# Patient Record
Sex: Male | Born: 1940
Health system: Southern US, Community
[De-identification: ages and names within clinical notes are randomized; demographics above are authoritative.]

## PROBLEM LIST (undated history)

## (undated) DIAGNOSIS — C801 Malignant (primary) neoplasm, unspecified: Secondary | ICD-10-CM

## (undated) DIAGNOSIS — I10 Essential (primary) hypertension: Secondary | ICD-10-CM

## (undated) DIAGNOSIS — G4730 Sleep apnea, unspecified: Secondary | ICD-10-CM

## (undated) DIAGNOSIS — I4891 Unspecified atrial fibrillation: Secondary | ICD-10-CM

## (undated) DIAGNOSIS — M109 Gout, unspecified: Secondary | ICD-10-CM

## (undated) DIAGNOSIS — K819 Cholecystitis, unspecified: Secondary | ICD-10-CM

## (undated) DIAGNOSIS — E785 Hyperlipidemia, unspecified: Secondary | ICD-10-CM

## (undated) DIAGNOSIS — G629 Polyneuropathy, unspecified: Secondary | ICD-10-CM

## (undated) DIAGNOSIS — N200 Calculus of kidney: Secondary | ICD-10-CM

## (undated) HISTORY — PX: SKIN CANCER EXCISION: SHX779

## (undated) HISTORY — DX: Gout, unspecified: M10.9

## (undated) HISTORY — DX: Unspecified atrial fibrillation: I48.91

## (undated) HISTORY — PX: WISDOM TOOTH EXTRACTION: SHX21

## (undated) HISTORY — DX: Malignant (primary) neoplasm, unspecified: C80.1

## (undated) HISTORY — DX: Calculus of kidney: N20.0

## (undated) HISTORY — DX: Essential (primary) hypertension: I10

## (undated) HISTORY — DX: Hyperlipidemia, unspecified: E78.5

## (undated) HISTORY — DX: Sleep apnea, unspecified: G47.30

## (undated) HISTORY — DX: Polyneuropathy, unspecified: G62.9

---
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
## 1972-12-20 HISTORY — PX: HERNIA REPAIR: SHX51

## 2008-09-09 DIAGNOSIS — M6740 Ganglion, unspecified site: Secondary | ICD-10-CM | POA: Insufficient documentation

## 2011-04-13 DIAGNOSIS — C44211 Basal cell carcinoma of skin of unspecified ear and external auricular canal: Secondary | ICD-10-CM | POA: Insufficient documentation

## 2011-04-13 DIAGNOSIS — K5730 Diverticulosis of large intestine without perforation or abscess without bleeding: Secondary | ICD-10-CM | POA: Insufficient documentation

## 2014-05-14 DIAGNOSIS — D6859 Other primary thrombophilia: Secondary | ICD-10-CM | POA: Insufficient documentation

## 2014-05-14 DIAGNOSIS — H35039 Hypertensive retinopathy, unspecified eye: Secondary | ICD-10-CM | POA: Insufficient documentation

## 2014-05-14 DIAGNOSIS — Z859 Personal history of malignant neoplasm, unspecified: Secondary | ICD-10-CM | POA: Insufficient documentation

## 2014-05-14 DIAGNOSIS — M109 Gout, unspecified: Secondary | ICD-10-CM | POA: Insufficient documentation

## 2014-06-25 ENCOUNTER — Encounter: Payer: Self-pay | Admitting: Neurology

## 2014-06-25 ENCOUNTER — Ambulatory Visit (INDEPENDENT_AMBULATORY_CARE_PROVIDER_SITE_OTHER): Payer: 59 | Admitting: Neurology

## 2014-06-25 ENCOUNTER — Encounter (INDEPENDENT_AMBULATORY_CARE_PROVIDER_SITE_OTHER): Payer: Self-pay

## 2014-06-25 VITALS — BP 181/101 | HR 58 | Temp 97.3°F | Ht 69.0 in | Wt 270.0 lb

## 2014-06-25 DIAGNOSIS — Z7289 Other problems related to lifestyle: Secondary | ICD-10-CM

## 2014-06-25 DIAGNOSIS — E669 Obesity, unspecified: Secondary | ICD-10-CM

## 2014-06-25 DIAGNOSIS — Z789 Other specified health status: Secondary | ICD-10-CM

## 2014-06-25 DIAGNOSIS — G4733 Obstructive sleep apnea (adult) (pediatric): Secondary | ICD-10-CM

## 2014-06-25 DIAGNOSIS — G609 Hereditary and idiopathic neuropathy, unspecified: Secondary | ICD-10-CM

## 2014-06-25 NOTE — Progress Notes (Signed)
Subjective:    Patient ID: Tyler Sparks is a 73 y.o. male.  HPI   Star Age, MD, PhD [facility] [address], [address].O[address] [place], [place] [address]  Dear Dr. Ardeth Perfect,   I saw your patient, Tyler Sparks, upon your kind request in my neurologic clinic today for initial consultation of his sleep disorder as well as neuropathy. The patient is unaccompanied today. As you know, Tyler Sparks is a 73 year old right-handed gentleman with an underlying medical history of A. fib, hypertension, hyperlipidemia, prediabetes, skin cancer, gout, kidney stones, neuropathy in the context of daily alcohol use (2 to 3 shot glasses per day), obesity and hypertensive retinopathy, who was diagnosed with obstructive sleep apnea at least 10 years ago, and has a CPAP machine. He relocated recently from [facility]. He was also diagnosed with peripheral neuropathy over 10 years ago and had workup when he was still in [place]. He had a CPAP in the beginning, for what he describes as severe OSA. He has been using a F&F nasal mask. He has been on a VPAP auto 25 from [place], set at 5-9 with PS of 4.  He has had paresthesias and numbness in his feet up to the ankles for the past 10 years. He has no pain, but reports that his balance is affected.  He has not had an EMG/NCV. He does have a FHx of neuropathy in his mother and a cousin, he has no siblings.  His has not been re-evaluated for OSA in over 10 years. His mask has not been replaced in 1 year. I reviewed the patient's CPAP compliance data from 12/25/13 to 06/24/14, which is a total of 182 days, during which time the patient used CPAP every day except for 2 days. The average usage for all days was 6 hours and 43 minutes. The percent used days greater than 4 hours was 98%, indicating excellent compliance. The residual AHI was 17.4 per hour, indicating an inadequate treatment setting of 5 cm of min EPAP and 25 cm of max IPAP  with PS of 4. Air leak from the mask was acceptable.  His typical bedtime is reported to be around 11 to 12 MN and usual wake time is around 6:30 to 7 AM. Sleep onset typically occurs within 15 minutes. He reports feeling marginally rested upon awakening. He does not have nocturia on a night to night basis. He does not have AM HAs.   He reports excessive daytime somnolence (EDS) and His Epworth Sleepiness Score (ESS) is 5/24 today. He has not fallen asleep while driving. The patient has not been taking a scheduled nap.  His wife sleeps in a separate bedroom. The patient has not noted any RLS symptoms and is not known to kick while asleep or before falling asleep. There is no family history of RLS or OSA. He denies cataplexy, sleep paralysis, hypnagogic or hypnopompic hallucinations, or sleep attacks. He does not report any vivid dreams, nightmares, dream enactments, or parasomnias, such as sleep talking or sleep walking.  He consumes 0 caffeinated beverages per day, There is a TV in the bedroom and usually it is not on at night.   His Past Medical History Is Significant For: Past Medical History  Diagnosis Date  . HTN (hypertension)   . Hyperlipidemia   . Atrial fibrillation   . Cancer     His Past Surgical History Is Significant For: Past Surgical History  Procedure Laterality Date  . Skin cancer excision  2008  . Hernia repair  1974  . Tonsilectomy/adenoidectomy with myringotomy  1950    His Family History Is Significant For: Family History  Problem Relation Age of Onset  . Heart failure Mother   . Heart failure Father     His Social History Is Significant For: History   Social History  . Marital Status: Married    Spouse Name: N/A    Number of Children: N/A  . Years of Education: N/A   Social History Main Topics  . Smoking status: Former Smoker    Quit date: 12/26/1973  . Smokeless tobacco: None  . Alcohol Use: 7.0 oz/week    14 drink(s) per week  . Drug Use: No  .  Sexual Activity: None   Other Topics Concern  . None   Social History Narrative  . None    His Allergies Are:  Allergies  Allergen Reactions  . Eggs Or Egg-Derived Products Hives, Itching and Swelling  . Peanuts [Peanut Oil]   :   His Current Medications Are:  Outpatient Encounter Prescriptions as of 06/25/2014  Medication Sig  . allopurinol (ZYLOPRIM) 300 MG tablet Take 300 mg by mouth daily.  Marland Kitchen aspirin 81 MG tablet Take 81 mg by mouth daily.  Marland Kitchen atenolol (TENORMIN) 25 MG tablet Take 25 mg by mouth daily.  Marland Kitchen atorvastatin (LIPITOR) 10 MG tablet Take 10 mg by mouth daily.  Marland Kitchen co-enzyme Q-10 50 MG capsule Take 50 mg by mouth daily.  . colchicine 0.6 MG tablet Take 0.6 mg by mouth as needed.  . diltiazem (CARDIZEM CD) 180 MG 24 hr capsule Take 180 mg by mouth daily.  Marland Kitchen lisinopril (PRINIVIL,ZESTRIL) 20 MG tablet Take 20 mg by mouth daily.  . Misc Natural Products (OSTEO BI-FLEX JOINT SHIELD PO) Take 2 tablets by mouth daily.  . Multiple Vitamin (MULTIVITAMIN) tablet Take 1 tablet by mouth daily.  . Omega-3 Fatty Acids (FISH OIL) 1000 MG CAPS Take 1 capsule by mouth daily.  . valsartan-hydrochlorothiazide (DIOVAN-HCT) 320-12.5 MG per tablet Take 1 tablet by mouth daily.  . vitamin C (ASCORBIC ACID) 500 MG tablet Take 500 mg by mouth daily.  . vitamin E 1000 UNIT capsule Take 1,000 Units by mouth daily.  :  Review of Systems:  Out of a complete 14 point review of systems, all are reviewed and negative with the exception of these symptoms as listed below:   Review of Systems  Constitutional: Negative.   HENT: Negative.   Eyes: Negative.   Respiratory: Negative.   Cardiovascular: Negative.   Gastrointestinal: Negative.   Endocrine: Negative.   Genitourinary: Negative.   Musculoskeletal: Negative.   Skin: Negative.   Allergic/Immunologic: Positive for environmental allergies and food allergies.  Neurological: Negative.   Hematological: Negative.   Psychiatric/Behavioral:  Positive for sleep disturbance (snoring).    Objective:  Neurologic Exam  Physical Exam Physical Examination:   Filed Vitals:   06/25/14 1017  BP: 181/101  Pulse: 58  Temp: 97.3 F (36.3 C)    General Examination: The patient is a very pleasant 73 y.o. male in no acute distress. He appears well-developed and well-nourished and well groomed. He is obese.   HEENT: Normocephalic, atraumatic, pupils are equal, round and reactive to light and accommodation. Funduscopic exam is normal with sharp disc margins noted. Extraocular tracking is good without limitation to gaze excursion or nystagmus noted. Normal smooth pursuit is noted. Hearing is grossly intact. Tympanic membranes are clear bilaterally. Face is symmetric with normal facial animation and  normal facial sensation. Speech is clear with no dysarthria noted. There is no hypophonia. There is no lip, neck/head, jaw or voice tremor. Neck is supple with full range of passive and active motion. There are no carotid bruits on auscultation. Oropharynx exam reveals: moderate mouth dryness, adequate dental hygiene and moderate airway crowding, due to larger tone and narrow airway entry. Mallampati is class III. Tongue protrudes centrally and palate elevates symmetrically. Tonsils are absent. Neck size is 18 inches. He has a Mild overbite. Nasal inspection reveals no significant nasal mucosal bogginess or redness and no septal deviation. He has some scars on his nasal bridge.  Chest: Clear to auscultation without wheezing, rhonchi or crackles noted.  Heart: S1+S2+0, regular and normal without murmurs, rubs or gallops noted.   Abdomen: Soft, non-tender and non-distended with normal bowel sounds appreciated on auscultation.  Extremities: There is 1+ pitting edema in the distal lower extremities bilaterally. Pedal pulses are intact.  Skin: Warm and dry without trophic changes noted. There are no varicose veins.  Musculoskeletal: exam reveals no  obvious joint deformities, tenderness or joint swelling or erythema.   Neurologically:  Mental status: The patient is awake, alert and oriented in all 4 spheres. His immediate and remote memory, attention, language skills and fund of knowledge are appropriate. There is no evidence of aphasia, agnosia, apraxia or anomia. Speech is clear with normal prosody and enunciation. Thought process is linear. Mood is normal and affect is normal.  Cranial nerves II - XII are as described above under HEENT exam. In addition: shoulder shrug is normal with equal shoulder height noted. Motor exam: Normal bulk, strength and tone is noted. There is no drift, tremor or rebound. Romberg is showing significant swaying but no corrective steps. Reflexes are 2+ in the upper extremities, 1+ in the knees and absent in the ankles. Babinski: Toes are flexor bilaterally. Fine motor skills and coordination: intact with normal finger taps, normal hand movements, normal rapid alternating patting, normal foot taps and normal foot agility.  Cerebellar testing: No dysmetria or intention tremor on finger to nose testing. Heel to shin is unremarkable bilaterally. There is no truncal or gait ataxia.  Sensory exam: intact to light touch, pinprick, vibration, temperature sense in the upper extremities but he has decreased sensation to light touch and particular pinprick and vibration sense in the distal lower extremities symmetrically up to distal one third of his shins,.  Gait, station and balance: He stands easily. No veering to one side is noted. No leaning to one side is noted. Posture is age-appropriate and stance is slightly wide-based. Gait shows normal stride length and normal pace. No problems turning are noted. He turns en bloc. Tandem walk is  Not possible  without corrective steps. He has difficulty with heel stance but has normal toe stance.               Assessment and Plan:  In summary, Tyler Sparks is a very pleasant 73  y.o.-year old male with an underlying medical history of A. fib, hypertension, hyperlipidemia, prediabetes, skin cancer, gout, kidney stones, neuropathy in the context of daily alcohol use (2 to 3 shot glasses per day), obesity and hypertensive retinopathy, who was diagnosed with obstructive sleep apnea at least 10 years ago, and has a CPAP machine. with a history and physical exam concerning for obstructive sleep apnea (OSA). He is currently on an auto VPAP but has residual significant sleep apnea. He has been compliant with the usage of his PAP  machine. In addition, he has evidence of peripheral neuropathy, this could be due to alcohol use. I've talked to him at length about this. Alcohol can also cause balance problems in the context of cerebellar problems and cerebellar degeneration. We will consider a brain MRI down the [place]. As of right now we will do additional blood work to look for treatable causes of neuropathy an EMG nerve conduction testing. Furthermore, he would benefit from a reevaluation with a sleep study and adjustment of treatment settings. I had a long chat with the patient about my findings and the diagnosis of OSA, its prognosis and treatment options. We talked about medical treatments, surgical interventions and non-pharmacological approaches. I explained in particular the risks and ramifications of untreated moderate to severe OSA, especially with respect to developing cardiovascular disease down the Road, including congestive heart failure, difficult to treat hypertension, cardiac arrhythmias, or stroke. Even type 2 diabetes has, in part, been linked to untreated OSA. Symptoms of untreated OSA include daytime sleepiness, memory problems, mood irritability and mood disorder such as depression and anxiety, lack of energy, as well as recurrent headaches, especially morning headaches. We talked about EtOH cessation and trying to maintain a healthy lifestyle in general, as well as the importance  of weight control. I encouraged the patient to eat healthy, exercise daily and keep well hydrated, to keep a scheduled bedtime and wake time routine, to not skip any meals and eat healthy snacks in between meals. I advised the patient not to drive when feeling sleepy. I answered all his questions today and the patient was in agreement. I would like to see him back after the tests are completed and encouraged him to call with any interim questions, concerns, problems or updates.   Thank you very much for allowing me to participate in the care of this nice patient. If I can be of any further assistance to you please do not hesitate to call me at [phone].  Sincerely,   Star Age, MD, PhD

## 2014-06-25 NOTE — Patient Instructions (Signed)
We will do further workup for your neuropathy.  Please reduce and eliminate your alcohol use as it may be the cause of your balance problem and your neuropathy.  Based on your symptoms you will benefit from re-evaluation of your obstructive sleep apnea or OSA, and I think we should proceed with a sleep study to determine whether you do or do not have OSA and how severe it is. If you have more than mild OSA, I want you to consider treatment with CPAP. Please remember, the risks and ramifications of moderate to severe obstructive sleep apnea or OSA are: Cardiovascular disease, including congestive heart failure, stroke, difficult to control hypertension, arrhythmias, and even type 2 diabetes has been linked to untreated OSA. Sleep apnea causes disruption of sleep and sleep deprivation in most cases, which, in turn, can cause recurrent headaches, problems with memory, mood, concentration, focus, and vigilance. Most people with untreated sleep apnea report excessive daytime sleepiness, which can affect their ability to drive. Please do not drive if you feel sleepy.  I will see you back after your sleep study to go over the test results and where to go from there. We will call you after your sleep study and to set up an appointment at the time.

## 2014-06-26 ENCOUNTER — Ambulatory Visit (INDEPENDENT_AMBULATORY_CARE_PROVIDER_SITE_OTHER): Payer: 59 | Admitting: Cardiovascular Disease

## 2014-06-26 ENCOUNTER — Encounter: Payer: Self-pay | Admitting: Cardiovascular Disease

## 2014-06-26 VITALS — BP 190/105 | HR 63 | Resp 12 | Ht 69.0 in | Wt 270.0 lb

## 2014-06-26 DIAGNOSIS — I4891 Unspecified atrial fibrillation: Secondary | ICD-10-CM

## 2014-06-26 DIAGNOSIS — I480 Paroxysmal atrial fibrillation: Secondary | ICD-10-CM

## 2014-06-26 NOTE — Patient Instructions (Signed)
Your physician wants you to follow-up in:  12 months.  You will receive a reminder letter in the mail two months in advance. If you don't receive a letter, please call our office to schedule the follow-up appointment.   

## 2014-06-26 NOTE — Progress Notes (Signed)
History of Present Illness: 73 yo male with history of paroxysmal atrial fibrillation, HTN, HLD, gout, OSA here today to establish cardiology care. He has recently moved to [place] to be near family. He had onset of atrial fib with RVR in 2008 and was converted to sinus with IV diltiazem. He has maintained sinus since then on po Cardizem. He has not been on long term anticoagulation (CHADSVASC 2). He has been followed by cardiology in [place].   Primary Care Physician: Velna Hatchet   Past Medical History  Diagnosis Date  . HTN (hypertension)   . Hyperlipidemia   . Atrial fibrillation     2008  . Cancer     Past Surgical History  Procedure Laterality Date  . Skin cancer excision  2008  . Hernia repair  1974  . Tonsilectomy/adenoidectomy with myringotomy  1950    Current Outpatient Prescriptions  Medication Sig Dispense Refill  . allopurinol (ZYLOPRIM) 300 MG tablet Take 300 mg by mouth daily.      Marland Kitchen aspirin 81 MG tablet Take 81 mg by mouth daily.      Marland Kitchen atenolol (TENORMIN) 25 MG tablet Take 25 mg by mouth daily.      Marland Kitchen atorvastatin (LIPITOR) 10 MG tablet Take 10 mg by mouth daily.      Marland Kitchen co-enzyme Q-10 50 MG capsule Take 50 mg by mouth daily.      . colchicine 0.6 MG tablet Take 0.6 mg by mouth as needed.      . diltiazem (CARDIZEM CD) 180 MG 24 hr capsule Take 180 mg by mouth daily.      Marland Kitchen lisinopril (PRINIVIL,ZESTRIL) 20 MG tablet Take 20 mg by mouth daily.      . Misc Natural Products (OSTEO BI-FLEX JOINT SHIELD PO) Take 2 tablets by mouth daily.      . Multiple Vitamin (MULTIVITAMIN) tablet Take 1 tablet by mouth daily.      . Omega-3 Fatty Acids (FISH OIL) 1000 MG CAPS Take 1 capsule by mouth daily.      . valsartan-hydrochlorothiazide (DIOVAN-HCT) 320-12.5 MG per tablet Take 1 tablet by mouth daily.      . vitamin C (ASCORBIC ACID) 500 MG tablet Take 500 mg by mouth daily.      . vitamin E 1000 UNIT capsule Take 1,000 Units by mouth daily.       No current  facility-administered medications for this visit.    Allergies  Allergen Reactions  . Eggs Or Egg-Derived Products Hives, Itching and Swelling  . Peanuts [Peanut Oil]     History   Social History  . Marital Status: Married    Spouse Name: N/A    Number of Children: N/A  . Years of Education: N/A   Occupational History  . Not on file.   Social History Main Topics  . Smoking status: Former Smoker    Quit date: 12/26/1973  . Smokeless tobacco: Not on file  . Alcohol Use: 7.0 oz/week    14 drink(s) per week  . Drug Use: No  . Sexual Activity: Not on file   Other Topics Concern  . Not on file   Social History Narrative  . No narrative on file    Family History  Problem Relation Age of Onset  . Heart failure Mother   . Heart failure Father     Review of Systems:  As stated in the HPI and otherwise negative.   BP 208/106  Pulse 63  Ht  5\' 9"  (1.753 m)  Wt 270 lb (122.471 kg)  BMI 39.85 kg/m2  Physical Examination: General: Well developed, well nourished, NAD HEENT: OP clear, mucus membranes moist SKIN: warm, dry. No rashes. Neuro: No focal deficits Musculoskeletal: Muscle strength 5/5 all ext Psychiatric: Mood and affect normal Neck: No JVD, no carotid bruits, no thyromegaly, no lymphadenopathy. Lungs:Clear bilaterally, no wheezes, rhonci, crackles Cardiovascular: Regular rate and rhythm. No murmurs, gallops or rubs. Abdomen:Soft. Bowel sounds present. Non-tender.  Extremities: No lower extremity edema. Pulses are 2 + in the bilateral DP/PT.  EKG: NSR, rate 63 bpm.   Assessment and Plan:   1. Paroxysmal atrial fibrillation: He has had no recurrence of atrial fib since 2008. Only documented episode. No palpitations. His CHADSVASC score is 2. He remains in sinus on atenolol and diltiazem. I agree that he does not need anti-coagulation unless he begins to have recurrent episodes of atrial fib. NO changes today. Will discuss arranging at echo at f/u visit.

## 2014-06-27 LAB — IFE AND PE, SERUM
ALPHA 1: 0.2 g/dL (ref 0.1–0.4)
ALPHA2 GLOB SERPL ELPH-MCNC: 0.7 g/dL (ref 0.4–1.2)
Albumin SerPl Elph-Mcnc: 3.4 g/dL (ref 3.2–5.6)
Albumin/Glob SerPl: 1.4 (ref 0.7–2.0)
B-Globulin SerPl Elph-Mcnc: 1 g/dL (ref 0.6–1.3)
Gamma Glob SerPl Elph-Mcnc: 0.8 g/dL (ref 0.5–1.6)
Globulin, Total: 2.6 g/dL (ref 2.0–4.5)
IGM (IMMUNOGLOBULIN M), SRM: 45 mg/dL (ref 40–230)
IgA/Immunoglobulin A, Serum: 195 mg/dL (ref 91–414)
IgG (Immunoglobin G), Serum: 685 mg/dL — ABNORMAL LOW (ref 700–1600)

## 2014-06-27 LAB — CBC WITH DIFFERENTIAL
BASOS ABS: 0 10*3/uL (ref 0.0–0.2)
Basos: 0 %
EOS ABS: 0.2 10*3/uL (ref 0.0–0.4)
Eos: 4 %
HCT: 40.4 % (ref 37.5–51.0)
HEMOGLOBIN: 13.6 g/dL (ref 12.6–17.7)
IMMATURE GRANS (ABS): 0 10*3/uL (ref 0.0–0.1)
Immature Granulocytes: 0 %
LYMPHS ABS: 1.4 10*3/uL (ref 0.7–3.1)
Lymphs: 30 %
MCH: 31.1 pg (ref 26.6–33.0)
MCHC: 33.7 g/dL (ref 31.5–35.7)
MCV: 92 fL (ref 79–97)
MONOS ABS: 0.5 10*3/uL (ref 0.1–0.9)
Monocytes: 10 %
Neutrophils Absolute: 2.6 10*3/uL (ref 1.4–7.0)
Neutrophils Relative %: 56 %
Platelets: 202 10*3/uL (ref 150–379)
RBC: 4.37 x10E6/uL (ref 4.14–5.80)
RDW: 14.4 % (ref 12.3–15.4)
WBC: 4.7 10*3/uL (ref 3.4–10.8)

## 2014-06-27 LAB — COMPREHENSIVE METABOLIC PANEL
A/G RATIO: 2.3 (ref 1.1–2.5)
ALBUMIN: 4.2 g/dL (ref 3.5–4.8)
ALT: 24 IU/L (ref 0–44)
AST: 15 IU/L (ref 0–40)
Alkaline Phosphatase: 66 IU/L (ref 39–117)
BILIRUBIN TOTAL: 0.5 mg/dL (ref 0.0–1.2)
BUN/Creatinine Ratio: 19 (ref 10–22)
BUN: 20 mg/dL (ref 8–27)
CALCIUM: 9.2 mg/dL (ref 8.6–10.2)
CO2: 26 mmol/L (ref 18–29)
CREATININE: 1.06 mg/dL (ref 0.76–1.27)
Chloride: 102 mmol/L (ref 97–108)
GFR, EST AFRICAN AMERICAN: 81 mL/min/{1.73_m2} (ref >59)
GFR, EST NON AFRICAN AMERICAN: 70 mL/min/{1.73_m2} (ref >59)
GLOBULIN, TOTAL: 1.8 g/dL (ref 1.5–4.5)
GLUCOSE: 109 mg/dL — AB (ref 65–99)
POTASSIUM: 4 mmol/L (ref 3.5–5.2)
Sodium: 144 mmol/L (ref 134–144)
TOTAL PROTEIN: 6 g/dL (ref 6.0–8.5)

## 2014-06-27 LAB — ANA W/REFLEX: Anti Nuclear Antibody(ANA): NEGATIVE

## 2014-06-27 LAB — METHYLMALONIC ACID, SERUM: METHYLMALONIC ACID: 284 nmol/L (ref 0–378)

## 2014-06-27 LAB — VITAMIN D 25 HYDROXY (VIT D DEFICIENCY, FRACTURES): Vit D, 25-Hydroxy: 48.3 ng/mL (ref 30.0–100.0)

## 2014-06-27 LAB — C-REACTIVE PROTEIN: CRP: 3 mg/L (ref 0.0–4.9)

## 2014-06-27 LAB — VITAMIN B1, WHOLE BLOOD: THIAMINE: 211.3 nmol/L — AB (ref 66.5–200.0)

## 2014-06-27 LAB — HEAVY METALS PROFILE II, BLOOD
Arsenic: 4 ug/L (ref 2–23)
CADMIUM: NOT DETECTED ug/L (ref 0.0–1.2)
LEAD, BLOOD: 1 ug/dL (ref 0–19)
Mercury: 3.2 ug/L (ref 0.0–14.9)

## 2014-06-27 LAB — SYPHILIS: RPR W/REFLEX TO RPR TITER AND TREPONEMAL ANTIBODIES, TRADITIONAL SCREENING AND DIAGNOSIS ALGORITHM: RPR: NONREACTIVE

## 2014-06-27 LAB — HOMOCYSTEINE: Homocysteine: 7.5 umol/L (ref 0.0–15.0)

## 2014-06-27 LAB — TSH: TSH: 2.6 u[IU]/mL (ref 0.450–4.500)

## 2014-06-27 LAB — RHEUMATOID FACTOR: Rhuematoid fact SerPl-aCnc: 49 IU/mL — ABNORMAL HIGH (ref 0.0–13.9)

## 2014-06-27 LAB — HGB A1C W/O EAG: Hgb A1c MFr Bld: 6 % — ABNORMAL HIGH (ref 4.8–5.6)

## 2014-06-27 NOTE — Progress Notes (Signed)
Quick Note:  Please call patient about his blood test results. His blood work looked normal for the most part. His glucose level was borderline elevated and so was his hemoglobin A1c which is a diabetes marker. He does have a history of prediabetes and this just underlines the possibility that he is borderline diabetic or at risk for diabetes but probably does not have frank diabetes. Otherwise, there was one rheumatological factor increase which is rheumatoid factor. This in isolation in the absence of any other rheumatological symptoms or lab test results positive for inflammation is not something we need to worry about acutely. His heavy metals were fine and his vitamin D level as well as his thyroid screen were normal. If he has significant issues with joint pain especially in the context of a family history of rheumatoid arthritis we can pursue his rheumatological workup further if need be.  Star Age, MD, PhD [facility]   ______

## 2014-07-01 NOTE — Progress Notes (Signed)
Quick Note:  I spoke to pt and relayed the results of labs. He did see in mychart already. Had question about RA factor, and I relayed note per Dr.Athar. He stated that he has seen rheumatologist in [place], [place] last 12/2013 due to similar test results done by his pcp in 11/2013. To let Dr.Athar know. Will forward to her. ______

## 2014-07-20 HISTORY — PX: ARM SKIN LESION BIOPSY / EXCISION: SUR471

## 2014-07-21 ENCOUNTER — Ambulatory Visit (INDEPENDENT_AMBULATORY_CARE_PROVIDER_SITE_OTHER): Payer: 59 | Admitting: Neurology

## 2014-07-21 DIAGNOSIS — G479 Sleep disorder, unspecified: Secondary | ICD-10-CM

## 2014-07-21 DIAGNOSIS — Z7289 Other problems related to lifestyle: Secondary | ICD-10-CM

## 2014-07-21 DIAGNOSIS — G4733 Obstructive sleep apnea (adult) (pediatric): Secondary | ICD-10-CM

## 2014-07-21 DIAGNOSIS — E669 Obesity, unspecified: Secondary | ICD-10-CM

## 2014-07-21 DIAGNOSIS — Z789 Other specified health status: Secondary | ICD-10-CM

## 2014-07-21 DIAGNOSIS — G609 Hereditary and idiopathic neuropathy, unspecified: Secondary | ICD-10-CM

## 2014-07-21 DIAGNOSIS — G4761 Periodic limb movement disorder: Secondary | ICD-10-CM

## 2014-07-22 ENCOUNTER — Encounter (INDEPENDENT_AMBULATORY_CARE_PROVIDER_SITE_OTHER): Payer: 59 | Admitting: Radiology

## 2014-07-22 ENCOUNTER — Ambulatory Visit (INDEPENDENT_AMBULATORY_CARE_PROVIDER_SITE_OTHER): Payer: 59 | Admitting: Neurology

## 2014-07-22 DIAGNOSIS — Z0289 Encounter for other administrative examinations: Secondary | ICD-10-CM

## 2014-07-22 DIAGNOSIS — G4733 Obstructive sleep apnea (adult) (pediatric): Secondary | ICD-10-CM

## 2014-07-22 DIAGNOSIS — G629 Polyneuropathy, unspecified: Secondary | ICD-10-CM

## 2014-07-22 DIAGNOSIS — G609 Hereditary and idiopathic neuropathy, unspecified: Secondary | ICD-10-CM

## 2014-07-22 DIAGNOSIS — Z7289 Other problems related to lifestyle: Secondary | ICD-10-CM

## 2014-07-22 DIAGNOSIS — E669 Obesity, unspecified: Secondary | ICD-10-CM

## 2014-07-22 DIAGNOSIS — Z789 Other specified health status: Secondary | ICD-10-CM

## 2014-07-22 NOTE — Sleep Study (Signed)
See media tab for full report  

## 2014-07-22 NOTE — Procedures (Signed)
   NCS (NERVE CONDUCTION STUDY) WITH EMG (ELECTROMYOGRAPHY) REPORT   STUDY DATE: August 3rd 2015 PATIENT NAME: Tyler Sparks DOB: [birth year] MRN: [id number]    TECHNOLOGIST: Towana Badger ELECTROMYOGRAPHER: Marcial Pacas M.D.  CLINICAL INFORMATION:   73 years old male, with past medical history of obesity, prediabetes, presenting with pain years history of bilateral feet paresthesia, stocking sensation  On examination, bilateral lower extremity motor strength was normal, length dependent decreased pinprick to above ankle level, absent vibratory sensation at toes  FINDINGS: NERVE CONDUCTION STUDY: Bilateral sural sensory responses showed mildly decreased SNAP amplitude. Bilateral peroneal, left tibial motor responses showed mildly decreased CMAP amplitude, with normal distal latency, mildly decreased conduction velocity.  Right tibial motor responses showed normal CMAP amplitude, with mild slow conduction velocity.   Left median, ulnar sensory and motor responses were normal.  NEEDLE ELECTROMYOGRAPHY: Selected needle examination was performed at right lower extremity muscles, right lumbosacral paraspinal muscles.  Right abductor hallucis longus: Increased insertion activity, 1 plus spontaneous activity, mild enlarged complex motor unit potential, with mildly decreased recruitment patterns.  Needle examination of right tibialis anterior, tibialis posterior, peroneal longus, medial gastrocnemius, vastus lateralis was normal  There was no spontaneous activity at right lumbosacral paraspinal muscles, right L4, L5, S1.  IMPRESSION:   This is an abnormal study. There is electrodiagnostic evidence of mild length dependent axonal peripheral neuropathy. There was no evidence of right lumbosacral radiculopathy   INTERPRETING PHYSICIAN:   Marcial Pacas M.D. Ph.D. [facility] [address], [place], [place] [address] [phone]

## 2014-07-24 NOTE — Progress Notes (Signed)
Quick Note:  EMG/NCV Confirms mild neuropathy. No other abnormalities were found. Please inform patient. Star Age, MD, PhD [facility]  ______

## 2014-08-02 ENCOUNTER — Telehealth: Payer: Self-pay | Admitting: Neurology

## 2014-08-02 DIAGNOSIS — G4733 Obstructive sleep apnea (adult) (pediatric): Secondary | ICD-10-CM

## 2014-08-02 NOTE — Telephone Encounter (Signed)
Please call and inform patient that I have entered an order for treatment with PAP. He did well during the sleep study with CPAP. I will prescribe a new machine for him. I will see the patient back in follow-up in about 6 weeks. Please also explain to the patient that I will be looking out for compliance data downloaded from the machine, which can be done remotely through a modem at times or stored on an SD card in the back of the machine. At the time of the followup appointment we will discuss sleep study results and how it is going with PAP treatment at home. Please advise patient to bring His machine at the time of the visit; at least for the first visit, even though this is cumbersome. Bringing the machine for every visit after that may not be needed, but often helps for the first visit. Please also make sure, the patient has a follow-up appointment with me in about 6 weeks from the setup date, thanks.   Star Age, MD, PhD [facility])

## 2014-08-05 ENCOUNTER — Encounter: Payer: Self-pay | Admitting: Neurology

## 2014-08-06 ENCOUNTER — Telehealth: Payer: Self-pay | Admitting: Neurology

## 2014-08-06 NOTE — Telephone Encounter (Signed)
Called pt to inform him that our office had called him concerning his EMG/NCV results on 07/24/14. Pt stated that he is also requesting his sleep study results. Please advise

## 2014-08-07 ENCOUNTER — Encounter: Payer: Self-pay | Admitting: *Deleted

## 2014-08-07 NOTE — Telephone Encounter (Signed)
I called and spoke with the patient has sleep study results. I informed the patient that he did well on CPAP during the night of his study. Dr. Rexene Alberts has placed an order for a new CPAP machine which will sent to [facility]. I will fax a copy of the report to Dr. Naoma Diener office and mail a copy to the patient along with a follow up instructions letter.

## 2014-09-11 ENCOUNTER — Encounter: Payer: Self-pay | Admitting: Neurology

## 2014-09-16 ENCOUNTER — Encounter: Payer: Self-pay | Admitting: Neurology

## 2014-10-06 NOTE — Progress Notes (Signed)
Quick Note:  I reviewed the patient's CPAP compliance data from 08/12/2014 to 09/10/2014, which is a total of 30 days, during which time the patient used CPAP every day. The average usage for all days was 7 hours and 6 minutes. The percent used days greater than 4 hours was 100 %, indicating superb compliance. The residual AHI was elevated at 9.2 per hour, indicating a suboptimal treatment pressure of 9 cwp. Air leak from the mask was very low with the 95th percentile at 0.2 L per minute. I will review this data with the patient at the next office visit, which is currently routinely scheduled for 10/07/2014 at 10:30 AM, provide feedback and additional troubleshooting if need be. We may need to increase his pressure.  Star Age, MD, PhD [facility] ([facility])   ______

## 2014-10-07 ENCOUNTER — Ambulatory Visit (INDEPENDENT_AMBULATORY_CARE_PROVIDER_SITE_OTHER): Payer: Medicare Other | Admitting: Neurology

## 2014-10-07 ENCOUNTER — Encounter: Payer: Self-pay | Admitting: Neurology

## 2014-10-07 VITALS — BP 166/80 | HR 51 | Temp 95.8°F | Ht 70.0 in | Wt 265.0 lb

## 2014-10-07 DIAGNOSIS — G629 Polyneuropathy, unspecified: Secondary | ICD-10-CM

## 2014-10-07 DIAGNOSIS — G4733 Obstructive sleep apnea (adult) (pediatric): Secondary | ICD-10-CM

## 2014-10-07 NOTE — Patient Instructions (Signed)
Please continue using your CPAP regularly. While your insurance requires that you use CPAP at least 4 hours each night on 70% of the nights, I recommend, that you not skip any nights and use it throughout the night if you can. Getting used to CPAP and staying with the treatment long term does take time and patience and discipline. Untreated obstructive sleep apnea when it is moderate to severe can have an adverse impact on cardiovascular health and raise her risk for heart disease, arrhythmias, hypertension, congestive heart failure, stroke and diabetes. Untreated obstructive sleep apnea causes sleep disruption, nonrestorative sleep, and sleep deprivation. This can have an impact on your day to day functioning and cause daytime sleepiness and impairment of cognitive function, memory loss, mood disturbance, and problems focussing. Using CPAP regularly can improve these symptoms.  Keep up the good work! I will see you back in 6 months for sleep apnea check up, and if you continue to do well on CPAP I will see you once a year thereafter.   We will increase your pressure to 10 cm and see how that goes.   Feel free to email me with questions through My Chart.

## 2014-10-07 NOTE — Progress Notes (Signed)
Subjective:    Patient ID: Tyler Sparks is a 73 y.o. male.  HPI    Interim history:   Tyler Sparks is a 73 year old right-handed gentleman with an underlying medical history of A. fib, hypertension, hyperlipidemia, prediabetes, skin cancer, gout, kidney stones, neuropathy, obesity and hypertensive retinopathy, who presents for followup consultation of his obstructive sleep apnea and neuropathy. He is unaccompanied today. I first met him on 06/25/2014 at the request of his primary care physician, at which time the patient reported a diagnosis of obstructive sleep apnea at least 10 years prior and he was using a CPAP machine. He was also diagnosed with peripheral neuropathy over 10 years ago and had workup when he was still residing in [place], [place]. He was on auto BiPAP. He reported paresthesias and numbness in his feet up to the ankles for the past 10+ years. He reported no pain. He complained of balance problems. He had not had an EMG or nerve conduction test. He reported a family history of neuropathy in his mother and a cousin. At the time of his first visit I suggested reevaluation of his obstructive sleep apnea and invited him back for sleep study. He also was advised to have an EMG nerve conduction test. He had this on and 07/22/2014, and it showed mild length-dependent axonal peripheral neuropathy. We also did blood work. This showed a normal TSH, normal vitamin D, normal ANA, normal CRP, normal RPR, his hemoglobin A1c was elevated at 6.0 which was borderline and his rheumatoid factor was elevated at 49. When we called him with his blood test results he indicated that he had seen a rheumatologist in [place] because of similar test results by his primary care physician at the time. He had a split-night sleep study on 07/21/2014 and went over his test results with him in detail today. Baseline sleep efficiency was reduced at 59.9% with a prolonged latency to sleep of 25 minutes and  wake after sleep onset of 30 minutes with moderate to severe sleep fragmentation noted. He had an elevated arousal index. He had an elevated percentage of light stage sleep and absence of slow-wave and REM sleep. He had occasional PVCs. He had moderate snoring. Total AHI was 56.3 per hour. Baseline oxygen saturation was 91%, nadir was 81%. He was started on CPAP of 5 cm and titrated up to 10 cm. His AHI was reduced to 0 per hour on a pressure of 9 cm. He achieved a sleep efficiency of 55%. Arousal index was improved. He had an increased percentage of stage I sleep, absence of slow-wave sleep and an increased percentage of REM sleep at 40.7%. Average oxygen saturation on CPAP was 92%, nadir was 86%. Snoring was eliminated. He was noted to have severe periodic leg movements of sleep after CPAP was started and moderate periodic leg movements before CPAP was started. He associated PLM arousal index was also mildly elevated.  I reviewed the patient's CPAP compliance data from 08/12/2014 to 09/10/2014, which is a total of 30 days, during which time the patient used CPAP every day. The average usage for all days was 7 hours and 6 minutes. The percent used days greater than 4 hours was 100 %, indicating superb compliance. The residual AHI was elevated at 9.2 per hour, indicating a suboptimal treatment pressure of 9 cwp. Air leak from the mask was very low with the 95th percentile at 0.2 L per minute.  Today, I reviewed his compliance data from 09/07/2014 through 10/06/2014 which is  a total of 30 days during which time he used his CPAP every day. Percent used days greater than 4 hours was 100%, indicating superb compliance, leak was low, average usage of 7 hours and 21 minutes, residual AHI at 8 per hour on a pressure of 9 cm. Today, he reports, that he has adjusted well to treatment. He feels improved with his sleep. He is fully compliant with treatment. He also feels improved and his balance. He has been exercising  regularly and has been working on his balance. He quit drinking alcohol altogether for about 6 weeks and did not notice any difference in his balance. He resumed drinking one to 2 drinks per day which has been the same from before.  He relocated from [place], [place]. He was also diagnosed with peripheral neuropathy over 10 years ago and had workup when he was still in [place]. He had a CPAP in the beginning, for what he describes as severe OSA. He has been using a F&F nasal mask. He has been on a VPAP auto 25 from [place], set at 5-9 with PS of 4.  He has had paresthesias and numbness in his feet up to the ankles for the past 10 years. He has no pain, but reports that his balance is affected.  He has not had an EMG/NCV. He does have a FHx of neuropathy in his mother and a cousin, he has no siblings.  I reviewed the patient's CPAP compliance data from 12/25/13 to 06/24/14, which is a total of 182 days, during which time the patient used CPAP every day except for 2 days. The average usage for all days was 6 hours and 43 minutes. The percent used days greater than 4 hours was 98%, indicating excellent compliance. The residual AHI was 17.4 per hour, indicating an inadequate treatment setting of 5 cm of min EPAP and 25 cm of max IPAP with PS of 4. Air leak from the mask was acceptable.  His Past Medical History Is Significant For: Past Medical History  Diagnosis Date  . HTN (hypertension)   . Hyperlipidemia   . Atrial fibrillation     2008  . Cancer   . Gout   . Nephrolithiasis   . Sleep apnea   . Peripheral neuropathy     His Past Surgical History Is Significant For: Past Surgical History  Procedure Laterality Date  . Skin cancer excision  2008, 07/2014  . Hernia repair  1974  . Tonsilectomy/adenoidectomy with myringotomy  1950  . Arm skin lesion biopsy / excision Right 8 15    His Family History Is Significant For: Family History  Problem Relation Age of Onset  . Heart attack Father   .  Heart attack Mother     His Social History Is Significant For: History   Social History  . Marital Status: Married    Spouse Name: N/A    Number of Children: 2  . Years of Education: N/A   Occupational History  . Retired-Linguistics Professor [employer]    Social History Main Topics  . Smoking status: Former Smoker    Quit date: 12/26/1973  . Smokeless tobacco: Never Used  . Alcohol Use: 7.0 oz/week    14 drink(s) per week     Comment: stopped for about 6 weeks, researched and resumed   . Drug Use: No  . Sexual Activity: None   Other Topics Concern  . None   Social History Narrative  . None    His Allergies Are:  Allergies  Allergen Reactions  . Eggs Or Egg-Derived Products Hives, Itching and Swelling  . Peanuts [Peanut Oil]   :   His Current Medications Are:  Outpatient Encounter Prescriptions as of 10/07/2014  Medication Sig  . allopurinol (ZYLOPRIM) 300 MG tablet Take 300 mg by mouth daily.  Marland Kitchen aspirin 81 MG tablet Take 81 mg by mouth daily.  Marland Kitchen atenolol (TENORMIN) 25 MG tablet Take 25 mg by mouth daily.  Marland Kitchen atorvastatin (LIPITOR) 10 MG tablet Take 10 mg by mouth daily.  Marland Kitchen co-enzyme Q-10 50 MG capsule Take 50 mg by mouth daily.  . colchicine 0.6 MG tablet Take 0.6 mg by mouth as needed.  . diltiazem (CARDIZEM CD) 180 MG 24 hr capsule Take 180 mg by mouth daily.  Marland Kitchen lisinopril (PRINIVIL,ZESTRIL) 20 MG tablet Take 20 mg by mouth daily.  . Misc Natural Products (OSTEO BI-FLEX JOINT SHIELD PO) Take 2 tablets by mouth daily.  . Multiple Vitamin (MULTIVITAMIN) tablet Take 1 tablet by mouth daily.  . Omega-3 Fatty Acids (FISH OIL) 1000 MG CAPS Take 1 capsule by mouth daily.  . valsartan-hydrochlorothiazide (DIOVAN-HCT) 320-12.5 MG per tablet Take 1 tablet by mouth daily.  . vitamin C (ASCORBIC ACID) 500 MG tablet Take 500 mg by mouth daily.  . vitamin E 1000 UNIT capsule Take 1,000 Units by mouth daily.  :  Review of Systems:  Out of a complete 14 point review of systems,  all are reviewed and negative with the exception of these symptoms as listed below:   Review of Systems  Allergic/Immunologic: Positive for food allergies.  Neurological:       Apnea    Objective:  Neurologic Exam  Physical Exam Physical Examination:   Filed Vitals:   10/07/14 1027  BP: 166/80  Pulse: 51  Temp: 95.8 F (35.4 C)    General Examination: The patient is a very pleasant 73 y.o. male in no acute distress. He appears well-developed and well-nourished and well groomed. He is obese.   HEENT: Normocephalic, atraumatic, pupils are equal, round and reactive to light and accommodation. Funduscopic exam is normal with sharp disc margins noted. Extraocular tracking is good without limitation to gaze excursion or nystagmus noted. Normal smooth pursuit is noted. Hearing is grossly intact. Face is symmetric with normal facial animation and normal facial sensation. Speech is clear with no dysarthria noted. There is no hypophonia. There is no lip, neck/head, jaw or voice tremor. Neck is supple with full range of passive and active motion. There are no carotid bruits on auscultation. Oropharynx exam reveals: moderate mouth dryness, adequate dental hygiene and moderate airway crowding, due to larger tone and narrow airway entry. Mallampati is class III. Tongue protrudes centrally and palate elevates symmetrically. Tonsils are absent. He has a Mild overbite. Nasal inspection reveals no significant nasal mucosal bogginess or redness and no septal deviation. He has some scars on his nasal bridge.   Chest: Clear to auscultation without wheezing, rhonchi or crackles noted.  Heart: S1+S2+0, regular and normal without murmurs, rubs or gallops noted.   Abdomen: Soft, non-tender and non-distended with normal bowel sounds appreciated on auscultation.  Extremities: There is 1+ pitting edema in the distal lower extremities bilaterally. Pedal pulses are intact.  Skin: Warm and dry without trophic  changes noted. There are no varicose veins.  Musculoskeletal: exam reveals no obvious joint deformities, tenderness or joint swelling or erythema.   Neurologically:  Mental status: The patient is awake, alert and oriented in all 4 spheres. His  immediate and remote memory, attention, language skills and fund of knowledge are appropriate. There is no evidence of aphasia, agnosia, apraxia or anomia. Speech is clear with normal prosody and enunciation. Thought process is linear. Mood is normal and affect is normal.  Cranial nerves II - XII are as described above under HEENT exam. In addition: shoulder shrug is normal with equal shoulder height noted. Motor exam: Normal bulk, strength and tone is noted. There is no drift, tremor or rebound. Romberg is showing significant swaying but no corrective steps. Reflexes are 2+ in the upper extremities, 1+ in the knees and absent in the ankles. Babinski: Toes are flexor bilaterally. Fine motor skills and coordination: intact with normal finger taps, normal hand movements, normal rapid alternating patting, normal foot taps and normal foot agility.  Cerebellar testing: No dysmetria or intention tremor on finger to nose testing. Heel to shin is unremarkable bilaterally. There is no truncal or gait ataxia.  Sensory exam: intact to light touch, pinprick, vibration, temperature sense in the upper extremities but he has decreased sensation to light touch and particular pinprick and vibration sense in the distal lower extremities symmetrically up to distal one third of his shins, unchanged. Gait, station and balance: He stands easily. No veering to one side is noted. No leaning to one side is noted. Posture is age-appropriate and stance is slightly wide-based. Gait shows normal stride length and normal pace. No problems turning are noted. He turns en bloc. Tandem walk is possible with mild difficulty, improved from last time.   Assessment and Plan:  In summary, Tyler Sparks is a very pleasant 73 year old male with an underlying medical history of A. fib, hypertension, hyperlipidemia, prediabetes, skin cancer, gout, kidney stones, and neuropathy in the context of daily alcohol use (2 to 3 shot glasses per day), obesity and hypertensive retinopathy, who presents for followup consultation of his severe obstructive sleep apnea now on treatment with CPAP at a pressure of 9 cm. His neuropathy is stable and his exam is stable as far as the neuropathy goes but his gait has improved and his tandem walk is improved. Workup was essentially benign with the exception of EMG and nerve conduction testing consistent with mild neuropathy. He previously had an elevated rheumatoid factor and workup was negative for underlying rheumatological disease. He is encouraged to work on his balance with his exercise regimen which has indeed helped. He is fully compliant with CPAP therapy. Residual AHI is elevated at 8 per hour. I would like to increase his pressure to 10 cm and we will fax disorder to his DME company. He is congratulated on his full compliance. We talked about his sleep study results in detail today as well as his compliance data. At this junction, I have asked him to be well hydrated, exercise regularly, and continue using his CPAP regularly. He does not endorse any RLS symptoms or leg twitching at night. He felt that this was unusual for him to have this much leg twitching. We will observe. I would like to see him back in 6 months. I would also like to see a 30 day download after we have increased his pressure to 10 cm. I answered all his questions today and the patient was in agreement. I encouraged him to call with any interim questions, concerns, problems or updates.

## 2014-10-15 ENCOUNTER — Encounter: Payer: Self-pay | Admitting: Neurology

## 2014-11-12 ENCOUNTER — Encounter: Payer: Self-pay | Admitting: Neurology

## 2014-11-25 ENCOUNTER — Encounter: Payer: Self-pay | Admitting: Internal Medicine

## 2015-02-25 ENCOUNTER — Ambulatory Visit (AMBULATORY_SURGERY_CENTER): Payer: Self-pay | Admitting: *Deleted

## 2015-02-25 VITALS — Ht 69.5 in | Wt 266.2 lb

## 2015-02-25 DIAGNOSIS — Z1211 Encounter for screening for malignant neoplasm of colon: Secondary | ICD-10-CM

## 2015-02-25 MED ORDER — MOVIPREP 100 G PO SOLR: 1.0000 | Freq: Once | ORAL | Status: DC

## 2015-02-25 NOTE — Progress Notes (Signed)
Patient does report allergies to eggs and egg products. Denies complications with sedation or anesthesia. Denies O2 use. Denies use of diet or weight loss medications.  Emmi instructions given for colonoscopy.

## 2015-03-11 ENCOUNTER — Ambulatory Visit (AMBULATORY_SURGERY_CENTER): Payer: Medicare Other | Admitting: Internal Medicine

## 2015-03-11 ENCOUNTER — Encounter: Payer: Self-pay | Admitting: Internal Medicine

## 2015-03-11 VITALS — BP 157/99 | HR 70 | Temp 95.9°F | Resp 19 | Ht 69.0 in | Wt 266.0 lb

## 2015-03-11 DIAGNOSIS — Z1211 Encounter for screening for malignant neoplasm of colon: Secondary | ICD-10-CM

## 2015-03-11 MED ORDER — SODIUM CHLORIDE 0.9 % IV SOLN: 500.0000 mL | INTRAVENOUS | Status: DC

## 2015-03-11 NOTE — Op Note (Signed)
[facility]  [address] [place], [id number]   COLONOSCOPY PROCEDURE REPORT  PATIENT: Tyler Sparks, Tyler Sparks  MR#: [id number] BIRTHDATE: 03/05/1941 , 73  yrs. old GENDER: male ENDOSCOPIST: Eustace Quail, MD REFERRED NK:[id number] Ardeth Perfect, M.D. PROCEDURE DATE:  03/11/2015 PROCEDURE:   Colonoscopy, screening First Screening Colonoscopy - Avg.  risk and is 50 yrs.  old or older - No.  Prior Negative Screening - Now for repeat screening. 10 or more years since last screening  History of Adenoma - Now for follow-up colonoscopy & has been > or = to 3 yrs.  N/A ASA CLASS:   Class II INDICATIONS:Screening for colonic neoplasia and Colorectal Neoplasm Risk Assessment for this procedure is average risk. Reports prior colonoscopy in [place], [place] 10+ yrs ago (diverticulosis) MEDICATIONS: Monitored anesthesia care, Propofol 200 mg IV, and Lidocaine 100 mg IV  DESCRIPTION OF PROCEDURE:   After the risks benefits and alternatives of the procedure were thoroughly explained, informed consent was obtained.  The digital rectal exam revealed no abnormalities of the rectum.   The [id number]  endoscope was introduced through the anus and advanced to the cecum, which was identified by both the appendix and ileocecal valve. No adverse events experienced.   The quality of the prep was excellent. (MoviPrep was used)  The instrument was then slowly withdrawn as the colon was fully examined.     COLON FINDINGS: There was moderate diverticulosis noted in the sigmoid colon.   The examination was otherwise normal. No polyps. Retroflexed views revealed internal hemorrhoids. The time to cecum = 1.2 Withdrawal time = 10.6   The scope was withdrawn and the procedure completed.  COMPLICATIONS: There were no immediate complications.  ENDOSCOPIC IMPRESSION: 1.   Moderate diverticulosis was noted in the sigmoid colon 2.   The examination was otherwise normal  RECOMMENDATIONS: 1. Return  to the care of your primary provider.  GI follow up as needed  eSigned:  Eustace Quail, MD 03/11/2015 9:26 AM   cc: The Patient and Velna Hatchet MD

## 2015-03-11 NOTE — Patient Instructions (Signed)
YOU HAD AN ENDOSCOPIC PROCEDURE TODAY AT [facility]:   Refer to the procedure report that was given to you for any specific questions about what was found during the examination.  If the procedure report does not answer your questions, please call your gastroenterologist to clarify.  If you requested that your care partner not be given the details of your procedure findings, then the procedure report has been included in a sealed envelope for you to review at your convenience later.  YOU SHOULD EXPECT: Some feelings of bloating in the abdomen. Passage of more gas than usual.  Walking can help get rid of the air that was put into your GI tract during the procedure and reduce the bloating. If you had a lower endoscopy (such as a colonoscopy or flexible sigmoidoscopy) you may notice spotting of blood in your stool or on the toilet paper. If you underwent a bowel prep for your procedure, you may not have a normal bowel movement for a few days.  Please Note:  You might notice some irritation and congestion in your nose or some drainage.  This is from the oxygen used during your procedure.  There is no need for concern and it should clear up in a day or so.  SYMPTOMS TO REPORT IMMEDIATELY:   Following lower endoscopy (colonoscopy or flexible sigmoidoscopy):  Excessive amounts of blood in the stool  Significant tenderness or worsening of abdominal pains  Swelling of the abdomen that is new, acute  Fever of 100F or higher    For urgent or emergent issues, a gastroenterologist can be reached at any hour by calling [phone].   DIET: Your first meal following the procedure should be a small meal and then it is ok to progress to your normal diet. Heavy or fried foods are harder to digest and may make you feel nauseous or bloated.  Likewise, meals heavy in dairy and vegetables can increase bloating.  Drink plenty of fluids but you should avoid alcoholic beverages for 24  hours.  ACTIVITY:  You should plan to take it easy for the rest of today and you should NOT DRIVE or use heavy machinery until tomorrow (because of the sedation medicines used during the test).    FOLLOW UP: Our staff will call the number listed on your records the next business day following your procedure to check on you and address any questions or concerns that you may have regarding the information given to you following your procedure. If we do not reach you, we will leave a message.  However, if you are feeling well and you are not experiencing any problems, there is no need to return our call.  We will assume that you have returned to your regular daily activities without incident.  If any biopsies were taken you will be contacted by phone or by letter within the next 1-3 weeks.  Please call us at [phone] if you have not heard about the biopsies in 3 weeks.    SIGNATURES/CONFIDENTIALITY: You and/or your care partner have signed paperwork which will be entered into your electronic medical record.  These signatures attest to the fact that that the information above on your After Visit Summary has been reviewed and is understood.  Full responsibility of the confidentiality of this discharge information lies with you and/or your care-partner.   Resume medications. Information given on diverticulosis and high fiber diet with discharge instructions.

## 2015-03-11 NOTE — Progress Notes (Signed)
Report to PACU, RN, vss, BBS= Clear.  

## 2015-03-12 ENCOUNTER — Telehealth: Payer: Self-pay | Admitting: *Deleted

## 2015-03-12 NOTE — Telephone Encounter (Signed)
  Follow up Call-  Call back number 03/11/2015  Post procedure Call Back phone  # [phone]  Permission to leave phone message Yes     Patient questions:  Do you have a fever, pain , or abdominal swelling? No. Pain Score  0 *  Have you tolerated food without any problems? Yes.    Have you been able to return to your normal activities? Yes.    Do you have any questions about your discharge instructions: Diet   No. Medications  No. Follow up visit  No.  Do you have questions or concerns about your Care? No.  Actions: * If pain score is 4 or above: No action needed, pain <4.

## 2015-04-08 ENCOUNTER — Ambulatory Visit (INDEPENDENT_AMBULATORY_CARE_PROVIDER_SITE_OTHER): Payer: Medicare Other | Admitting: Neurology

## 2015-04-08 ENCOUNTER — Encounter: Payer: Self-pay | Admitting: Neurology

## 2015-04-08 VITALS — BP 178/95 | HR 58 | Resp 18 | Ht 69.0 in | Wt 266.0 lb

## 2015-04-08 DIAGNOSIS — M722 Plantar fascial fibromatosis: Secondary | ICD-10-CM | POA: Diagnosis not present

## 2015-04-08 DIAGNOSIS — J302 Other seasonal allergic rhinitis: Secondary | ICD-10-CM | POA: Diagnosis not present

## 2015-04-08 DIAGNOSIS — G4733 Obstructive sleep apnea (adult) (pediatric): Secondary | ICD-10-CM | POA: Diagnosis not present

## 2015-04-08 DIAGNOSIS — G629 Polyneuropathy, unspecified: Secondary | ICD-10-CM

## 2015-04-08 DIAGNOSIS — Z9989 Dependence on other enabling machines and devices: Principal | ICD-10-CM

## 2015-04-08 NOTE — Patient Instructions (Signed)
Please look into using a salt water nasal rinse system, such as the [employer] and follow the directions and my instructions. It may help your nasal congestion and help you tolerate your CPAP.   Please continue using your CPAP regularly. While your insurance requires that you use CPAP at least 4 hours each night on 70% of the nights, I recommend, that you not skip any nights and use it throughout the night if you can. Getting used to CPAP and staying with the treatment long term does take time and patience and discipline. Untreated obstructive sleep apnea when it is moderate to severe can have an adverse impact on cardiovascular health and raise her risk for heart disease, arrhythmias, hypertension, congestive heart failure, stroke and diabetes. Untreated obstructive sleep apnea causes sleep disruption, nonrestorative sleep, and sleep deprivation. This can have an impact on your day to day functioning and cause daytime sleepiness and impairment of cognitive function, memory loss, mood disturbance, and problems focussing. Using CPAP regularly can improve these symptoms.

## 2015-04-08 NOTE — Progress Notes (Signed)
Subjective:    Patient ID: Tyler Sparks is a 74 y.o. male.  HPI     Interim history:   Tyler Sparks is a 74 year old right-handed gentleman with an underlying medical history of A. fib, hypertension, hyperlipidemia, prediabetes, skin cancer, gout, kidney stones, neuropathy, obesity and hypertensive retinopathy, who presents for followup consultation of his obstructive sleep apnea and neuropathy. He is unaccompanied today. I last saw him on 10/07/2014, at which time his AHI was suboptimal at 8 per hour on a pressure of 9 cm and I suggested we increase his pressure to 10 cm.  Today, 04/08/2015: I reviewed his CPAP compliance data from 03/08/2015 through 04/06/2015 which is a total of 30 days during which time he used his machine every night with percent used days greater than 4 hours at 97%, indicating excellent compliance with an average usage of 7 hours and 33 minutes and residual AHI is suboptimal at 12 per hour with leak low and the 95th percentile of leak at 4.9 L/m on a pressure of 10 cm.  Today, 04/08/2015: He reports having had more seasonal allergy symptoms. He has had a cough and post-nasal drip and allergies are worse again. When we look at the last 90 days, from 01/08/15 to 04/07/15 his AHI was 9.5/h, leak great, usage superb. He has developed L plantar fasciitis Sx. He has stopped walking bare foot in the house. He has had stable symptoms as far as his neuropathy.  Previously:  I first met him on 06/25/2014 at the request of his primary care physician, at which time the patient reported a diagnosis of obstructive sleep apnea at least 10 years prior and he was using a CPAP machine. He was also diagnosed with peripheral neuropathy over 10 years ago and had workup when he was still residing in [place], [place]. He was on auto BiPAP. He reported paresthesias and numbness in his feet up to the ankles for the past 10+ years. He reported no pain. He complained of balance problems. He  had not had an EMG or nerve conduction test. He reported a family history of neuropathy in his mother and a cousin. At the time of his first visit I suggested reevaluation of his obstructive sleep apnea and invited him back for sleep study. He also was advised to have an EMG nerve conduction test. He had this on and 07/22/2014, and it showed mild length-dependent axonal peripheral neuropathy. We also did blood work. This showed a normal TSH, normal vitamin D, normal ANA, normal CRP, normal RPR, his hemoglobin A1c was elevated at 6.0 which was borderline and his rheumatoid factor was elevated at 49. When we called him with his blood test results he indicated that he had seen a rheumatologist in [place] because of similar test results by his primary care physician at the time. He had a split-night sleep study on 07/21/2014 and went over his test results with him in detail today. Baseline sleep efficiency was reduced at 59.9% with a prolonged latency to sleep of 25 minutes and wake after sleep onset of 30 minutes with moderate to severe sleep fragmentation noted. He had an elevated arousal index. He had an elevated percentage of light stage sleep and absence of slow-wave and REM sleep. He had occasional PVCs. He had moderate snoring. Total AHI was 56.3 per hour. Baseline oxygen saturation was 91%, nadir was 81%. He was started on CPAP of 5 cm and titrated up to 10 cm. His AHI was reduced to 0 per hour  on a pressure of 9 cm. He achieved a sleep efficiency of 55%. Arousal index was improved. He had an increased percentage of stage I sleep, absence of slow-wave sleep and an increased percentage of REM sleep at 40.7%. Average oxygen saturation on CPAP was 92%, nadir was 86%. Snoring was eliminated. He was noted to have severe periodic leg movements of sleep after CPAP was started and moderate periodic leg movements before CPAP was started. He associated PLM arousal index was also mildly elevated.    I reviewed the  patient's CPAP compliance data from 08/12/2014 to 09/10/2014, which is a total of 30 days, during which time the patient used CPAP every day. The average usage for all days was 7 hours and 6 minutes. The percent used days greater than 4 hours was 100 %, indicating superb compliance. The residual AHI was elevated at 9.2 per hour, indicating a suboptimal treatment pressure of 9 cwp. Air leak from the mask was very low with the 95th percentile at 0.2 L per minute.  I reviewed his compliance data from 09/07/2014 through 10/06/2014 which is a total of 30 days during which time he used his CPAP every day. Percent used days greater than 4 hours was 100%, indicating superb compliance, leak was low, average usage of 7 hours and 21 minutes, residual AHI at 8 per hour on a pressure of 9 cm.  He relocated from [place], [place]. He was also diagnosed with peripheral neuropathy over 10 years ago and had workup when he was still in [place]. He had a CPAP in the beginning, for what he describes as severe OSA. He has been using a F&F nasal mask. He has been on a VPAP auto 25 from [place], set at 5-9 with PS of 4.   He has had paresthesias and numbness in his feet up to the ankles for the past 10 years. He has no pain, but reports that his balance is affected.   He has not had an EMG/NCV. He does have a FHx of neuropathy in his mother and a cousin, he has no siblings.   I reviewed the patient's CPAP compliance data from 12/25/13 to 06/24/14, which is a total of 182 days, during which time the patient used CPAP every day except for 2 days. The average usage for all days was 6 hours and 43 minutes. The percent used days greater than 4 hours was 98%, indicating excellent compliance. The residual AHI was 17.4 per hour, indicating an inadequate treatment setting of 5 cm of min EPAP and 25 cm of max IPAP with PS of 4. Air leak from the mask was acceptable.  His Past Medical History Is Significant For: Past Medical History  Diagnosis  Date  . HTN (hypertension)   . Hyperlipidemia   . Atrial fibrillation     2008  . Gout   . Nephrolithiasis   . Sleep apnea   . Peripheral neuropathy   . Cancer     skin    His Past Surgical History Is Significant For: Past Surgical History  Procedure Laterality Date  . Skin cancer excision  2008, 07/2014  . Hernia repair  1974  . Tonsilectomy/adenoidectomy with myringotomy  1950  . Arm skin lesion biopsy / excision Right 8 15  . Wisdom tooth extraction      His Family History Is Significant For: Family History  Problem Relation Age of Onset  . Heart attack Father   . Heart attack Mother   . Colon cancer Neg  Hx   . Esophageal cancer Neg Hx   . Rectal cancer Neg Hx   . Stomach cancer Neg Hx     His Social History Is Significant For: History   Social History  . Marital Status: Married    Spouse Name: N/A  . Number of Children: 2  . Years of Education: N/A   Occupational History  . Retired-Linguistics Professor [employer]    Social History Main Topics  . Smoking status: Former Smoker    Quit date: 12/26/1973  . Smokeless tobacco: Never Used  . Alcohol Use: 8.4 oz/week    14 Standard drinks or equivalent per week     Comment: stopped for about 6 weeks, researched and resumed   . Drug Use: No  . Sexual Activity: Not on file   Other Topics Concern  . None   Social History Narrative    His Allergies Are:  Allergies  Allergen Reactions  . Eggs Or Egg-Derived Products Hives, Itching and Swelling  . Peanuts [Peanut Oil]   :   His Current Medications Are:  Outpatient Encounter Prescriptions as of 04/08/2015  Medication Sig  . allopurinol (ZYLOPRIM) 300 MG tablet Take 300 mg by mouth daily.  Marland Kitchen aspirin 81 MG tablet Take 81 mg by mouth daily.  Marland Kitchen atenolol (TENORMIN) 25 MG tablet Take 25 mg by mouth daily.  Marland Kitchen atorvastatin (LIPITOR) 10 MG tablet Take 10 mg by mouth daily.  Marland Kitchen co-enzyme Q-10 50 MG capsule Take 50 mg by mouth daily.  . colchicine 0.6 MG tablet Take 0.6  mg by mouth as needed.  . diltiazem (CARDIZEM CD) 180 MG 24 hr capsule Take 180 mg by mouth daily.  Marland Kitchen lisinopril (PRINIVIL,ZESTRIL) 20 MG tablet Take 20 mg by mouth daily.  . Misc Natural Products (OSTEO BI-FLEX JOINT SHIELD PO) Take 2 tablets by mouth daily.  . Multiple Vitamin (MULTIVITAMIN) tablet Take 1 tablet by mouth daily.  . Omega-3 Fatty Acids (FISH OIL) 1000 MG CAPS Take 1 capsule by mouth daily.  . valsartan-hydrochlorothiazide (DIOVAN-HCT) 320-12.5 MG per tablet Take 1 tablet by mouth daily.  . vitamin C (ASCORBIC ACID) 500 MG tablet Take 500 mg by mouth daily.  . vitamin E 1000 UNIT capsule Take 1,000 Units by mouth daily.  :  Review of Systems:  Out of a complete 14 point review of systems, all are reviewed and negative with the exception of these symptoms as listed below:   Review of Systems  Neurological:       Patient would like to discuss CPAP results.     Objective:  Neurologic Exam  Physical Exam Physical Examination:   Filed Vitals:   04/08/15 1111  BP: 178/95  Pulse: 58  Resp: 18    General Examination: The patient is a very pleasant 74 y.o. male in no acute distress. He appears well-developed and well-nourished and well groomed. He is obese.   HEENT: Normocephalic, atraumatic, pupils are equal, round and reactive to light and accommodation. Funduscopic exam is normal with sharp disc margins noted. Extraocular tracking is good without limitation to gaze excursion or nystagmus noted. Normal smooth pursuit is noted. Hearing is grossly intact. Face is symmetric with normal facial animation and normal facial sensation. Speech is clear with no dysarthria noted. There is no hypophonia. There is no lip, neck/head, jaw or voice tremor. Neck is supple with full range of passive and active motion. There are no carotid bruits on auscultation. Oropharynx exam reveals: moderate mouth dryness, adequate dental hygiene and  moderate airway crowding, due to larger tone and  narrow airway entry. Mallampati is class III. Tongue protrudes centrally and palate elevates symmetrically. Tonsils are absent. He has a Mild overbite. Nasal inspection reveals no significant nasal mucosal bogginess or redness and no septal deviation. He has some scars on his nasal bridge.   Chest: Clear to auscultation without wheezing, rhonchi or crackles noted.  Heart: S1+S2+0, regular and normal without murmurs, rubs or gallops noted.   Abdomen: Soft, non-tender and non-distended with normal bowel sounds appreciated on auscultation.  Extremities: There is 1+ pitting edema in the distal lower extremities bilaterally, around the ankles. Pedal pulses are intact.  Skin: Warm and dry without trophic changes noted. There are no varicose veins. He has sun-exposure related changes on his scalp and actinic keratosis.   Musculoskeletal: exam reveals no obvious joint deformities, tenderness or joint swelling or erythema.   Neurologically:  Mental status: The patient is awake, alert and oriented in all 4 spheres. His immediate and remote memory, attention, language skills and fund of knowledge are appropriate. There is no evidence of aphasia, agnosia, apraxia or anomia. Speech is clear with normal prosody and enunciation. Thought process is linear. Mood is normal and affect is normal.  Cranial nerves II - XII are as described above under HEENT exam. In addition: shoulder shrug is normal with equal shoulder height noted. Motor exam: Normal bulk, strength and tone is noted. There is no drift, tremor or rebound. Romberg is showing significant swaying but no corrective steps. Reflexes are 2+ in the upper extremities, 1+ in the knees and absent in the ankles. Babinski: Toes are flexor bilaterally. Fine motor skills and coordination: intact with normal finger taps, normal hand movements, normal rapid alternating patting, normal foot taps and normal foot agility.  Cerebellar testing: No dysmetria or intention  tremor on finger to nose testing. Heel to shin is unremarkable bilaterally. There is no truncal or gait ataxia.  Sensory exam: intact to light touch, pinprick, vibration, temperature sense in the upper extremities but he has decreased sensation to light touch and particular pinprick and vibration sense in the distal lower extremities symmetrically up to distal one third of his shins, unchanged. Gait, station and balance: He stands easily. No veering to one side is noted. No leaning to one side is noted. Posture is age-appropriate and stance is slightly wide-based. Gait shows normal stride length and normal pace. No problems turning are noted. He turns en bloc. Tandem walk is possible with mild difficulty only in the beginning, improved from last time.   Assessment and Plan:  In summary, Tyler Sparks is a very pleasant 74 year old male with an underlying medical history of A. fib, hypertension, hyperlipidemia, prediabetes, skin cancer, gout, kidney stones, and neuropathy in the context of daily alcohol use, obesity and hypertensive retinopathy, who presents for followup consultation of his severe obstructive sleep apnea now on treatment with CPAP at a pressure of 10 cm. His neuropathy is stable and his exam is stable as far as the neuropathy goes but his gait has improved and his tandem walk is improved. Workup was essentially benign with the exception of EMG and nerve conduction testing consistent with mild neuropathy. He previously had an elevated rheumatoid factor and workup was negative for underlying rheumatological disease. He is encouraged to work on his balance with his exercise regimen which has indeed helped. He is fully compliant with CPAP therapy. Residual AHI is elevated at 12 per hour, and the increase in pressure to  10 cm may have helped in the long run but in the last 30 days did not look as good. Compliance is excellent. I think we should keep things the same. His flareup and allergy symptoms  may have something to do with the increase in AHI. His weight is about the same. He is advised to try a nasal saline rinse for his allergy symptoms and over-the-counter allergy medications. He is congratulated on his full compliance. We talked about his sleep study results again today and  discussed his compliance data for the last 30 days as well as the last 90 days. At this point, I have asked him to continue using his CPAP regularly at the current settings. He does not endorse any RLS symptoms or leg twitching at night. He felt that this was unusual for him to have this much leg twitching. We will observe. for his plantar fasciitis on the left he has used ice and an over-the-counter insert which have helped.  I would like to see him back in 6 months. I answered all his questions today and the patient was in agreement. I encouraged him to call with any interim questions, concerns, problems or updates.  I spent 25 minutes in total face-to-face time with the patient, more than 50% of which was spent in counseling and coordination of care, reviewing test results, reviewing medication and discussing or reviewing the diagnosis of OSA and neuropathy, the prognosis and treatment options.

## 2015-04-16 ENCOUNTER — Encounter: Payer: Self-pay | Admitting: Neurology

## 2015-06-08 ENCOUNTER — Encounter: Payer: Self-pay | Admitting: Neurology

## 2015-08-18 ENCOUNTER — Telehealth: Payer: Self-pay

## 2015-08-18 ENCOUNTER — Encounter: Payer: Self-pay | Admitting: Cardiovascular Disease

## 2015-08-18 ENCOUNTER — Ambulatory Visit (INDEPENDENT_AMBULATORY_CARE_PROVIDER_SITE_OTHER): Payer: Medicare Other | Admitting: Cardiovascular Disease

## 2015-08-18 VITALS — BP 150/90 | HR 71 | Ht 69.0 in | Wt 271.2 lb

## 2015-08-18 DIAGNOSIS — I480 Paroxysmal atrial fibrillation: Secondary | ICD-10-CM

## 2015-08-18 LAB — CBC WITH DIFFERENTIAL/PLATELET
Basophils Absolute: 0 10*3/uL (ref 0.0–0.1)
Basophils Relative: 0.6 % (ref 0.0–3.0)
EOS PCT: 2.9 % (ref 0.0–5.0)
Eosinophils Absolute: 0.1 10*3/uL (ref 0.0–0.7)
HCT: 43.4 % (ref 39.0–52.0)
HEMOGLOBIN: 14.6 g/dL (ref 13.0–17.0)
Lymphocytes Relative: 31.4 % (ref 12.0–46.0)
Lymphs Abs: 1.5 10*3/uL (ref 0.7–4.0)
MCHC: 33.6 g/dL (ref 30.0–36.0)
MCV: 94.5 fl (ref 78.0–100.0)
MONO ABS: 0.5 10*3/uL (ref 0.1–1.0)
MONOS PCT: 10.1 % (ref 3.0–12.0)
Neutro Abs: 2.7 10*3/uL (ref 1.4–7.7)
Neutrophils Relative %: 55 % (ref 43.0–77.0)
Platelets: 202 10*3/uL (ref 150.0–400.0)
RBC: 4.59 Mil/uL (ref 4.22–5.81)
RDW: 14.6 % (ref 11.5–15.5)
WBC: 4.9 10*3/uL (ref 4.0–10.5)

## 2015-08-18 LAB — BASIC METABOLIC PANEL
BUN: 31 mg/dL — AB (ref 6–23)
CO2: 31 mEq/L (ref 19–32)
Calcium: 9.5 mg/dL (ref 8.4–10.5)
Chloride: 105 mEq/L (ref 96–112)
Creatinine, Ser: 1.34 mg/dL (ref 0.40–1.50)
GFR: 55.51 mL/min — ABNORMAL LOW (ref >60.00)
Glucose, Bld: 108 mg/dL — ABNORMAL HIGH (ref 70–99)
Potassium: 3.4 mEq/L — ABNORMAL LOW (ref 3.5–5.1)
SODIUM: 143 meq/L (ref 135–145)

## 2015-08-18 MED ORDER — RIVAROXABAN 20 MG PO TABS: 20.0000 mg | ORAL_TABLET | Freq: Every day | ORAL | Status: DC

## 2015-08-18 NOTE — Telephone Encounter (Signed)
Prior auth for Xarelto 20mg  sent [facility] via [facility].

## 2015-08-18 NOTE — Patient Instructions (Addendum)
Medication Instructions:  Your physician has recommended you make the following change in your medication: Start Xarelto 20 mg by mouth daily with dinner. Stop aspirin   Labwork: Lab work to be done today--BMP, CBC  Testing/Procedures: Your physician has requested that you have an echocardiogram. Echocardiography is a painless test that uses sound waves to create images of your heart. It provides your doctor with information about the size and shape of your heart and how well your heart's chambers and valves are working. This procedure takes approximately one hour. There are no restrictions for this procedure.    Follow-Up: Your physician recommends that you schedule a follow-up appointment in: 8 weeks. Scheduled for October 15, 2015 at 9:15

## 2015-08-18 NOTE — Progress Notes (Signed)
Chief Complaint  Patient presents with  . Follow-up     History of Present Illness: 74 yo male with history of paroxysmal atrial fibrillation, HTN, HLD, gout, OSA here today for cardiac follow up. I met him in July 2015. He had just moved to [place] to be near family. He had onset of atrial fib with RVR in 2008 and was converted to sinus with IV diltiazem. He has maintained sinus since then on Cardizem and atenolol. He has not been on long term anticoagulation (CHADSVASC 2). He has been followed by cardiology in [place] before moving here.   He is here today for follow up. He is feeling well. No chest pain or SOB. No palpitations. In atrial fib today.   Primary Care Physician: Velna Hatchet   Past Medical History  Diagnosis Date  . HTN (hypertension)   . Hyperlipidemia   . Atrial fibrillation     2008  . Gout   . Nephrolithiasis   . Sleep apnea   . Peripheral neuropathy   . Cancer     skin    Past Surgical History  Procedure Laterality Date  . Skin cancer excision  2008, 07/2014  . Hernia repair  1974  . Tonsilectomy/adenoidectomy with myringotomy  1950  . Arm skin lesion biopsy / excision Right 8 15  . Wisdom tooth extraction      Current Outpatient Prescriptions  Medication Sig Dispense Refill  . allopurinol (ZYLOPRIM) 300 MG tablet Take 300 mg by mouth daily.    Marland Kitchen aspirin 81 MG tablet Take 81 mg by mouth daily.    Marland Kitchen atenolol (TENORMIN) 25 MG tablet Take 25 mg by mouth daily.    Marland Kitchen atorvastatin (LIPITOR) 10 MG tablet Take 10 mg by mouth daily.    Marland Kitchen co-enzyme Q-10 50 MG capsule Take 50 mg by mouth daily.    . colchicine 0.6 MG tablet Take 0.6 mg by mouth as needed.    . diltiazem (CARDIZEM CD) 180 MG 24 hr capsule Take 180 mg by mouth daily.    . hydrocortisone-pramoxine (ANALPRAM-HC) 2.5-1 % rectal cream Place 1 application rectally 3 (three) times daily.     Marland Kitchen lisinopril (PRINIVIL,ZESTRIL) 20 MG tablet Take 20 mg by mouth daily.    . Misc Natural Products  (OSTEO BI-FLEX JOINT SHIELD PO) Take 2 tablets by mouth daily.    . Multiple Vitamin (MULTIVITAMIN) tablet Take 1 tablet by mouth daily.    . Omega-3 Fatty Acids (FISH OIL) 1000 MG CAPS Take 1 capsule by mouth daily.    . valsartan-hydrochlorothiazide (DIOVAN-HCT) 320-12.5 MG per tablet Take 1 tablet by mouth daily.    . vitamin C (ASCORBIC ACID) 500 MG tablet Take 500 mg by mouth daily.    . vitamin E 1000 UNIT capsule Take 1,000 Units by mouth daily.     No current facility-administered medications for this visit.    Allergies  Allergen Reactions  . Peanuts [Peanut Oil] Anaphylaxis  . Eggs Or Egg-Derived Products Hives, Itching and Swelling    Social History   Social History  . Marital Status: Married    Spouse Name: N/A  . Number of Children: 2  . Years of Education: N/A   Occupational History  . Retired-Linguistics Professor [employer]    Social History Main Topics  . Smoking status: Former Smoker    Quit date: 12/26/1973  . Smokeless tobacco: Never Used  . Alcohol Use: 8.4 oz/week    14 Standard drinks or equivalent per week  Comment: stopped for about 6 weeks, researched and resumed   . Drug Use: No  . Sexual Activity: Not on file   Other Topics Concern  . Not on file   Social History Narrative    Family History  Problem Relation Age of Onset  . Heart attack Father   . Heart attack Mother   . Colon cancer Neg Hx   . Esophageal cancer Neg Hx   . Rectal cancer Neg Hx   . Stomach cancer Neg Hx     Review of Systems:  As stated in the HPI and otherwise negative.   BP 150/90 mmHg  Pulse 71  Ht 5' 9"  (1.753 m)  Wt 271 lb 3.2 oz (123.016 kg)  BMI 40.03 kg/m2  SpO2 94%  Physical Examination: General: Well developed, well nourished, NAD HEENT: OP clear, mucus membranes moist SKIN: warm, dry. No rashes. Neuro: No focal deficits Musculoskeletal: Muscle strength 5/5 all ext Psychiatric: Mood and affect normal Neck: No JVD, no carotid bruits, no thyromegaly,  no lymphadenopathy. Lungs:Clear bilaterally, no wheezes, rhonci, crackles Cardiovascular: Regular rate and rhythm. No murmurs, gallops or rubs. Abdomen:Soft. Bowel sounds present. Non-tender.  Extremities: No lower extremity edema. Pulses are 2 + in the bilateral DP/PT.  EKG:  EKG is ordered today. The ekg ordered today demonstrates Atrial fib, rate 71 bpm. PVC.   Recent Labs: No results found for requested labs within last 365 days.   Lipid Panel No results found for: CHOL, TRIG, HDL, CHOLHDL, VLDL, LDLCALC, LDLDIRECT   Wt Readings from Last 3 Encounters:  08/18/15 271 lb 3.2 oz (123.016 kg)  04/08/15 266 lb (120.657 kg)  03/11/15 266 lb (120.657 kg)     Other studies Reviewed: Additional studies/ records that were reviewed today include: . Review of the above records demonstrates:   Assessment and Plan:   1. Paroxysmal atrial fibrillation: He is back in atrial fib today. His CHADSVASC score is 2 (2.2% stroke risk per year). Discussed stroke risk with pt. Will start Xarelto 20 mg daily. CBC and BMET today. Will arrange echo.   Continue diltiazem and atenolol.   Current medicines are reviewed at length with the patient today.  The patient does not have concerns regarding medicines.  The following changes have been made:  Added Xarelto  Labs/ tests ordered today include: BMET, CBC No orders of the defined types were placed in this encounter.    Disposition:   FU with me in 8 weeks  Signed, Lauree Chandler, MD 08/18/2015 8:35 AM    [facility] [place], [place], [place]  [address] Phone: [phone]; Fax: [phone]

## 2015-08-19 ENCOUNTER — Telehealth: Payer: Self-pay

## 2015-08-19 NOTE — Telephone Encounter (Signed)
Xarelto approved per [facility]. PA# [id number]. Good for one year.

## 2015-10-09 ENCOUNTER — Encounter: Payer: Self-pay | Admitting: Neurology

## 2015-10-09 ENCOUNTER — Ambulatory Visit (INDEPENDENT_AMBULATORY_CARE_PROVIDER_SITE_OTHER): Payer: Medicare Other | Admitting: Neurology

## 2015-10-09 VITALS — BP 152/98 | HR 78 | Resp 18 | Ht 69.0 in | Wt 264.0 lb

## 2015-10-09 DIAGNOSIS — I480 Paroxysmal atrial fibrillation: Secondary | ICD-10-CM

## 2015-10-09 DIAGNOSIS — G4733 Obstructive sleep apnea (adult) (pediatric): Secondary | ICD-10-CM | POA: Diagnosis not present

## 2015-10-09 DIAGNOSIS — G609 Hereditary and idiopathic neuropathy, unspecified: Secondary | ICD-10-CM

## 2015-10-09 DIAGNOSIS — E669 Obesity, unspecified: Secondary | ICD-10-CM | POA: Diagnosis not present

## 2015-10-09 DIAGNOSIS — Z9989 Dependence on other enabling machines and devices: Principal | ICD-10-CM

## 2015-10-09 NOTE — Progress Notes (Signed)
Subjective:    Patient ID: Tyler Sparks is a 74 y.o. male.  HPI     Interim history:   Tyler Sparks is a 74 year old right-handed gentleman with an underlying medical history of A. fib, hypertension, hyperlipidemia, prediabetes, skin cancer, gout, kidney stones, neuropathy, obesity and hypertensive retinopathy, who presents for followup consultation of his obstructive sleep apnea and neuropathy. He is unaccompanied today. I last saw him on 04/08/2015, at which time he reported more seasonal allergy symptoms and cough as well as postnasal drip. He had issues with left-sided plantar fasciitis. He had stable symptoms of neuropathy.    Today, 10/09/2015: Reviewed his CPAP compliance data from 09/07/2015 through 10/06/2015 which is a total of 30 days during which time he used his machine every night with percent used days greater than 4 hours at 100%, indicating superb compliance with an average usage of 6 hours and 45 minutes, residual AHI high at 22.3 per hour, leak acceptable for the 95th percentile at 8.4 L/m on a pressure of 10 cm. Most of his residual sleep-disordered breathing appears to be obstructive in nature with an obstructive apnea index of 11.6.  Today, 10/09/2015: He reports doing okay, has increased his pressure setting on his own, but did not note an improvement in the AHI. He has had more stress, was diagnosed on 08/18/15 with recurrence of Afib during a routine EKG. Has echo scheduled for next week. He has lost a few pounds.  His wife was recently diagnosed with ovarian cancer. She is going through chemotherapy. She had a hysterectomy. He has some stress regarding her treatment and so forth.  Previously:  I saw him on 10/07/2014, at which time his AHI was suboptimal at 8 per hour on a pressure of 9 cm and I suggested we increase his pressure to 10 cm.  I reviewed his CPAP compliance data from 03/08/2015 through 04/06/2015 which is a total of 30 days during which time he used his  machine every night with percent used days greater than 4 hours at 97%, indicating excellent compliance with an average usage of 7 hours and 33 minutes and residual AHI is suboptimal at 12 per hour with leak low and the 95th percentile of leak at 4.9 L/m on a pressure of 10 cm.  I first met him on 06/25/2014 at the request of his primary care physician, at which time the patient reported a diagnosis of obstructive sleep apnea at least 10 years prior and he was using a CPAP machine. He was also diagnosed with peripheral neuropathy over 10 years ago and had workup when he was still residing in [place], [place]. He was on auto BiPAP. He reported paresthesias and numbness in his feet up to the ankles for the past 10+ years. He reported no pain. He complained of balance problems. He had not had an EMG or nerve conduction test. He reported a family history of neuropathy in his mother and a cousin. At the time of his first visit I suggested reevaluation of his obstructive sleep apnea and invited him back for sleep study. He also was advised to have an EMG nerve conduction test. He had this on and 07/22/2014, and it showed mild length-dependent axonal peripheral neuropathy. We also did blood work. This showed a normal TSH, normal vitamin D, normal ANA, normal CRP, normal RPR, his hemoglobin A1c was elevated at 6.0 which was borderline and his rheumatoid factor was elevated at 49. When we called him with his blood test results he  indicated that he had seen a rheumatologist in [place] because of similar test results by his primary care physician at the time. He had a split-night sleep study on 07/21/2014 and went over his test results with him in detail today. Baseline sleep efficiency was reduced at 59.9% with a prolonged latency to sleep of 25 minutes and wake after sleep onset of 30 minutes with moderate to severe sleep fragmentation noted. He had an elevated arousal index. He had an elevated percentage of  light stage sleep and absence of slow-wave and REM sleep. He had occasional PVCs. He had moderate snoring. Total AHI was 56.3 per hour. Baseline oxygen saturation was 91%, nadir was 81%. He was started on CPAP of 5 cm and titrated up to 10 cm. His AHI was reduced to 0 per hour on a pressure of 9 cm. He achieved a sleep efficiency of 55%. Arousal index was improved. He had an increased percentage of stage I sleep, absence of slow-wave sleep and an increased percentage of REM sleep at 40.7%. Average oxygen saturation on CPAP was 92%, nadir was 86%. Snoring was eliminated. He was noted to have severe periodic leg movements of sleep after CPAP was started and moderate periodic leg movements before CPAP was started. He associated PLM arousal index was also mildly elevated.    I reviewed the patient's CPAP compliance data from 08/12/2014 to 09/10/2014, which is a total of 30 days, during which time the patient used CPAP every day. The average usage for all days was 7 hours and 6 minutes. The percent used days greater than 4 hours was 100 %, indicating superb compliance. The residual AHI was elevated at 9.2 per hour, indicating a suboptimal treatment pressure of 9 cwp. Air leak from the mask was very low with the 95th percentile at 0.2 L per minute.  I reviewed his compliance data from 09/07/2014 through 10/06/2014 which is a total of 30 days during which time he used his CPAP every day. Percent used days greater than 4 hours was 100%, indicating superb compliance, leak was low, average usage of 7 hours and 21 minutes, residual AHI at 8 per hour on a pressure of 9 cm.  He relocated from [place], [place]. He was also diagnosed with peripheral neuropathy over 10 years ago and had workup when he was still in [place]. He had a CPAP in the beginning, for what he describes as severe OSA. He has been using a F&F nasal mask. He has been on a VPAP auto 25 from [employer], set at 5-9 with PS of 4.   He has had paresthesias and  numbness in his feet up to the ankles for the past 10 years. He has no pain, but reports that his balance is affected.   He has not had an EMG/NCV. He does have a FHx of neuropathy in his mother and a cousin, he has no siblings.   I reviewed the patient's CPAP compliance data from 12/25/13 to 06/24/14, which is a total of 182 days, during which time the patient used CPAP every day except for 2 days. The average usage for all days was 6 hours and 43 minutes. The percent used days greater than 4 hours was 98%, indicating excellent compliance. The residual AHI was 17.4 per hour, indicating an inadequate treatment setting of 5 cm of min EPAP and 25 cm of max IPAP with PS of 4. Air leak from the mask was acceptable.   His Past Medical History Is Significant For: Past  Medical History  Diagnosis Date  . HTN (hypertension)   . Hyperlipidemia   . Atrial fibrillation ([place])     2008  . Gout   . Nephrolithiasis   . Sleep apnea   . Peripheral neuropathy ([place])   . Cancer [facility])     skin    His Past Surgical History Is Significant For: Past Surgical History  Procedure Laterality Date  . Skin cancer excision  2008, 07/2014  . Hernia repair  1974  . Tonsilectomy/adenoidectomy with myringotomy  1950  . Arm skin lesion biopsy / excision Right 8 15  . Wisdom tooth extraction      His Family History Is Significant For: Family History  Problem Relation Age of Onset  . Heart attack Father   . Heart attack Mother   . Colon cancer Neg Hx   . Esophageal cancer Neg Hx   . Rectal cancer Neg Hx   . Stomach cancer Neg Hx     His Social History Is Significant For: Social History   Social History  . Marital Status: Married    Spouse Name: N/A  . Number of Children: 2  . Years of Education: N/A   Occupational History  . Retired-Linguistics Professor [employer]    Social History Main Topics  . Smoking status: Former Smoker    Quit date: 12/26/1973  . Smokeless tobacco: Never Used  . Alcohol Use: 8.4  oz/week    14 Standard drinks or equivalent per week     Comment: stopped for about 6 weeks, researched and resumed   . Drug Use: No  . Sexual Activity: Not Asked   Other Topics Concern  . None   Social History Narrative    His Allergies Are:  Allergies  Allergen Reactions  . Peanuts [Peanut Oil] Anaphylaxis  . Eggs Or Egg-Derived Products Hives, Itching and Swelling  :   His Current Medications Are:  Outpatient Encounter Prescriptions as of 10/09/2015  Medication Sig  . allopurinol (ZYLOPRIM) 300 MG tablet Take 300 mg by mouth daily.  Marland Kitchen atenolol (TENORMIN) 25 MG tablet Take 25 mg by mouth daily.  Marland Kitchen atorvastatin (LIPITOR) 10 MG tablet Take 10 mg by mouth daily.  Marland Kitchen co-enzyme Q-10 50 MG capsule Take 50 mg by mouth daily.  . colchicine 0.6 MG tablet Take 0.6 mg by mouth as needed.  . diltiazem (CARDIZEM CD) 180 MG 24 hr capsule Take 180 mg by mouth daily.  . hydrocortisone-pramoxine (ANALPRAM-HC) 2.5-1 % rectal cream Place 1 application rectally 3 (three) times daily.   Marland Kitchen lisinopril (PRINIVIL,ZESTRIL) 20 MG tablet Take 20 mg by mouth daily.  . Misc Natural Products (OSTEO BI-FLEX JOINT SHIELD PO) Take 2 tablets by mouth daily.  . Multiple Vitamin (MULTIVITAMIN) tablet Take 1 tablet by mouth daily.  . Omega-3 Fatty Acids (FISH OIL) 1000 MG CAPS Take 1 capsule by mouth daily.  . rivaroxaban (XARELTO) 20 MG TABS tablet Take 1 tablet (20 mg total) by mouth daily with supper.  . valsartan-hydrochlorothiazide (DIOVAN-HCT) 320-12.5 MG per tablet Take 1 tablet by mouth daily.  . vitamin C (ASCORBIC ACID) 500 MG tablet Take 500 mg by mouth daily.  . vitamin E 1000 UNIT capsule Take 1,000 Units by mouth daily.   No facility-administered encounter medications on file as of 10/09/2015.  :  Review of Systems:  Out of a complete 14 point review of systems, all are reviewed and negative with the exception of these symptoms as listed below:   Review of Systems  Cardiovascular:        Recently diagnosed with A-fib. Has an echocardiogram scheduled for up coming Wednesday.   Neurological:       Patient would like to discuss his CPAP machine. He reports that his events per hour is high, he has tried changing the pressure on it to decrease them without success.     Objective:  Neurologic Exam  Physical Exam Physical Examination:   Filed Vitals:   10/09/15 1054  BP: 152/98  Pulse: 78  Resp: 18    General Examination: The patient is a very pleasant 74 y.o. male in no acute distress. He appears well-developed and well-nourished and well groomed. He is obese and overall in good spirits considering his recent stressors.   HEENT: Normocephalic, atraumatic, pupils are equal, round and reactive to light and accommodation. Extraocular tracking is good without limitation to gaze excursion or nystagmus noted. Normal smooth pursuit is noted. Hearing is grossly intact. Face is symmetric with normal facial animation and normal facial sensation. Speech is clear with no dysarthria noted. There is no hypophonia. There is no lip, neck/head, jaw or voice tremor. Neck is supple with full range of passive and active motion. There are no carotid bruits on auscultation. Oropharynx exam reveals:  No significant mouth dryness, adequate dental hygiene and moderate airway crowding, due to larger tone and narrow airway entry. Mallampati is class III. Tongue protrudes centrally and palate elevates symmetrically. Tonsils are absent. He has a Mild overbite. Nasal inspection reveals no significant nasal mucosal bogginess or redness and no septal deviation. He has some scars on his nasal bridge.   Chest: Clear to auscultation without wheezing, rhonchi or crackles noted.  Heart: S1+S2+0, regular and normal without murmurs, rubs or gallops noted.   Abdomen: Soft, non-tender and non-distended with normal bowel sounds appreciated on auscultation.  Extremities: There is trace pitting edema in the distal lower  extremities bilaterally, around the ankles. Pedal pulses are intact.  Skin: Warm and dry without trophic changes noted. There are no varicose veins. He has sun-exposure related changes on his scalp and actinic keratosis.   Musculoskeletal: exam reveals no obvious joint deformities, tenderness or joint swelling or erythema.   Neurologically:  Mental status: The patient is awake, alert and oriented in all 4 spheres. His immediate and remote memory, attention, language skills and fund of knowledge are appropriate. There is no evidence of aphasia, agnosia, apraxia or anomia. Speech is clear with normal prosody and enunciation. Thought process is linear. Mood is normal and affect is normal.  Cranial nerves II - XII are as described above under HEENT exam. In addition: shoulder shrug is normal with equal shoulder height noted. Motor exam: Normal bulk, strength and tone is noted. There is no drift, tremor or rebound. Romberg is showing significant swaying but no corrective steps. Reflexes are 2+ in the upper extremities, 1+ in the knees and absent in the ankles. Babinski: Toes are flexor bilaterally. Fine motor skills and coordination: intact with normal finger taps, normal hand movements, normal rapid alternating patting, normal foot taps and normal foot agility.  Cerebellar testing: No dysmetria or intention tremor on finger to nose testing. Heel to shin is unremarkable bilaterally. There is no truncal or gait ataxia.  Sensory exam: intact to light touch, pinprick, vibration, temperature sense in the upper extremities and unchanged with decreased sensation in the distal lower extremities. Gait, station and balance: He stands easily. No veering to one side is noted. No leaning to one side is  noted. Posture is age-appropriate and stance is slightly wide-based, unchanged. Gait shows normal stride length and normal pace with a slightly wide-based gait.  Assessment and Plan:  In summary, Tyler Sparks is a  very pleasant 74 year old male with an underlying medical history of A. fib, hypertension, hyperlipidemia, prediabetes, skin cancer, gout, kidney stones, and neuropathy (stable), obesity and hypertensive retinopathy, and recent recurrence of A fib, who presents for followup consultation of his severe obstructive sleep apnea now on treatment with CPAP at a pressure of 10 cm. His neuropathy is stable and his exam is stable his residual AHI is too high. He had gained some weight  In the interim but then lost some. He was also recently diagnosed with recurrence of his A. Fib and has been placed on Xarelto. He has an echocardiogram scheduled for next week. He had some interim stress with his wife's health. He is coping okay. He is advised that we should consider bringing him back for a full night titration study to optimize treatment. Timing is not great right now for this as he is dealing with his wife's chemotherapy. Therefore, we mutually agreed to change his settings to AutoPap for now and I will look out for a 30 day download after that. Most of his residual respiratory events are obstructive in nature. He is advised to continue to be compliant with treatment and congratulated on his treatment adherence. I will see him back in about 3 months this time around for closer follow-up and we will re- visit our discussion about bringing him back for a full night sleep study for titration in the near future. I answered all his questions today and the patient was in agreement. He had some additional questions about A. Fib which I tried to answer. He also  Has an appointment with his cardiologist next week.  I spent 25 minutes in total face-to-face time with the patient, more than 50% of which was spent in counseling and coordination of care, reviewing test results, reviewing medication and discussing or reviewing the diagnosis of OSA , A. Fib, and neuropathy, the prognosis and treatment options.

## 2015-10-09 NOTE — Patient Instructions (Signed)
We will change your CPAP to autoPAP for the next 30 days.   I will see you in 3 months for check up and consideration of another sleep study for treatment optimization.

## 2015-10-15 ENCOUNTER — Encounter: Payer: Self-pay | Admitting: Neurology

## 2015-10-15 ENCOUNTER — Other Ambulatory Visit: Payer: Self-pay

## 2015-10-15 ENCOUNTER — Ambulatory Visit (HOSPITAL_COMMUNITY): Payer: Medicare Other | Attending: Cardiology

## 2015-10-15 ENCOUNTER — Ambulatory Visit (INDEPENDENT_AMBULATORY_CARE_PROVIDER_SITE_OTHER): Payer: Medicare Other | Admitting: Cardiovascular Disease

## 2015-10-15 ENCOUNTER — Encounter: Payer: Self-pay | Admitting: Cardiovascular Disease

## 2015-10-15 VITALS — BP 164/94 | HR 69 | Ht 69.0 in | Wt 267.4 lb

## 2015-10-15 DIAGNOSIS — I340 Nonrheumatic mitral (valve) insufficiency: Secondary | ICD-10-CM | POA: Insufficient documentation

## 2015-10-15 DIAGNOSIS — I517 Cardiomegaly: Secondary | ICD-10-CM | POA: Diagnosis not present

## 2015-10-15 DIAGNOSIS — Z87891 Personal history of nicotine dependence: Secondary | ICD-10-CM | POA: Insufficient documentation

## 2015-10-15 DIAGNOSIS — E785 Hyperlipidemia, unspecified: Secondary | ICD-10-CM | POA: Diagnosis not present

## 2015-10-15 DIAGNOSIS — I77810 Thoracic aortic ectasia: Secondary | ICD-10-CM | POA: Diagnosis not present

## 2015-10-15 DIAGNOSIS — I480 Paroxysmal atrial fibrillation: Secondary | ICD-10-CM | POA: Diagnosis not present

## 2015-10-15 DIAGNOSIS — I10 Essential (primary) hypertension: Secondary | ICD-10-CM | POA: Insufficient documentation

## 2015-10-15 NOTE — Progress Notes (Signed)
Chief Complaint  Patient presents with  . Follow-up     History of Present Illness: 74 yo male with history of paroxysmal atrial fibrillation, HTN, HLD, gout, OSA here today for cardiac follow up. I met him in July 2015. He had just moved to [place] to be near family. He had onset of atrial fib with RVR in 2008 and was converted to sinus with IV diltiazem. He has maintained sinus since then on Cardizem and atenolol. He has not been on long term anticoagulation (CHADSVASC 2). He has been followed by cardiology in [place] before moving here. I saw him 08/18/15 and he was in atrial fib. Xarelto was started. Echo today with normal LV function.   He is here today for follow up. He is feeling well. No chest pain or SOB. No palpitations. In atrial fib today.   Primary Care Physician: Velna Hatchet   Past Medical History  Diagnosis Date  . HTN (hypertension)   . Hyperlipidemia   . Atrial fibrillation ([place])     2008  . Gout   . Nephrolithiasis   . Sleep apnea   . Peripheral neuropathy ([place])   . Cancer [facility])     skin    Past Surgical History  Procedure Laterality Date  . Skin cancer excision  2008, 07/2014  . Hernia repair  1974  . Tonsilectomy/adenoidectomy with myringotomy  1950  . Arm skin lesion biopsy / excision Right 8 15  . Wisdom tooth extraction      Current Outpatient Prescriptions  Medication Sig Dispense Refill  . allopurinol (ZYLOPRIM) 300 MG tablet Take 300 mg by mouth daily.    Marland Kitchen atenolol (TENORMIN) 25 MG tablet Take 25 mg by mouth daily.    Marland Kitchen atorvastatin (LIPITOR) 10 MG tablet Take 10 mg by mouth daily.    Marland Kitchen co-enzyme Q-10 50 MG capsule Take 50 mg by mouth daily.    . colchicine 0.6 MG tablet Take 0.6 mg by mouth as needed.    . diltiazem (CARDIZEM CD) 180 MG 24 hr capsule Take 180 mg by mouth daily.    Marland Kitchen lisinopril (PRINIVIL,ZESTRIL) 20 MG tablet Take 20 mg by mouth daily.    . Misc Natural Products (OSTEO BI-FLEX JOINT SHIELD PO) Take 2 tablets by mouth  daily.    . Multiple Vitamin (MULTIVITAMIN) tablet Take 1 tablet by mouth daily.    . Omega-3 Fatty Acids (FISH OIL) 1000 MG CAPS Take 1 capsule by mouth daily.    . rivaroxaban (XARELTO) 20 MG TABS tablet Take 1 tablet (20 mg total) by mouth daily with supper. 30 tablet 6  . valsartan-hydrochlorothiazide (DIOVAN-HCT) 320-12.5 MG per tablet Take 1 tablet by mouth daily.    . vitamin C (ASCORBIC ACID) 500 MG tablet Take 500 mg by mouth daily.    . vitamin E 1000 UNIT capsule Take 1,000 Units by mouth daily.     No current facility-administered medications for this visit.    Allergies  Allergen Reactions  . Peanuts [Peanut Oil] Anaphylaxis  . Eggs Or Egg-Derived Products Hives, Itching and Swelling    Social History   Social History  . Marital Status: Married    Spouse Name: N/A  . Number of Children: 2  . Years of Education: N/A   Occupational History  . Retired-Linguistics Professor [employer]    Social History Main Topics  . Smoking status: Former Smoker    Quit date: 12/26/1973  . Smokeless tobacco: Never Used  . Alcohol Use: 8.4  oz/week    14 Standard drinks or equivalent per week     Comment: stopped for about 6 weeks, researched and resumed   . Drug Use: No  . Sexual Activity: Not on file   Other Topics Concern  . Not on file   Social History Narrative    Family History  Problem Relation Age of Onset  . Heart attack Father   . Heart attack Mother   . Colon cancer Neg Hx   . Esophageal cancer Neg Hx   . Rectal cancer Neg Hx   . Stomach cancer Neg Hx     Review of Systems:  As stated in the HPI and otherwise negative.   BP 164/94 mmHg  Pulse 69  Ht _0  (1.753 m)  Wt 267 lb 6.4 oz (121.292 kg)  BMI 39.47 kg/m2  SpO2 95%  Physical Examination: General: Well developed, well nourished, NAD HEENT: OP clear, mucus membranes moist SKIN: warm, dry. No rashes. Neuro: No focal deficits Musculoskeletal: Muscle strength 5/5 all ext Psychiatric: Mood and affect  normal Neck: No JVD, no carotid bruits, no thyromegaly, no lymphadenopathy. Lungs:Clear bilaterally, no wheezes, rhonci, crackles Cardiovascular: Regular rate and rhythm. No murmurs, gallops or rubs. Abdomen:Soft. Bowel sounds present. Non-tender.  Extremities: No lower extremity edema. Pulses are 2 + in the bilateral DP/PT.  EKG:  EKG is not ordered today. The ekg ordered today demonstrates   Recent Labs: 08/18/2015: BUN 31*; Creatinine, Ser 1.34; Hemoglobin 14.6; Platelets 202.0; Potassium 3.4*; Sodium 143   Lipid Panel No results found for: CHOL, TRIG, HDL, CHOLHDL, VLDL, LDLCALC, LDLDIRECT   Wt Readings from Last 3 Encounters:  10/15/15 267 lb 6.4 oz (121.292 kg)  10/09/15 264 lb (119.75 kg)  08/18/15 271 lb 3.2 oz (123.016 kg)     Other studies Reviewed: Additional studies/ records that were reviewed today include: . Review of the above records demonstrates:   Assessment and Plan:   1. Paroxysmal atrial fibrillation: He is in atrial fib today. He is asymptomatic. His CHADSVASC score is 2. Continue Xarelto 20 mg daily. Continue diltiazem and atenolol. Will use rate control strategy with anti-coagulation since he is asymptomatic. We once again reviewed treatment strategies. I would only move toward cardioversion/ablation strategies if he became symptomatic.   Current medicines are reviewed at length with the patient today.  The patient does not have concerns regarding medicines.  The following changes have been made:   Labs/ tests ordered today include No orders of the defined types were placed in this encounter.    Disposition:   FU with me in 6 months   Signed, Lauree Chandler, MD 10/15/2015 9:58 AM    [facility] [address], [place], [place]  [address] Phone: [phone]; Fax: [phone]

## 2015-10-15 NOTE — Patient Instructions (Signed)

## 2015-10-16 ENCOUNTER — Telehealth: Payer: Self-pay | Admitting: Neurology

## 2015-10-16 NOTE — Telephone Encounter (Signed)
Patient called regarding settings on CPAP, Dr. Rexene Alberts wanted it changed to AUTO by [facility]. [facility] has advised they haven't gotten any messages or calls from [place] about this. Patient is very frustrated as this is his 4th attempt to get this taken care of. Patient states he knows how to do this himself but Dr. Rexene Alberts didn't want him to do that. Dr. Rexene Alberts wanted this done by [facility].

## 2015-10-16 NOTE — Telephone Encounter (Signed)
I have communicated several times with patient via MyChart advising him that I have contacted Betsy with [facility] to get settings changed on machine. (2 messages were sent to her this morning)

## 2015-10-20 NOTE — Telephone Encounter (Signed)
We have communicated further via email. The patient ended up changing the pressure himself since [facility] has not contacted him yet. Confirmed that pressure setting are what Dr. Rexene Alberts would like. Explained the dangers of setting the machine to the wrong pressure settings.

## 2015-10-24 ENCOUNTER — Telehealth: Payer: Self-pay | Admitting: Neurology

## 2015-10-24 NOTE — Telephone Encounter (Signed)
Please call DME and the patient regarding changes that needed to be done on CPAP setting, see also his recent email. thx

## 2015-10-27 NOTE — Telephone Encounter (Signed)
DME and patient are aware.

## 2015-12-21 DIAGNOSIS — I714 Abdominal aortic aneurysm, without rupture, unspecified: Secondary | ICD-10-CM

## 2015-12-21 HISTORY — DX: Abdominal aortic aneurysm, without rupture: I71.4

## 2015-12-21 HISTORY — DX: Abdominal aortic aneurysm, without rupture, unspecified: I71.40

## 2016-01-13 ENCOUNTER — Telehealth: Payer: Self-pay

## 2016-01-13 NOTE — Telephone Encounter (Signed)
I called patient to reschedule appt. Dr. Rexene Alberts will be out sick. Patient was unable to change appt at this point but will call back today to reschedule. He states that he would prefer to see Dr. Rexene Alberts.

## 2016-01-14 ENCOUNTER — Ambulatory Visit: Payer: Medicare Other | Admitting: Neurology

## 2016-01-29 ENCOUNTER — Ambulatory Visit: Payer: Medicare Other | Admitting: Neurology

## 2016-02-04 ENCOUNTER — Encounter: Payer: Self-pay | Admitting: Neurology

## 2016-02-04 ENCOUNTER — Ambulatory Visit (INDEPENDENT_AMBULATORY_CARE_PROVIDER_SITE_OTHER): Payer: Medicare Other | Admitting: Neurology

## 2016-02-04 VITALS — BP 152/86 | HR 68 | Resp 18 | Ht 69.0 in | Wt 260.0 lb

## 2016-02-04 DIAGNOSIS — E669 Obesity, unspecified: Secondary | ICD-10-CM | POA: Diagnosis not present

## 2016-02-04 DIAGNOSIS — G609 Hereditary and idiopathic neuropathy, unspecified: Secondary | ICD-10-CM

## 2016-02-04 DIAGNOSIS — G4733 Obstructive sleep apnea (adult) (pediatric): Secondary | ICD-10-CM | POA: Diagnosis not present

## 2016-02-04 DIAGNOSIS — Z9989 Dependence on other enabling machines and devices: Secondary | ICD-10-CM

## 2016-02-04 DIAGNOSIS — G4731 Primary central sleep apnea: Secondary | ICD-10-CM | POA: Diagnosis not present

## 2016-02-04 NOTE — Patient Instructions (Signed)
We will bring you back for a titration sleep study to optimize treatment.

## 2016-02-04 NOTE — Progress Notes (Signed)
Subjective:    Patient ID: Tyler Sparks is a 75 y.o. male.  HPI     Interim history:   Tyler Sparks is a 75 year old right-handed gentleman with an underlying medical history of A. fib, hypertension, hyperlipidemia, prediabetes, skin cancer, gout, kidney stones, neuropathy, obesity and hypertensive retinopathy, who presents for followup consultation of his obstructive sleep apnea and neuropathy. He is unaccompanied today. I last saw him on 10/09/2015, at which time he was compliant with CPAP therapy but had a residual AHI of 22 per hour, leak acceptable, pressure of 10 cm. I suggested we change his setting to autoPAP and monitor his compliance data. We also talked about potentially bringing him back for a CPAP titration study to optimize treatment.  Today, 02/04/2016: I reviewed his AutoPap compliance data from 01/04/2016 through 02/02/2016 which is a total of 30 days during which time he used his machine every day with percent used days greater than 4 hours at 100%, indicating superb compliance with an average usage of 6 hours and 57 minutes, residual AHI at 18 per hour, 95th percentile pressure at 15.7, leak at times high with the 95th percentile at 26.3 L/m, range of 7 cm to 16 cm. Residual AHI breakdown indicates mostly central apneas at 7 per hour. His obstructive apnea index is 4.6 per hour.   Today, 02/04/2016: He reports not waking up rested, the change to AutoPap has not helped very much. His blood pressure in the mornings is good but pulse rate can be in the 40s and rarely in the 30s. He has an appointment with his cardiologist in May. In the past he saw someone else and cardiology and was advised to undergo a pacemaker placement which he did not want to pursue at the time. He has had interim stressors what with his wife's cancer treatments but she is doing better thankfully. She has completed chemotherapy and he is now willing to schedule a sleep study for further titration and  optimalization of his treatment with which we talked about in the past but he had to delay.   Previously:  I saw him on 04/08/2015, at which time he reported more seasonal allergy symptoms and cough as well as postnasal drip. He had issues with left-sided plantar fasciitis. He had stable symptoms of neuropathy.    I reviewed his CPAP compliance data from 09/07/2015 through 10/06/2015 which is a total of 30 days during which time he used his machine every night with percent used days greater than 4 hours at 100%, indicating superb compliance with an average usage of 6 hours and 45 minutes, residual AHI high at 22.3 per hour, leak acceptable for the 95th percentile at 8.4 L/m on a pressure of 10 cm. Most of his residual sleep-disordered breathing appears to be obstructive in nature with an obstructive apnea index of 11.6.  I saw him on 10/07/2014, at which time his AHI was suboptimal at 8 per hour on a pressure of 9 cm and I suggested we increase his pressure to 10 cm.  I reviewed his CPAP compliance data from 03/08/2015 through 04/06/2015 which is a total of 30 days during which time he used his machine every night with percent used days greater than 4 hours at 97%, indicating excellent compliance with an average usage of 7 hours and 33 minutes and residual AHI is suboptimal at 12 per hour with leak low and the 95th percentile of leak at 4.9 L/m on a pressure of 10 cm.  I first met  him on 06/25/2014 at the request of his primary care physician, at which time the patient reported a diagnosis of obstructive sleep apnea at least 10 years prior and he was using a CPAP machine. He was also diagnosed with peripheral neuropathy over 10 years ago and had workup when he was still residing in [place], [place]. He was on auto BiPAP. He reported paresthesias and numbness in his feet up to the ankles for the past 10+ years. He reported no pain. He complained of balance problems. He had not had an EMG or  nerve conduction test. He reported a family history of neuropathy in his mother and a cousin. At the time of his first visit I suggested reevaluation of his obstructive sleep apnea and invited him back for sleep study. He also was advised to have an EMG nerve conduction test. He had this on and 07/22/2014, and it showed mild length-dependent axonal peripheral neuropathy. We also did blood work. This showed a normal TSH, normal vitamin D, normal ANA, normal CRP, normal RPR, his hemoglobin A1c was elevated at 6.0 which was borderline and his rheumatoid factor was elevated at 49. When we called him with his blood test results he indicated that he had seen a rheumatologist in [place] because of similar test results by his primary care physician at the time. He had a split-night sleep study on 07/21/2014 and went over his test results with him in detail today. Baseline sleep efficiency was reduced at 59.9% with a prolonged latency to sleep of 25 minutes and wake after sleep onset of 30 minutes with moderate to severe sleep fragmentation noted. He had an elevated arousal index. He had an elevated percentage of light stage sleep and absence of slow-wave and REM sleep. He had occasional PVCs. He had moderate snoring. Total AHI was 56.3 per hour. Baseline oxygen saturation was 91%, nadir was 81%. He was started on CPAP of 5 cm and titrated up to 10 cm. His AHI was reduced to 0 per hour on a pressure of 9 cm. He achieved a sleep efficiency of 55%. Arousal index was improved. He had an increased percentage of stage I sleep, absence of slow-wave sleep and an increased percentage of REM sleep at 40.7%. Average oxygen saturation on CPAP was 92%, nadir was 86%. Snoring was eliminated. He was noted to have severe periodic leg movements of sleep after CPAP was started and moderate periodic leg movements before CPAP was started. He associated PLM arousal index was also mildly elevated.    I reviewed the patient's CPAP compliance  data from 08/12/2014 to 09/10/2014, which is a total of 30 days, during which time the patient used CPAP every day. The average usage for all days was 7 hours and 6 minutes. The percent used days greater than 4 hours was 100 %, indicating superb compliance. The residual AHI was elevated at 9.2 per hour, indicating a suboptimal treatment pressure of 9 cwp. Air leak from the mask was very low with the 95th percentile at 0.2 L per minute.  I reviewed his compliance data from 09/07/2014 through 10/06/2014 which is a total of 30 days during which time he used his CPAP every day. Percent used days greater than 4 hours was 100%, indicating superb compliance, leak was low, average usage of 7 hours and 21 minutes, residual AHI at 8 per hour on a pressure of 9 cm.  He relocated from [place], [place]. He was also diagnosed with peripheral neuropathy over 10 years ago and  had workup when he was still in [place]. He had a CPAP in the beginning, for what he describes as severe OSA. He has been using a F&F nasal mask. He has been on a VPAP auto 25 from [place], set at 5-9 with PS of 4.   He has had paresthesias and numbness in his feet up to the ankles for the past 10 years. He has no pain, but reports that his balance is affected.   He has not had an EMG/NCV. He does have a FHx of neuropathy in his mother and a cousin, he has no siblings.   I reviewed the patient's CPAP compliance data from 12/25/13 to 06/24/14, which is a total of 182 days, during which time the patient used CPAP every day except for 2 days. The average usage for all days was 6 hours and 43 minutes. The percent used days greater than 4 hours was 98%, indicating excellent compliance. The residual AHI was 17.4 per hour, indicating an inadequate treatment setting of 5 cm of min EPAP and 25 cm of max IPAP with PS of 4. Air leak from the mask was acceptable.    His Past Medical History Is Significant For: Past Medical History  Diagnosis Date  . HTN  (hypertension)   . Hyperlipidemia   . Atrial fibrillation ([place])     2008  . Gout   . Nephrolithiasis   . Sleep apnea   . Peripheral neuropathy ([place])   . Cancer [facility])     skin    His Past Surgical History Is Significant For: Past Surgical History  Procedure Laterality Date  . Skin cancer excision  2008, 07/2014  . Hernia repair  1974  . Tonsilectomy/adenoidectomy with myringotomy  1950  . Arm skin lesion biopsy / excision Right 8 15  . Wisdom tooth extraction      His Family History Is Significant For: Family History  Problem Relation Age of Onset  . Heart attack Father   . Heart attack Mother   . Colon cancer Neg Hx   . Esophageal cancer Neg Hx   . Rectal cancer Neg Hx   . Stomach cancer Neg Hx     His Social History Is Significant For: Social History   Social History  . Marital Status: Married    Spouse Name: N/A  . Number of Children: 2  . Years of Education: N/A   Occupational History  . Retired-Linguistics Professor [employer]    Social History Main Topics  . Smoking status: Former Smoker    Quit date: 12/26/1973  . Smokeless tobacco: Never Used  . Alcohol Use: 8.4 oz/week    14 Standard drinks or equivalent per week     Comment: stopped for about 6 weeks, researched and resumed   . Drug Use: No  . Sexual Activity: Not Asked   Other Topics Concern  . None   Social History Narrative    His Allergies Are:  Allergies  Allergen Reactions  . Peanuts [Peanut Oil] Anaphylaxis  . Eggs Or Egg-Derived Products Hives, Itching and Swelling  :   His Current Medications Are:  Outpatient Encounter Prescriptions as of 02/04/2016  Medication Sig  . allopurinol (ZYLOPRIM) 300 MG tablet Take 300 mg by mouth daily.  Marland Kitchen atenolol (TENORMIN) 25 MG tablet Take 25 mg by mouth daily.  Marland Kitchen atorvastatin (LIPITOR) 10 MG tablet Take 10 mg by mouth daily.  Marland Kitchen co-enzyme Q-10 50 MG capsule Take 50 mg by mouth daily.  . colchicine 0.6  MG tablet Take 0.6 mg by mouth as needed.  .  diltiazem (CARDIZEM CD) 180 MG 24 hr capsule Take 180 mg by mouth daily.  Marland Kitchen lisinopril (PRINIVIL,ZESTRIL) 20 MG tablet Take 20 mg by mouth daily.  . Misc Natural Products (OSTEO BI-FLEX JOINT SHIELD PO) Take 2 tablets by mouth daily.  . Multiple Vitamin (MULTIVITAMIN) tablet Take 1 tablet by mouth daily.  . Omega-3 Fatty Acids (FISH OIL) 1000 MG CAPS Take 1 capsule by mouth daily.  . rivaroxaban (XARELTO) 20 MG TABS tablet Take 1 tablet (20 mg total) by mouth daily with supper.  . valsartan-hydrochlorothiazide (DIOVAN-HCT) 320-12.5 MG per tablet Take 1 tablet by mouth daily.  . vitamin C (ASCORBIC ACID) 500 MG tablet Take 500 mg by mouth daily.  . vitamin E 1000 UNIT capsule Take 1,000 Units by mouth daily.   No facility-administered encounter medications on file as of 02/04/2016.  :  Review of Systems:  Out of a complete 14 point review of systems, all are reviewed and negative with the exception of these symptoms as listed below:   Review of Systems  Neurological:       Patient is here for f/u. Reports he is still having trouble with AutoPAP.     Objective:  Neurologic Exam  Physical Exam Physical Examination:   Filed Vitals:   02/04/16 0824  BP: 152/86  Pulse: 68  Resp: 18    General Examination: The patient is a very pleasant 75 y.o. male in no acute distress. He appears well-developed and well-nourished and well groomed. He is obese and overall in good spirits today.  HEENT: Normocephalic, atraumatic, pupils are equal, round and reactive to light and accommodation. Extraocular tracking is good without limitation to gaze excursion or nystagmus noted. Normal smooth pursuit is noted. Hearing is grossly intact. Face is symmetric with normal facial animation and normal facial sensation. Speech is clear with no dysarthria noted. There is no hypophonia. There is no lip, neck/head, jaw or voice tremor. Neck is supple with full range of passive and active motion. There are no carotid  bruits on auscultation. Oropharynx exam reveals:  No significant mouth dryness, adequate dental hygiene and moderate airway crowding, due to larger tone and narrow airway entry. Mallampati is class III. Tongue protrudes centrally and palate elevates symmetrically. Tonsils are absent. He has a Mild overbite. Nasal inspection reveals no significant nasal mucosal bogginess or redness and no septal deviation. He has some scars on his nasal bridge.   Chest: Clear to auscultation without wheezing, rhonchi or crackles noted.  Heart: S1+S2+0, regular and normal without murmurs, rubs or gallops noted.   Abdomen: Soft, non-tender and non-distended with normal bowel sounds appreciated on auscultation.  Extremities: There is trace pitting edema in the distal lower extremities bilaterally, around the ankles, stable. Pedal pulses are intact.  Skin: Warm and dry without trophic changes noted. There are no varicose veins. He has sun-exposure related changes on his scalp and actinic keratosis.   Musculoskeletal: exam reveals no obvious joint deformities, tenderness or joint swelling or erythema.   Neurologically:  Mental status: The patient is awake, alert and oriented in all 4 spheres. His immediate and remote memory, attention, language skills and fund of knowledge are appropriate. There is no evidence of aphasia, agnosia, apraxia or anomia. Speech is clear with normal prosody and enunciation. Thought process is linear. Mood is normal and affect is normal.  Cranial nerves II - XII are as described above under HEENT exam. In addition: shoulder  shrug is normal with equal shoulder height noted. Motor exam: Normal bulk, strength and tone is noted. There is no drift, tremor or rebound. Romberg is shows minimal sway. Reflexes are 2+ in the upper extremities, 1+ in the knees and absent in the ankles. Fine motor skills and coordination: intact with normal finger taps, normal hand movements, normal rapid alternating  patting, normal foot taps and normal foot agility.  Cerebellar testing: No dysmetria or intention tremor on finger to nose testing. Heel to shin is unremarkable bilaterally. There is no truncal or gait ataxia.  Sensory exam: intact to light touch, pinprick, vibration, temperature sense in the upper extremities and unchanged with decreased sensation in the distal lower extremities. Gait, station and balance: He stands easily. No veering to one side is noted. No leaning to one side is noted. Posture is age-appropriate and stance is slightly wide-based, unchanged. Gait shows normal stride length and normal pace with a slightly wide-based gait. Tandem walk is difficult for him.   Assessment and Plan:  In summary, Tyler Sparks is a very pleasant 75 year old male with an underlying medical history of A. fib, hypertension, hyperlipidemia, prediabetes, skin cancer, gout, kidney stones, and neuropathy (stable), obesity and hypertensive retinopathy, and recent recurrence of A fib, who presents for followup consultation of his severe obstructive sleep apnea, currently on autoPAP, previously on CPAP of 10 cm. His neuropathy is stable and his exam is stable in that regard.  His autoPAP trial shows that his residual AHI is too high. He had gained some weight in the interim but then lost some. He was also diagnosed with recurrence of his A. Fib and was placed on Xarelto.  his residual AHI indicates primarily central apneas. At this juncture, I recommended that we bring him back for a full night titration study, starting with CPAP and switching to BiPAP if needed to improve his central and obstructive sleep disordered breathing. He has an appointment with his cardiologist in May. I will see him back after his next sleep study. I answered all his questions today and he was in agreement.  I spent 25 minutes in total face-to-face time with the patient, more than 50% of which was spent in counseling and coordination of care,  reviewing test results, reviewing medication and discussing or reviewing the diagnosis of OSA , A. Fib, and neuropathy, the prognosis and treatment options.

## 2016-02-19 ENCOUNTER — Ambulatory Visit (INDEPENDENT_AMBULATORY_CARE_PROVIDER_SITE_OTHER): Payer: Medicare Other | Admitting: Neurology

## 2016-02-19 DIAGNOSIS — R9431 Abnormal electrocardiogram [ECG] [EKG]: Secondary | ICD-10-CM

## 2016-02-19 DIAGNOSIS — G4761 Periodic limb movement disorder: Secondary | ICD-10-CM

## 2016-02-19 DIAGNOSIS — G4733 Obstructive sleep apnea (adult) (pediatric): Secondary | ICD-10-CM

## 2016-02-19 DIAGNOSIS — G4731 Primary central sleep apnea: Secondary | ICD-10-CM

## 2016-02-19 DIAGNOSIS — G479 Sleep disorder, unspecified: Secondary | ICD-10-CM

## 2016-02-20 NOTE — Sleep Study (Signed)
Please see the scanned sleep study interpretation located in the procedure tab within the chart review section.   

## 2016-02-23 ENCOUNTER — Telehealth: Payer: Self-pay | Admitting: Neurology

## 2016-02-23 DIAGNOSIS — G4733 Obstructive sleep apnea (adult) (pediatric): Secondary | ICD-10-CM

## 2016-02-23 DIAGNOSIS — G4731 Primary central sleep apnea: Secondary | ICD-10-CM

## 2016-02-23 DIAGNOSIS — G479 Sleep disorder, unspecified: Secondary | ICD-10-CM

## 2016-02-23 DIAGNOSIS — R9431 Abnormal electrocardiogram [ECG] [EKG]: Secondary | ICD-10-CM

## 2016-02-23 DIAGNOSIS — G4761 Periodic limb movement disorder: Secondary | ICD-10-CM

## 2016-02-23 NOTE — Telephone Encounter (Signed)
I spoke to patient and he is aware of results and recommendations. I have contact Mary from [facility] about the orders. Patient will call back with any further questions. I will fax report to PCP.

## 2016-02-23 NOTE — Telephone Encounter (Signed)
Patient referred by Dr. Ardeth Perfect, last seen by me on 02/04/16, diagnostic CPAP study on 02/19/16, ins: [employer].    Please call and notify the patient that the recent sleep study did confirm the diagnosis of central sleep apnea. He did not do well enough on CPAP or BiPAP and did not sleep well. Had PLMs too, which were disruptive to sleep also. At this point I recommend trial of autoBiPAP (he is currently on autoPAP). His DME representative will change the machine/settings, educate him on how to use the machine, how to put the mask on, etc. I have placed an order in the chart. Please send referral, talk to patient, send report to PCP and referring MD. We will need a FU in sleep clinic for 8 to 10 weeks post-PAP set up, please arrange that as well. Thanks,   Star Age, MD, PhD [facility])

## 2016-02-24 ENCOUNTER — Telehealth: Payer: Self-pay | Admitting: Neurology

## 2016-02-24 DIAGNOSIS — G4733 Obstructive sleep apnea (adult) (pediatric): Secondary | ICD-10-CM

## 2016-02-24 DIAGNOSIS — G4731 Primary central sleep apnea: Secondary | ICD-10-CM

## 2016-02-24 NOTE — Telephone Encounter (Signed)
Resubmitted

## 2016-02-24 NOTE — Telephone Encounter (Signed)
Resubmit autoBiPAP order please.

## 2016-03-10 ENCOUNTER — Other Ambulatory Visit: Payer: Self-pay | Admitting: Cardiovascular Disease

## 2016-04-26 ENCOUNTER — Ambulatory Visit (INDEPENDENT_AMBULATORY_CARE_PROVIDER_SITE_OTHER): Payer: Medicare Other | Admitting: Neurology

## 2016-04-26 ENCOUNTER — Encounter: Payer: Self-pay | Admitting: Neurology

## 2016-04-26 VITALS — BP 155/94 | HR 54 | Resp 18 | Ht 69.0 in | Wt 256.0 lb

## 2016-04-26 DIAGNOSIS — G4731 Primary central sleep apnea: Secondary | ICD-10-CM | POA: Diagnosis not present

## 2016-04-26 DIAGNOSIS — G4733 Obstructive sleep apnea (adult) (pediatric): Secondary | ICD-10-CM

## 2016-04-26 DIAGNOSIS — E669 Obesity, unspecified: Secondary | ICD-10-CM | POA: Diagnosis not present

## 2016-04-26 NOTE — Patient Instructions (Addendum)
You can try Melatonin at night for sleep: take 1 mg to 3 mg, one to 2 hours before your bedtime. You can go up to 5 mg if needed. It is over the counter and comes in pill form, chewable form and spray, if you prefer. I don't recommend more than 10 mg to anyone.   We will change your pressure setting some to increase the pressure, allowing for your max to be 20/16 cm.

## 2016-04-26 NOTE — Progress Notes (Signed)
Subjective:    Patient ID: Tyler Sparks is a 75 y.o. male.  HPI     Interim history:   Tyler Sparks is a 75 year old right-handed gentleman with an underlying medical history of A. fib, hypertension, hyperlipidemia, prediabetes, skin cancer, gout, kidney stones, neuropathy, obesity and hypertensive retinopathy, who presents for followup consultation of his obstructive sleep apnea and neuropathy, after his recent sleep study. He is unaccompanied today. I last saw him on 02/04/2016, at which time he reported not waking up rested. AutoPap was not very helpful. He was supposed to see his cardiologist. His pulse rate would go low into the 40s and 50s in the mornings. His blood pressure would be okay in the mornings. Cardiology has previously asked him to consider pacemaker but he did not wish to pursue it at the time. He had ongoing stressors what with his wife's cancer treatments but thankfully she was stable. She had completed chemotherapy. The patient agreed to come back for a full night titration study to help optimize his treatment settings. He had a split-night sleep study on 07/21/2014. He had a CPAP titration study on 02/19/2016. Sleep efficiency was 49%, sleep latency 12 minutes, wake after sleep onset was 173.5 minutes with moderate sleep fragmentation noted. His arousal index was elevated, he had an increased percentage of light stage sleep, absence of slow-wave sleep, and a decreased percentage of REM sleep at 14.6% with a prolonged REM latency. He had severe PLMS with mild arousals. He had a total of 8 central apneas, 7 obstructive and 42 central hypopneas for the night. Average oxygen saturation was 93%, nadir was 80%. He was started on CPAP with a pressure of 6 cm and titrated to 7 cm but he had ongoing mostly central respiratory events. He was switched to standard BiPAP, starting with 9/5 cm and titrated to 10/6 cm and 11/7 cm, AHI was high. BiPAP ST was tried with a backup rate of 14, then  down to 12/m. He was also tried on a his feet but then could not sleep on ASV. No modality was clearly optimal for him, with the exception that CPAP was not successful. I suggested a trial of Auto BiPAP for now. I also suggested treatment of his PLMD which may help him achieve more consolidated sleep.   Today, 04/26/2016: I reviewed his auto BiPAP compliance data from 03/23/2016 through 04/21/2016 which is a total of 30 days during which time he used his machine every night with percent used days greater than 4 hours at 100%, indicating superb compliance with an average usage of 6 hours and 25 minutes, minimum EPAP of 4, maximum IPAP of 16, pressure support of 4, leak acceptable with the 95th percentile at 16.4 L/m average AHI suboptimal at 17.8 per hour.  Today, 04/26/2016: He reports that during his most recent sleep study in March 2017 he was very uncomfortable, not able to sleep on his back, he had tendinitis and was having leg pain. He did not think his sleep study was reflective of his typical sleep. Nevertheless, he does have sleep maintenance issues. He has not been on a sleep aid. He would like to avoid additional medication if possible. Going to sleep is not a problem for him. Sometimes his AHI appears to be down in the first part of the night and then increases into the 2 digits later on at night as far as he can tell from the machine. He has no new complaints. He is trying to lose weight and  has overall lost about 25 pounds from his maximum weight. Thankfully, his wife's cancer treatment has been complete and she is doing well.   Previously:  I saw him on 10/09/2015, at which time he was compliant with CPAP therapy but had a residual AHI of 22 per hour, leak acceptable, pressure of 10 cm. I suggested we change his setting to autoPAP and monitor his compliance data. We also talked about potentially bringing him back for a CPAP titration study to optimize treatment.  I reviewed his AutoPap  compliance data from 01/04/2016 through 02/02/2016 which is a total of 30 days during which time he used his machine every day with percent used days greater than 4 hours at 100%, indicating superb compliance with an average usage of 6 hours and 57 minutes, residual AHI at 18 per hour, 95th percentile pressure at 15.7, leak at times high with the 95th percentile at 26.3 L/m, range of 7 cm to 16 cm. Residual AHI breakdown indicates mostly central apneas at 7 per hour. His obstructive apnea index is 4.6 per hour.   I saw him on 04/08/2015, at which time he reported more seasonal allergy symptoms and cough as well as postnasal drip. He had issues with left-sided plantar fasciitis. He had stable symptoms of neuropathy.    I reviewed his CPAP compliance data from 09/07/2015 through 10/06/2015 which is a total of 30 days during which time he used his machine every night with percent used days greater than 4 hours at 100%, indicating superb compliance with an average usage of 6 hours and 45 minutes, residual AHI high at 22.3 per hour, leak acceptable for the 95th percentile at 8.4 L/m on a pressure of 10 cm. Most of his residual sleep-disordered breathing appears to be obstructive in nature with an obstructive apnea index of 11.6.  I saw him on 10/07/2014, at which time his AHI was suboptimal at 8 per hour on a pressure of 9 cm and I suggested we increase his pressure to 10 cm.  I reviewed his CPAP compliance data from 03/08/2015 through 04/06/2015 which is a total of 30 days during which time he used his machine every night with percent used days greater than 4 hours at 97%, indicating excellent compliance with an average usage of 7 hours and 33 minutes and residual AHI is suboptimal at 12 per hour with leak low and the 95th percentile of leak at 4.9 L/m on a pressure of 10 cm.  I first met him on 06/25/2014 at the request of his primary care physician, at which time the patient reported a diagnosis of  obstructive sleep apnea at least 10 years prior and he was using a CPAP machine. He was also diagnosed with peripheral neuropathy over 10 years ago and had workup when he was still residing in [place], [place]. He was on auto BiPAP. He reported paresthesias and numbness in his feet up to the ankles for the past 10+ years. He reported no pain. He complained of balance problems. He had not had an EMG or nerve conduction test. He reported a family history of neuropathy in his mother and a cousin. At the time of his first visit I suggested reevaluation of his obstructive sleep apnea and invited him back for sleep study. He also was advised to have an EMG nerve conduction test. He had this on and 07/22/2014, and it showed mild length-dependent axonal peripheral neuropathy. We also did blood work. This showed a normal TSH, normal vitamin D,  normal ANA, normal CRP, normal RPR, his hemoglobin A1c was elevated at 6.0 which was borderline and his rheumatoid factor was elevated at 49. When we called him with his blood test results he indicated that he had seen a rheumatologist in [place] because of similar test results by his primary care physician at the time. He had a split-night sleep study on 07/21/2014 and went over his test results with him in detail today. Baseline sleep efficiency was reduced at 59.9% with a prolonged latency to sleep of 25 minutes and wake after sleep onset of 30 minutes with moderate to severe sleep fragmentation noted. He had an elevated arousal index. He had an elevated percentage of light stage sleep and absence of slow-wave and REM sleep. He had occasional PVCs. He had moderate snoring. Total AHI was 56.3 per hour. Baseline oxygen saturation was 91%, nadir was 81%. He was started on CPAP of 5 cm and titrated up to 10 cm. His AHI was reduced to 0 per hour on a pressure of 9 cm. He achieved a sleep efficiency of 55%. Arousal index was improved. He had an increased percentage of stage I  sleep, absence of slow-wave sleep and an increased percentage of REM sleep at 40.7%. Average oxygen saturation on CPAP was 92%, nadir was 86%. Snoring was eliminated. He was noted to have severe periodic leg movements of sleep after CPAP was started and moderate periodic leg movements before CPAP was started. He associated PLM arousal index was also mildly elevated.    I reviewed the patient's CPAP compliance data from 08/12/2014 to 09/10/2014, which is a total of 30 days, during which time the patient used CPAP every day. The average usage for all days was 7 hours and 6 minutes. The percent used days greater than 4 hours was 100 %, indicating superb compliance. The residual AHI was elevated at 9.2 per hour, indicating a suboptimal treatment pressure of 9 cwp. Air leak from the mask was very low with the 95th percentile at 0.2 L per minute.  I reviewed his compliance data from 09/07/2014 through 10/06/2014 which is a total of 30 days during which time he used his CPAP every day. Percent used days greater than 4 hours was 100%, indicating superb compliance, leak was low, average usage of 7 hours and 21 minutes, residual AHI at 8 per hour on a pressure of 9 cm.  He relocated from [place], [place]. He was also diagnosed with peripheral neuropathy over 10 years ago and had workup when he was still in [place]. He had a CPAP in the beginning, for what he describes as severe OSA. He has been using a F&F nasal mask. He has been on a VPAP auto 25 from [place], set at 5-9 with PS of 4.   He has had paresthesias and numbness in his feet up to the ankles for the past 10 years. He has no pain, but reports that his balance is affected.   He has not had an EMG/NCV. He does have a FHx of neuropathy in his mother and a cousin, he has no siblings.   I reviewed the patient's CPAP compliance data from 12/25/13 to 06/24/14, which is a total of 182 days, during which time the patient used CPAP every day except for 2 days. The  average usage for all days was 6 hours and 43 minutes. The percent used days greater than 4 hours was 98%, indicating excellent compliance. The residual AHI was 17.4 per hour, indicating an inadequate treatment  setting of 5 cm of min EPAP and 25 cm of max IPAP with PS of 4. Air leak from the mask was acceptable.  His Past Medical History Is Significant For: Past Medical History  Diagnosis Date  . HTN (hypertension)   . Hyperlipidemia   . Atrial fibrillation ([place])     2008  . Gout   . Nephrolithiasis   . Sleep apnea   . Peripheral neuropathy ([place])   . Cancer [facility])     skin    His Past Surgical History Is Significant For: Past Surgical History  Procedure Laterality Date  . Skin cancer excision  2008, 07/2014  . Hernia repair  1974  . Tonsilectomy/adenoidectomy with myringotomy  1950  . Arm skin lesion biopsy / excision Right 8 15  . Wisdom tooth extraction      His Family History Is Significant For: Family History  Problem Relation Age of Onset  . Heart attack Father   . Heart attack Mother   . Colon cancer Neg Hx   . Esophageal cancer Neg Hx   . Rectal cancer Neg Hx   . Stomach cancer Neg Hx     His Social History Is Significant For: Social History   Social History  . Marital Status: Married    Spouse Name: N/A  . Number of Children: 2  . Years of Education: N/A   Occupational History  . Retired-Linguistics Professor [employer]    Social History Main Topics  . Smoking status: Former Smoker    Quit date: 12/26/1973  . Smokeless tobacco: Never Used  . Alcohol Use: 8.4 oz/week    14 Standard drinks or equivalent per week     Comment: stopped for about 6 weeks, researched and resumed   . Drug Use: No  . Sexual Activity: Not Asked   Other Topics Concern  . None   Social History Narrative    His Allergies Are:  Allergies  Allergen Reactions  . Peanuts [Peanut Oil] Anaphylaxis  . Eggs Or Egg-Derived Products Hives, Itching and Swelling  :   His Current  Medications Are:  Outpatient Encounter Prescriptions as of 04/26/2016  Medication Sig  . allopurinol (ZYLOPRIM) 300 MG tablet Take 300 mg by mouth daily.  Marland Kitchen atenolol (TENORMIN) 25 MG tablet Take 25 mg by mouth daily.  Marland Kitchen atorvastatin (LIPITOR) 10 MG tablet Take 10 mg by mouth daily.  Marland Kitchen co-enzyme Q-10 50 MG capsule Take 50 mg by mouth daily.  . colchicine 0.6 MG tablet Take 0.6 mg by mouth as needed.  . diltiazem (CARDIZEM CD) 180 MG 24 hr capsule Take 180 mg by mouth daily.  Marland Kitchen lisinopril (PRINIVIL,ZESTRIL) 20 MG tablet Take 20 mg by mouth daily.  . Misc Natural Products (OSTEO BI-FLEX JOINT SHIELD PO) Take 2 tablets by mouth daily.  . Multiple Vitamin (MULTIVITAMIN) tablet Take 1 tablet by mouth daily.  . Omega-3 Fatty Acids (FISH OIL) 1000 MG CAPS Take 1 capsule by mouth daily.  . valsartan-hydrochlorothiazide (DIOVAN-HCT) 320-12.5 MG per tablet Take 1 tablet by mouth daily.  . vitamin C (ASCORBIC ACID) 500 MG tablet Take 500 mg by mouth daily.  . vitamin E 1000 UNIT capsule Take 1,000 Units by mouth daily.  Alveda Reasons 20 MG TABS tablet TAKE ONE TABLET (20 MG) BY MOUTH DAILY WITH SUPPER.   No facility-administered encounter medications on file as of 04/26/2016.  :  Review of Systems:  Out of a complete 14 point review of systems, all are reviewed and negative with  the exception of these symptoms as listed below:   Review of Systems  Neurological:       Patient is here to discuss some issues with his BiPAP machine.     Objective:  Neurologic Exam  Physical Exam Physical Examination:   Filed Vitals:   04/26/16 0910  BP: 155/94  Pulse: 54  Resp: 18    General Examination: The patient is a very pleasant 75 y.o. male in no acute distress. He appears well-developed and well-nourished and well groomed. He is obese and overall in good spirits today.  HEENT: Normocephalic, atraumatic, pupils are equal, round and reactive to light and accommodation. Extraocular tracking is good without  limitation to gaze excursion or nystagmus noted. Normal smooth pursuit is noted. Bilateral cataracts are noted. Hearing is grossly intact. Face is symmetric with normal facial animation and normal facial sensation. Speech is clear with no dysarthria noted. There is no hypophonia. There is no lip, neck/head, jaw or voice tremor. Neck is supple with full range of passive and active motion. There are no carotid bruits on auscultation. Oropharynx exam reveals:  No significant mouth dryness, adequate dental hygiene and moderate airway crowding, due to larger tone and narrow airway entry. Mallampati is class III. Tongue protrudes centrally and palate elevates symmetrically. Tonsils are absent. He has a Mild overbite. Nasal inspection reveals no significant nasal mucosal bogginess or redness and no septal deviation. He has some scars on his nasal bridge.   Chest: Clear to auscultation without wheezing, rhonchi or crackles noted.  Heart: S1+S2+0, regular and normal without murmurs, rubs or gallops noted.   Abdomen: Soft, non-tender and non-distended with normal bowel sounds appreciated on auscultation.  Extremities: There is trace pitting edema in the distal lower extremities bilaterally, around the ankles, left more noticeable. Pedal pulses are intact.  Skin: Warm and dry without trophic changes noted. There are no varicose veins. He has sun-exposure related changes on his scalp and actinic keratosis.   Musculoskeletal: exam reveals no obvious joint deformities, tenderness or joint swelling or erythema.   Neurologically:  Mental status: The patient is awake, alert and oriented in all 4 spheres. His immediate and remote memory, attention, language skills and fund of knowledge are appropriate. There is no evidence of aphasia, agnosia, apraxia or anomia. Speech is clear with normal prosody and enunciation. Thought process is linear. Mood is normal and affect is normal.  Cranial nerves II - XII are as described  above under HEENT exam. In addition: shoulder shrug is normal with equal shoulder height noted. Motor exam: Normal bulk, strength and tone is noted. There is no drift, tremor or rebound. Romberg is shows minimal sway. Reflexes are 2+ in the upper extremities, 1+ in the knees and absent in the ankles. Fine motor skills and coordination: intact with normal finger taps, normal hand movements, normal rapid alternating patting, normal foot taps and normal foot agility.  Cerebellar testing: No dysmetria or intention tremor on finger to nose testing. There is no truncal or gait ataxia.  Sensory exam: unchanged with decreased sensation in the distal lower extremities. Gait, station and balance: He stands easily. No veering to one side is noted. No leaning to one side is noted. Posture is age-appropriate and stance is slightly wide-based, unchanged. Gait shows normal stride length and normal pace with a narrow-based gait. Tandem walk is difficult for him, slightly better today.   Assessment and Plan:  In summary, Tyler Sparks is a very pleasant 75 year old male with an underlying  medical history of A. fib, hypertension, hyperlipidemia, prediabetes, skin cancer, gout, kidney stones, and neuropathy (stable), obesity and hypertensive retinopathy, and recent recurrence of A fib, who presents for followup consultation of his severe obstructive sleep apnea, currently on autoBiPAP, previously on auto CPAP and CPAP of 10 cm, which did not help. Originally in 2015 he was on autoBiPAP and residual AHI was about 17.4/hour at the time. His neuropathy is stable and his exam is stable in that regard. He has lost weight and is congratulated on his weight loss endeavors. He tries to exercise regularly. His auto BiPAP settings shows that 95th percentile of the pressure is about 16/12 cm. We need to increase his pressure setting I think. Ultimately, we may have to bring him back for a BiPAP titration study. Nevertheless, he is fully  compliant with treatment and is commended for this. We still have some work to do as far as optimizing his treatment. He uses a nose mask. I would like to increase his maximum IPAP to 20 cm. I placed an order for this. I will look out for a 30 day download from the date of change. I asked him to try melatonin at night for sleep. We can also try gabapentin. As far as his neuropathy, it does not cause any discomfort or pain. He was diagnosed recently with recurrence of his A. Fib and was placed on Xarelto. I would like to see him back in 3 months, sooner if needed. I answered all his questions today and he was in agreement.  I spent 25 minutes in total face-to-face time with the patient, more than 50% of which was spent in counseling and coordination of care, reviewing test results, reviewing medication and discussing or reviewing the diagnosis of OSA , A. Fib, and neuropathy, the prognosis and treatment options.

## 2016-05-10 ENCOUNTER — Ambulatory Visit (INDEPENDENT_AMBULATORY_CARE_PROVIDER_SITE_OTHER): Payer: Medicare Other | Admitting: Cardiovascular Disease

## 2016-05-10 ENCOUNTER — Encounter: Payer: Self-pay | Admitting: Cardiovascular Disease

## 2016-05-10 VITALS — BP 158/94 | HR 74 | Ht 70.0 in | Wt 254.1 lb

## 2016-05-10 DIAGNOSIS — I712 Thoracic aortic aneurysm, without rupture, unspecified: Secondary | ICD-10-CM

## 2016-05-10 DIAGNOSIS — I481 Persistent atrial fibrillation: Secondary | ICD-10-CM | POA: Diagnosis not present

## 2016-05-10 DIAGNOSIS — I4819 Other persistent atrial fibrillation: Secondary | ICD-10-CM

## 2016-05-10 MED ORDER — RIVAROXABAN 20 MG PO TABS: ORAL_TABLET | ORAL | Status: DC

## 2016-05-10 NOTE — Patient Instructions (Addendum)
Medication Instructions:  Your physician recommends that you continue on your current medications as directed. Please refer to the Current Medication list given to you today.   Labwork: You will need lab work (BMP) a week or so prior to CT.  To be done in early August Testing/Procedures: Non-Cardiac CT Angiography (CTA), is a special type of CT scan that uses a computer to produce multi-dimensional views of major blood vessels throughout the body. In CT angiography, a contrast material is injected through an IV to help visualize the blood vessels. To be done in early August  Follow-Up:  Your physician wants you to follow-up in: 12 months.  You will receive a reminder letter in the mail two months in advance. If you don't receive a letter, please call our office to schedule the follow-up appointment.   Any Other Special Instructions Will Be Listed Below (If Applicable).     If you need a refill on your cardiac medications before your next appointment, please call your pharmacy.

## 2016-05-10 NOTE — Progress Notes (Signed)
Chief Complaint  Patient presents with  . Follow-up  . Chest Pain    pt states no chest pain   . Shortness of Breath    no SOB      History of Present Illness: 75 yo male with history of paroxysmal atrial fibrillation, HTN, HLD, gout, OSA here today for cardiac follow up. I met him in July 2015. He had onset of atrial fib with RVR in 2008 and was converted to sinus with IV diltiazem. He had maintained sinus on Cardizem and atenolol but had not been on long term anticoagulation. I saw him 08/18/15 and he was in atrial fib. Xarelto was started. Echo 10/15/15 with normal LV function, no valve disease. Plan was for rate control and anti-coagulation.   He is here today for follow up. He is feeling well. No chest pain or SOB. No palpitations. He is walking three miles per day.   Primary Care Physician: Velna Hatchet, MD   Past Medical History  Diagnosis Date  . HTN (hypertension)   . Hyperlipidemia   . Atrial fibrillation ([place])     2008  . Gout   . Nephrolithiasis   . Sleep apnea   . Peripheral neuropathy ([place])   . Cancer [facility])     skin    Past Surgical History  Procedure Laterality Date  . Skin cancer excision  2008, 07/2014  . Hernia repair  1974  . Tonsilectomy/adenoidectomy with myringotomy  1950  . Arm skin lesion biopsy / excision Right 8 15  . Wisdom tooth extraction      Current Outpatient Prescriptions  Medication Sig Dispense Refill  . allopurinol (ZYLOPRIM) 300 MG tablet Take 300 mg by mouth daily.    Marland Kitchen atenolol (TENORMIN) 25 MG tablet Take 25 mg by mouth daily.    Marland Kitchen atorvastatin (LIPITOR) 10 MG tablet Take 10 mg by mouth daily.    Marland Kitchen co-enzyme Q-10 50 MG capsule Take 50 mg by mouth daily.    . colchicine 0.6 MG tablet Take 0.6 mg by mouth as needed.    . diltiazem (CARDIZEM CD) 180 MG 24 hr capsule Take 180 mg by mouth daily.    Marland Kitchen lisinopril (PRINIVIL,ZESTRIL) 20 MG tablet Take 20 mg by mouth daily.    . Melatonin 3 MG CAPS Take 3 mg by mouth as directed.      . Misc Natural Products (OSTEO BI-FLEX JOINT SHIELD PO) Take 2 tablets by mouth daily.    . Multiple Vitamin (MULTIVITAMIN) tablet Take 1 tablet by mouth daily.    . Omega-3 Fatty Acids (FISH OIL) 1000 MG CAPS Take 1 capsule by mouth daily.    . rivaroxaban (XARELTO) 20 MG TABS tablet TAKE ONE TABLET (20 MG) BY MOUTH DAILY WITH SUPPER. 90 tablet 3  . valsartan-hydrochlorothiazide (DIOVAN-HCT) 320-12.5 MG per tablet Take 1 tablet by mouth daily.    . vitamin C (ASCORBIC ACID) 500 MG tablet Take 500 mg by mouth daily.    . vitamin E 1000 UNIT capsule Take 1,000 Units by mouth daily.     No current facility-administered medications for this visit.    Allergies  Allergen Reactions  . Peanuts [Peanut Oil] Anaphylaxis  . Eggs Or Egg-Derived Products Hives, Itching and Swelling    Social History   Social History  . Marital Status: Married    Spouse Name: N/A  . Number of Children: 2  . Years of Education: N/A   Occupational History  . Retired-Linguistics Professor [employer]  Social History Main Topics  . Smoking status: Former Smoker    Quit date: 12/26/1973  . Smokeless tobacco: Never Used  . Alcohol Use: 8.4 oz/week    14 Standard drinks or equivalent per week     Comment: stopped for about 6 weeks, researched and resumed   . Drug Use: No  . Sexual Activity: Not on file   Other Topics Concern  . Not on file   Social History Narrative    Family History  Problem Relation Age of Onset  . Heart attack Father   . Heart attack Mother   . Colon cancer Neg Hx   . Esophageal cancer Neg Hx   . Rectal cancer Neg Hx   . Stomach cancer Neg Hx     Review of Systems:  As stated in the HPI and otherwise negative.   BP 158/94 mmHg  Pulse 74  Ht 5' 10"  (1.778 m)  Wt 254 lb 1.9 oz (115.268 kg)  BMI 36.46 kg/m2  Physical Examination: General: Well developed, well nourished, NAD HEENT: OP clear, mucus membranes moist SKIN: warm, dry. No rashes. Neuro: No focal  deficits Musculoskeletal: Muscle strength 5/5 all ext Psychiatric: Mood and affect normal Neck: No JVD, no carotid bruits, no thyromegaly, no lymphadenopathy. Lungs:Clear bilaterally, no wheezes, rhonci, crackles Cardiovascular: Irregular irregular. No murmurs, gallops or rubs. Abdomen:Soft. Bowel sounds present. Non-tender.  Extremities: No lower extremity edema. Pulses are 2 + in the bilateral DP/PT.  EKG:  EKG is ordered today. The ekg ordered today demonstrates atrial fib, rate 74 bpm. Non-specific ST abnormality.   Recent Labs: 08/18/2015: BUN 31*; Creatinine, Ser 1.34; Hemoglobin 14.6; Platelets 202.0; Potassium 3.4*; Sodium 143    Wt Readings from Last 3 Encounters:  05/10/16 254 lb 1.9 oz (115.268 kg)  04/26/16 256 lb (116.121 kg)  02/04/16 260 lb (117.935 kg)     Other studies Reviewed: Additional studies/ records that were reviewed today include: . Review of the above records demonstrates:   Assessment and Plan:   1. Persistent atrial fibrillation: He is in atrial fib today. He is asymptomatic. His CHADSVASC score is 2. Continue Xarelto 20 mg daily. Continue diltiazem and atenolol. Will use rate control strategy with anti-coagulation since he is asymptomatic. I would only move toward cardioversion/ablation strategies if he became symptomatic.   2. Thoracic aortic aneurysm (ascending aorta): MIld dilation by echo October 2016. Will arrange CTA chest to evaluate further.   Current medicines are reviewed at length with the patient today.  The patient does not have concerns regarding medicines.  The following changes have been made:   Labs/ tests ordered today include  Orders Placed This Encounter  Procedures  . CT ANGIO CHEST AORTA W/CM &/OR WO/CM  . Basic Metabolic Panel (BMET)  . EKG 12-Lead    Disposition:   FU with me in 6 months   Signed, Lauree Chandler, MD 05/10/2016 9:46 AM    [facility] [address] [place], [place], [place]   [address] Phone: [phone]; Fax: [phone]

## 2016-07-26 ENCOUNTER — Other Ambulatory Visit (INDEPENDENT_AMBULATORY_CARE_PROVIDER_SITE_OTHER): Payer: Medicare Other

## 2016-07-26 DIAGNOSIS — I712 Thoracic aortic aneurysm, without rupture, unspecified: Secondary | ICD-10-CM

## 2016-07-26 LAB — BASIC METABOLIC PANEL
BUN: 24 mg/dL (ref 7–25)
CALCIUM: 8.9 mg/dL (ref 8.6–10.3)
CO2: 28 mmol/L (ref 20–31)
Chloride: 106 mmol/L (ref 98–110)
Creat: 1.18 mg/dL (ref 0.70–1.18)
Glucose, Bld: 110 mg/dL — ABNORMAL HIGH (ref 65–99)
POTASSIUM: 3.6 mmol/L (ref 3.5–5.3)
SODIUM: 143 mmol/L (ref 135–146)

## 2016-08-02 ENCOUNTER — Ambulatory Visit (INDEPENDENT_AMBULATORY_CARE_PROVIDER_SITE_OTHER)
Admission: RE | Admit: 2016-08-02 | Discharge: 2016-08-02 | Disposition: A | Discharge status: Home / Self Care | Payer: Medicare Other | Source: Ambulatory Visit | Attending: Cardiovascular Disease | Admitting: Cardiovascular Disease

## 2016-08-02 DIAGNOSIS — I712 Thoracic aortic aneurysm, without rupture, unspecified: Secondary | ICD-10-CM

## 2016-08-02 MED ORDER — IOPAMIDOL (ISOVUE-370) INJECTION 76%
100.0000 mL | Freq: Once | INTRAVENOUS | Status: AC | PRN
  Administered 2016-08-02: 100 mL via INTRAVENOUS

## 2016-08-03 ENCOUNTER — Telehealth: Payer: Self-pay | Admitting: Cardiovascular Disease

## 2016-08-03 DIAGNOSIS — I712 Thoracic aortic aneurysm, without rupture, unspecified: Secondary | ICD-10-CM

## 2016-08-03 NOTE — Telephone Encounter (Signed)
Patient informed of CT scan results. He said he didn't know that there was any dilation of his aorta before this.  Adv him that Dr. Angelena Form was aware and is following and that is why the CT scan was done.  Advised CT scan to be repeated in 1 year.    He asked if this impacts his regular exercise program in any way.  Advised to continue his normal exercise.  Per last OV note, scheduled appointment for patient for November with Dr. Angelena Form.

## 2016-08-03 NOTE — Telephone Encounter (Signed)
Follow up    Tyler Sparks  is returning a call about test results

## 2016-08-09 ENCOUNTER — Encounter: Payer: Self-pay | Admitting: Neurology

## 2016-08-09 ENCOUNTER — Ambulatory Visit (INDEPENDENT_AMBULATORY_CARE_PROVIDER_SITE_OTHER): Payer: Medicare Other | Admitting: Neurology

## 2016-08-09 VITALS — BP 142/84 | HR 72 | Resp 16 | Ht 70.0 in | Wt 257.0 lb

## 2016-08-09 DIAGNOSIS — G4731 Primary central sleep apnea: Secondary | ICD-10-CM | POA: Diagnosis not present

## 2016-08-09 DIAGNOSIS — G6289 Other specified polyneuropathies: Secondary | ICD-10-CM | POA: Diagnosis not present

## 2016-08-09 DIAGNOSIS — G4761 Periodic limb movement disorder: Secondary | ICD-10-CM

## 2016-08-09 DIAGNOSIS — G4733 Obstructive sleep apnea (adult) (pediatric): Secondary | ICD-10-CM

## 2016-08-09 NOTE — Progress Notes (Signed)
Subjective:    Patient ID: Tyler Sparks is a 75 y.o. male.  HPI     Interim history:    Mr. Tuckerman is a 75 year old right-handed gentleman with an underlying medical history of A. fib, hypertension, hyperlipidemia, prediabetes, skin cancer, gout, kidney stones, neuropathy, obesity and hypertensive retinopathy, who presents for followup consultation of his obstructive sleep apnea and neuropathy, after his recent sleep study. He is unaccompanied today. I last saw him on 04/26/2016, at which time he reported a difficult time with his sleep study in March 2017. He was not able to sleep on his back, had issues with leg pain, sleep study was not reflective of his typical sleep as he was mostly uncomfortable. Nevertheless, he also reported chronic sleep maintenance issues. He was trying to lose weight and had in fact lost about 25 pounds from his maximum weight. He had a little bit less stress as his wife's cancer treatment had been completed and she was doing well. I suggested he continue with auto BiPAP and I increased his maximum IPAP to 20 cm. I suggested he could try melatonin at night for sleep.   Today, 08/09/2016: I reviewed his auto BiPAP compliance data from 07/04/2016 through 08/02/2016 which is a total of 30 days during which time he used his machine every night with percent used days greater than 4 hours at 100%, indicating superb compliance with an average usage for all nights of 6 hours and 34 minutes, residual AHI unfortunately still suboptimal at 18.6, leak borderline with the 95th percentile at 23 L/m, maximum IPAP of 20, minimum EPAP of 4, pressure support of 4 cm, residual AHI mostly obstructive and to a lesser degree central in nature. 95th percentile pressure around 19/15 cm.   Today, 08/09/2016: He reports doing okay, no new issues. Using autoBiPAP with full compliance. Tolerating it. Tries to stay active, avoids alcohol altogether. Has been on Xarelto without SEs. Has been enjoying  time with grandkids. Melatonin may help marginally.   Previously:  I saw him on 02/04/2016, at which time he reported not waking up rested. AutoPap was not very helpful. He was supposed to see his cardiologist. His pulse rate would go low into the 40s and 50s in the mornings. His blood pressure would be okay in the mornings. Cardiology has previously asked him to consider pacemaker but he did not wish to pursue it at the time. He had ongoing stressors what with his wife's cancer treatments but thankfully she was stable. She had completed chemotherapy. The patient agreed to come back for a full night titration study to help optimize his treatment settings. He had a split-night sleep study on 07/21/2014. He had a CPAP titration study on 02/19/2016. Sleep efficiency was 49%, sleep latency 12 minutes, wake after sleep onset was 173.5 minutes with moderate sleep fragmentation noted. His arousal index was elevated, he had an increased percentage of light stage sleep, absence of slow-wave sleep, and a decreased percentage of REM sleep at 14.6% with a prolonged REM latency. He had severe PLMS with mild arousals. He had a total of 8 central apneas, 7 obstructive and 42 central hypopneas for the night. Average oxygen saturation was 93%, nadir was 80%. He was started on CPAP with a pressure of 6 cm and titrated to 7 cm but he had ongoing mostly central respiratory events. He was switched to standard BiPAP, starting with 9/5 cm and titrated to 10/6 cm and 11/7 cm, AHI was high. BiPAP ST was tried with a  backup rate of 14, then down to 12/m. He was also tried on a his feet but then could not sleep on ASV. No modality was clearly optimal for him, with the exception that CPAP was not successful. I suggested a trial of Auto BiPAP for now. I also suggested treatment of his PLMD which may help him achieve more consolidated sleep.    I reviewed his auto BiPAP compliance data from 03/23/2016 through 04/21/2016 which is a total of  30 days during which time he used his machine every night with percent used days greater than 4 hours at 100%, indicating superb compliance with an average usage of 6 hours and 25 minutes, minimum EPAP of 4, maximum IPAP of 16, pressure support of 4, leak acceptable with the 95th percentile at 16.4 L/m average AHI suboptimal at 17.8 per hour.     I saw him on 10/09/2015, at which time he was compliant with CPAP therapy but had a residual AHI of 22 per hour, leak acceptable, pressure of 10 cm. I suggested we change his setting to autoPAP and monitor his compliance data. We also talked about potentially bringing him back for a CPAP titration study to optimize treatment.   I reviewed his AutoPap compliance data from 01/04/2016 through 02/02/2016 which is a total of 30 days during which time he used his machine every day with percent used days greater than 4 hours at 100%, indicating superb compliance with an average usage of 6 hours and 57 minutes, residual AHI at 18 per hour, 95th percentile pressure at 15.7, leak at times high with the 95th percentile at 26.3 L/m, range of 7 cm to 16 cm. Residual AHI breakdown indicates mostly central apneas at 7 per hour. His obstructive apnea index is 4.6 per hour.    I saw him on 04/08/2015, at which time he reported more seasonal allergy symptoms and cough as well as postnasal drip. He had issues with left-sided plantar fasciitis. He had stable symptoms of neuropathy.      I reviewed his CPAP compliance data from 09/07/2015 through 10/06/2015 which is a total of 30 days during which time he used his machine every night with percent used days greater than 4 hours at 100%, indicating superb compliance with an average usage of 6 hours and 45 minutes, residual AHI high at 22.3 per hour, leak acceptable for the 95th percentile at 8.4 L/m on a pressure of 10 cm. Most of his residual sleep-disordered breathing appears to be obstructive in nature with an obstructive apnea index  of 11.6.   I saw him on 10/07/2014, at which time his AHI was suboptimal at 8 per hour on a pressure of 9 cm and I suggested we increase his pressure to 10 cm.   I reviewed his CPAP compliance data from 03/08/2015 through 04/06/2015 which is a total of 30 days during which time he used his machine every night with percent used days greater than 4 hours at 97%, indicating excellent compliance with an average usage of 7 hours and 33 minutes and residual AHI is suboptimal at 12 per hour with leak low and the 95th percentile of leak at 4.9 L/m on a pressure of 10 cm.   I first met him on 06/25/2014 at the request of his primary care physician, at which time the patient reported a diagnosis of obstructive sleep apnea at least 10 years prior and he was using a CPAP machine. He was also diagnosed with peripheral neuropathy over 10 years  ago and had workup when he was still residing in [place], [place]. He was on auto BiPAP. He reported paresthesias and numbness in his feet up to the ankles for the past 10+ years. He reported no pain. He complained of balance problems. He had not had an EMG or nerve conduction test. He reported a family history of neuropathy in his mother and a cousin. At the time of his first visit I suggested reevaluation of his obstructive sleep apnea and invited him back for sleep study. He also was advised to have an EMG nerve conduction test. He had this on and 07/22/2014, and it showed mild length-dependent axonal peripheral neuropathy. We also did blood work. This showed a normal TSH, normal vitamin D, normal ANA, normal CRP, normal RPR, his hemoglobin A1c was elevated at 6.0 which was borderline and his rheumatoid factor was elevated at 49. When we called him with his blood test results he indicated that he had seen a rheumatologist in [place] because of similar test results by his primary care physician at the time.   He had a split-night sleep study on 07/21/2014 and went  over his test results with him in detail today. Baseline sleep efficiency was reduced at 59.9% with a prolonged latency to sleep of 25 minutes and wake after sleep onset of 30 minutes with moderate to severe sleep fragmentation noted. He had an elevated arousal index. He had an elevated percentage of light stage sleep and absence of slow-wave and REM sleep. He had occasional PVCs. He had moderate snoring. Total AHI was 56.3 per hour. Baseline oxygen saturation was 91%, nadir was 81%. He was started on CPAP of 5 cm and titrated up to 10 cm. His AHI was reduced to 0 per hour on a pressure of 9 cm. He achieved a sleep efficiency of 55%. Arousal index was improved. He had an increased percentage of stage I sleep, absence of slow-wave sleep and an increased percentage of REM sleep at 40.7%. Average oxygen saturation on CPAP was 92%, nadir was 86%. Snoring was eliminated. He was noted to have severe periodic leg movements of sleep after CPAP was started and moderate periodic leg movements before CPAP was started. He associated PLM arousal index was also mildly elevated.     I reviewed the patient's CPAP compliance data from 08/12/2014 to 09/10/2014, which is a total of 30 days, during which time the patient used CPAP every day. The average usage for all days was 7 hours and 6 minutes. The percent used days greater than 4 hours was 100 %, indicating superb compliance. The residual AHI was elevated at 9.2 per hour, indicating a suboptimal treatment pressure of 9 cwp. Air leak from the mask was very low with the 95th percentile at 0.2 L per minute.  I reviewed his compliance data from 09/07/2014 through 10/06/2014 which is a total of 30 days during which time he used his CPAP every day. Percent used days greater than 4 hours was 100%, indicating superb compliance, leak was low, average usage of 7 hours and 21 minutes, residual AHI at 8 per hour on a pressure of 9 cm.   He relocated from [place], [place]. He was also  diagnosed with peripheral neuropathy over 10 years ago and had workup when he was still in [place]. He had a CPAP in the beginning, for what he describes as severe OSA. He has been using a F&F nasal mask. He has been on a VPAP auto 25 from [place], set  at 5-9 with PS of 4.   He has had paresthesias and numbness in his feet up to the ankles for the past 10 years. He has no pain, but reports that his balance is affected.   He has not had an EMG/NCV. He does have a FHx of neuropathy in his mother and a cousin, he has no siblings.   I reviewed the patient's CPAP compliance data from 12/25/13 to 06/24/14, which is a total of 182 days, during which time the patient used CPAP every day except for 2 days. The average usage for all days was 6 hours and 43 minutes. The percent used days greater than 4 hours was 98%, indicating excellent compliance. The residual AHI was 17.4 per hour, indicating an inadequate treatment setting of 5 cm of min EPAP and 25 cm of max IPAP with PS of 4. Air leak from the mask was acceptable.   His Past Medical History Is Significant For: Past Medical History:  Diagnosis Date  . Atrial fibrillation ([place])    2008  . Cancer ([place])    skin  . Gout   . HTN (hypertension)   . Hyperlipidemia   . Nephrolithiasis   . Peripheral neuropathy ([place])   . Sleep apnea     His Past Surgical History Is Significant For: Past Surgical History:  Procedure Laterality Date  . ARM SKIN LESION BIOPSY / EXCISION Right 8 15  . HERNIA REPAIR  1974  . SKIN CANCER EXCISION  2008, 07/2014  . TONSILECTOMY/ADENOIDECTOMY WITH MYRINGOTOMY  1950  . WISDOM TOOTH EXTRACTION      His Family History Is Significant For: Family History  Problem Relation Age of Onset  . Heart attack Father   . Heart attack Mother   . Colon cancer Neg Hx   . Esophageal cancer Neg Hx   . Rectal cancer Neg Hx   . Stomach cancer Neg Hx     His Social History Is Significant For: Social History   Social History  . Marital  status: Married    Spouse name: N/A  . Number of children: 2  . Years of education: N/A   Occupational History  . Retired-Linguistics Professor [employer]    Social History Main Topics  . Smoking status: Former Smoker    Quit date: 12/26/1973  . Smokeless tobacco: Never Used  . Alcohol use 8.4 oz/week    14 Standard drinks or equivalent per week     Comment: stopped for about 6 weeks, researched and resumed   . Drug use: No  . Sexual activity: Not Asked   Other Topics Concern  . None   Social History Narrative  . None    His Allergies Are:  Allergies  Allergen Reactions  . Peanuts [Peanut Oil] Anaphylaxis  . Eggs Or Egg-Derived Products Hives, Itching and Swelling  :   His Current Medications Are:  Outpatient Encounter Prescriptions as of 08/09/2016  Medication Sig  . allopurinol (ZYLOPRIM) 300 MG tablet Take 300 mg by mouth daily.  Marland Kitchen atenolol (TENORMIN) 25 MG tablet Take 25 mg by mouth daily.  Marland Kitchen atorvastatin (LIPITOR) 10 MG tablet Take 10 mg by mouth daily.  Marland Kitchen co-enzyme Q-10 50 MG capsule Take 50 mg by mouth daily.  . colchicine 0.6 MG tablet Take 0.6 mg by mouth as needed.  . diltiazem (CARDIZEM CD) 180 MG 24 hr capsule Take 180 mg by mouth daily.  Marland Kitchen lisinopril (PRINIVIL,ZESTRIL) 20 MG tablet Take 20 mg by mouth daily.  . Melatonin  3 MG CAPS Take 3 mg by mouth as directed.  . Misc Natural Products (OSTEO BI-FLEX JOINT SHIELD PO) Take 2 tablets by mouth daily.  . Multiple Vitamin (MULTIVITAMIN) tablet Take 1 tablet by mouth daily.  . Omega-3 Fatty Acids (FISH OIL) 1000 MG CAPS Take 1 capsule by mouth daily.  . rivaroxaban (XARELTO) 20 MG TABS tablet TAKE ONE TABLET (20 MG) BY MOUTH DAILY WITH SUPPER.  . valsartan-hydrochlorothiazide (DIOVAN-HCT) 320-12.5 MG per tablet Take 1 tablet by mouth daily.  . vitamin C (ASCORBIC ACID) 500 MG tablet Take 500 mg by mouth daily.  . vitamin E 1000 UNIT capsule Take 1,000 Units by mouth daily.   No facility-administered encounter  medications on file as of 08/09/2016.   :  Review of Systems:  Out of a complete 14 point review of systems, all are reviewed and negative with the exception of these symptoms as listed below: Review of Systems  Neurological:       Patient is here for f/u. Per patient no new concerns.     Objective:  Neurologic Exam  Physical Exam Physical Examination:   Vitals:   08/09/16 0927  BP: (!) 142/84  Pulse: 72  Resp: 16    General Examination: The patient is a very pleasant 75 y.o. male in no acute distress. He appears well-developed and well-nourished and well groomed.   HEENT: Normocephalic, atraumatic, pupils are equal, round and reactive to light and accommodation. Extraocular tracking is good without limitation to gaze excursion or nystagmus noted. Normal smooth pursuit is noted. Bilateral cataracts are noted. Hearing is grossly intact. Face is symmetric with normal facial animation and normal facial sensation. Speech is clear with no dysarthria noted. There is no hypophonia. There is no lip, neck/head, jaw or voice tremor. Neck is supple with full range of passive and active motion. There are no carotid bruits on auscultation. Oropharynx exam reveals:  No significant mouth dryness, adequate dental hygiene and moderate airway crowding, due to larger tone and narrow airway entry. Mallampati is class III. Tongue protrudes centrally and palate elevates symmetrically. Tonsils are absent. He has a Mild overbite. Nasal inspection reveals no significant nasal mucosal bogginess or redness and no septal deviation. He has some scars on his nasal bridge.   Chest: Clear to auscultation without wheezing, rhonchi or crackles noted.  Heart: S1+S2+0, regular and normal without murmurs, rubs or gallops noted.   Abdomen: Soft, non-tender and non-distended with normal bowel sounds appreciated on auscultation.  Extremities: There is trace pitting edema in the distal lower extremities bilaterally, around  the ankles, left more noticeable, stable. Pedal pulses are intact.  Skin: Warm and dry without trophic changes noted. There are no varicose veins. He has sun-exposure related changes on his scalp and actinic keratosis.   Musculoskeletal: exam reveals no obvious joint deformities, tenderness or joint swelling or erythema.   Neurologically:  Mental status: The patient is awake, alert and oriented in all 4 spheres. His immediate and remote memory, attention, language skills and fund of knowledge are appropriate. There is no evidence of aphasia, agnosia, apraxia or anomia. Speech is clear with normal prosody and enunciation. Thought process is linear. Mood is normal and affect is normal.  Cranial nerves II - XII are as described above under HEENT exam. In addition: shoulder shrug is normal with equal shoulder height noted. Motor exam: Normal bulk, strength and tone is noted. There is no drift, tremor or rebound. Romberg is shows minimal sway. Reflexes are 2+ in the  upper extremities, 1+ in the knees and absent in the ankles. Fine motor skills and coordination: intact with normal finger taps, normal hand movements, normal rapid alternating patting, normal foot taps and normal foot agility.  Cerebellar testing: No dysmetria or intention tremor on finger to nose testing. There is no truncal or gait ataxia.  Sensory exam: unchanged with decreased sensation in the distal lower extremities. Gait, station and balance: He stands easily. No veering to one side is noted. No leaning to one side is noted. Posture is age-appropriate and stance is slightly wide-based, unchanged. Gait shows normal stride length and normal pace with a narrow-based gait. Tandem walk is good today.   Assessment and Plan:  In summary, Keyon Liller is a very pleasant 76 year old male with an underlying medical history of A. fib, hypertension, hyperlipidemia, prediabetes, skin cancer, gout, kidney stones, and neuropathy (stable), obesity  and hypertensive retinopathy, and recent recurrence of A fib, (on Xarelto), who presents for followup consultation of his severe obstructive sleep apnea, currently on autoBiPAP, previously on autoPAP and CPAP of 9 cm, then 10 cm, with Gradual worsening of his sleep apnea. Originally in 2015 he was on autoBiPAP and residual AHI was about 17.4/hour at the time. His neuropathy is stable and his exam is stable in that regard. He has lost weight with time and tries to exercise regularly. His auto BiPAP settings shows that 95th percentile of the pressure is about 19/15 cm. last time increased his maximum IPAP to 20 cm. I would like to see if we can try him on a set pressure of 21/17. He's willing to try this. We will fax the order to his DME company and he is requested to check back with [place] in about a month so we can look at his 30 day download at the time.  He can continue utilizing melatonin at night for sleep. I suggested a 4 month checkup, sooner as needed. He is reminded to call or email [place] in about a month for a BiPAP download. I answered all his questions today and he was in agreement.  I spent 25 minutes in total face-to-face time with the patient, more than 50% of which was spent in counseling and coordination of care, reviewing test results, reviewing medication and discussing or reviewing the diagnosis of OSA , A. Fib, and neuropathy, the prognosis and treatment options.

## 2016-08-09 NOTE — Patient Instructions (Addendum)
We have had a difficult time with your sleep apnea control. You are currently on autoBiPAP, and fully compliant with it. Keep up the good work! I would like to try you back on a set pressure: 21/17 cm. We can make the change through your DME company.  We will recheck your download in about 30 days after the change. Please email me in a month through [place].

## 2016-08-15 ENCOUNTER — Encounter: Payer: Self-pay | Admitting: Neurology

## 2016-08-16 NOTE — Telephone Encounter (Signed)
I emailed [facility] and this is the message  Shelby Dubin, RN; Lonzo Cloud morning Beverlee Nims,  I am sorry for this delay. I do see that the order was placed on 8/22. I have sent a message to our RT dept to make sure that they follow up with pt today.   Thanks.  Angie   Previous Messages    ----- Message -----  From: Laurence Spates, RN  Sent: 08/16/2016  7:54 AM  To: Kayren Eaves [place]   Patient contact us about orders that were sent in 8/21, states that he has not heard anything from [facility] yet. I advised him that I will contact [facility] and I am not sure how long it takes to process an order? I also gave him [facility] contact number for further questions.  Thank you for your time.

## 2016-09-23 ENCOUNTER — Telehealth: Payer: Self-pay | Admitting: Neurology

## 2016-09-23 ENCOUNTER — Encounter: Payer: Self-pay | Admitting: Neurology

## 2016-09-23 DIAGNOSIS — G4731 Primary central sleep apnea: Secondary | ICD-10-CM

## 2016-09-23 DIAGNOSIS — G4733 Obstructive sleep apnea (adult) (pediatric): Secondary | ICD-10-CM

## 2016-09-23 NOTE — Telephone Encounter (Signed)
I sent a [facility] message back to the patient letting him know the new orders. I advised him to contact us back if any further problems.

## 2016-09-23 NOTE — Telephone Encounter (Signed)
Please call patient:  I reviewed patient's BiPAP compliance data and he also emailed me for an update. I suggest he meet with his DME provider for a mask refit as the leak is still too high. In addition, would like to increase his BiPAP setting to 22/18 from the current 21/17 cm pressure. Residual AHI elevated at 18.5 per hour

## 2016-10-18 ENCOUNTER — Encounter: Payer: Self-pay | Admitting: Neurology

## 2016-11-10 ENCOUNTER — Encounter: Payer: Self-pay | Admitting: Cardiovascular Disease

## 2016-11-10 ENCOUNTER — Ambulatory Visit (INDEPENDENT_AMBULATORY_CARE_PROVIDER_SITE_OTHER): Payer: Medicare Other | Admitting: Cardiovascular Disease

## 2016-11-10 VITALS — BP 150/84 | HR 80 | Ht 70.0 in | Wt 258.8 lb

## 2016-11-10 DIAGNOSIS — I712 Thoracic aortic aneurysm, without rupture, unspecified: Secondary | ICD-10-CM

## 2016-11-10 DIAGNOSIS — I4819 Other persistent atrial fibrillation: Secondary | ICD-10-CM

## 2016-11-10 DIAGNOSIS — I481 Persistent atrial fibrillation: Secondary | ICD-10-CM | POA: Diagnosis not present

## 2016-11-10 MED ORDER — RIVAROXABAN 20 MG PO TABS: ORAL_TABLET | ORAL | 3 refills | Status: DC

## 2016-11-10 NOTE — Progress Notes (Signed)
Chief Complaint  Patient presents with  . Atrial Fibrillation     History of Present Illness: 75 yo male with history of paroxysmal atrial fibrillation, HTN, HLD, gout, OSA, thoracic aortic aneurysm here today for cardiac follow up. I met him in July 2015. He had onset of atrial fib with RVR in 2008 and was converted to sinus with IV diltiazem. He had maintained sinus on Cardizem and atenolol but had not been on long term anticoagulation. I saw him 08/18/15 and he was in atrial fib. Xarelto was started. Echo 10/15/15 with normal LV function, no valve disease. Plan was for rate control and anti-coagulation. He is known to have a mildly dilated aortic root, 4.3 cm by CTA August 2017.   He is here today for follow up. He is feeling well. No chest pain or SOB. No palpitations. He is walking several days per week and working out with weights. No LE edema.     Primary Care Physician: Velna Hatchet, MD   Past Medical History:  Diagnosis Date  . Atrial fibrillation ([place])    2008  . Cancer ([place])    skin  . Gout   . HTN (hypertension)   . Hyperlipidemia   . Nephrolithiasis   . Peripheral neuropathy ([place])   . Sleep apnea     Past Surgical History:  Procedure Laterality Date  . ARM SKIN LESION BIOPSY / EXCISION Right 8 15  . HERNIA REPAIR  1974  . SKIN CANCER EXCISION  2008, 07/2014  . TONSILECTOMY/ADENOIDECTOMY WITH MYRINGOTOMY  1950  . WISDOM TOOTH EXTRACTION      Current Outpatient Prescriptions  Medication Sig Dispense Refill  . allopurinol (ZYLOPRIM) 300 MG tablet Take 300 mg by mouth daily.    Marland Kitchen atenolol (TENORMIN) 25 MG tablet Take 25 mg by mouth daily.    Marland Kitchen atorvastatin (LIPITOR) 10 MG tablet Take 10 mg by mouth daily.    Marland Kitchen co-enzyme Q-10 50 MG capsule Take 50 mg by mouth daily.    . colchicine 0.6 MG tablet Take 0.6 mg by mouth daily as needed (gout flare).     Marland Kitchen diltiazem (CARDIZEM CD) 180 MG 24 hr capsule Take 180 mg by mouth daily.    Marland Kitchen lisinopril (PRINIVIL,ZESTRIL) 20  MG tablet Take 20 mg by mouth daily.    . Melatonin 3 MG CAPS Take 3 mg by mouth as directed.    . Misc Natural Products (OSTEO BI-FLEX JOINT SHIELD PO) Take 2 tablets by mouth daily.    . Multiple Vitamin (MULTIVITAMIN) tablet Take 1 tablet by mouth daily.    . Omega-3 Fatty Acids (FISH OIL) 1000 MG CAPS Take 1 capsule by mouth daily.    . rivaroxaban (XARELTO) 20 MG TABS tablet TAKE ONE TABLET (20 MG) BY MOUTH DAILY WITH SUPPER. 90 tablet 3  . valsartan-hydrochlorothiazide (DIOVAN-HCT) 320-12.5 MG per tablet Take 1 tablet by mouth daily.    . vitamin C (ASCORBIC ACID) 500 MG tablet Take 500 mg by mouth daily.    . vitamin E 1000 UNIT capsule Take 1,000 Units by mouth daily.     No current facility-administered medications for this visit.     Allergies  Allergen Reactions  . Peanuts [Peanut Oil] Anaphylaxis  . Eggs Or Egg-Derived Products Hives, Itching and Swelling    Social History   Social History  . Marital status: Married    Spouse name: N/A  . Number of children: 2  . Years of education: N/A   Occupational History  .  Retired-Linguistics Professor [employer]    Social History Main Topics  . Smoking status: Former Smoker    Quit date: 12/26/1973  . Smokeless tobacco: Never Used  . Alcohol use 8.4 oz/week    14 Standard drinks or equivalent per week     Comment: stopped for about 6 weeks, researched and resumed   . Drug use: No  . Sexual activity: Not on file   Other Topics Concern  . Not on file   Social History Narrative  . No narrative on file    Family History  Problem Relation Age of Onset  . Heart attack Father   . Heart attack Mother   . Colon cancer Neg Hx   . Esophageal cancer Neg Hx   . Rectal cancer Neg Hx   . Stomach cancer Neg Hx     Review of Systems:  As stated in the HPI and otherwise negative.   BP (!) 150/84   Pulse 80   Ht 5' 10" (1.778 m)   Wt 258 lb 12.8 oz (117.4 kg)   BMI 37.13 kg/m   Physical Examination: General: Well developed,  well nourished, NAD  HEENT: OP clear, mucus membranes moist  SKIN: warm, dry. No rashes. Neuro: No focal deficits  Musculoskeletal: Muscle strength 5/5 all ext  Psychiatric: Mood and affect normal  Neck: No JVD, no carotid bruits, no thyromegaly, no lymphadenopathy.  Lungs:Clear bilaterally, no wheezes, rhonci, crackles Cardiovascular: Irregular irregular. No murmurs, gallops or rubs. Abdomen:Soft. Bowel sounds present. Non-tender.  Extremities: No lower extremity edema. Pulses are 2 + in the bilateral DP/PT.  EKG:  EKG is not ordered today. The ekg ordered today demonstrates   Recent Labs: 07/26/2016: BUN 24; Creat 1.18; Potassium 3.6; Sodium 143    Wt Readings from Last 3 Encounters:  11/10/16 258 lb 12.8 oz (117.4 kg)  08/09/16 257 lb (116.6 kg)  05/10/16 254 lb 1.9 oz (115.3 kg)     Other studies Reviewed: Additional studies/ records that were reviewed today include: . Review of the above records demonstrates:   Assessment and Plan:   1. Persistent atrial fibrillation: He is in atrial fib today. He is asymptomatic. His CHADSVASC score is 2. Continue Xarelto 20 mg daily. Continue diltiazem and atenolol. Will use rate control strategy with anti-coagulation since he is asymptomatic. I would only move toward cardioversion/ablation strategies if he became symptomatic.   2. Thoracic aortic aneurysm (ascending aorta): MIld dilation by CTA August 2017. Will arrange repeat CTA chest August 2018.    3. Coronary artery calcification: Noted on CTA chest. He has no symptoms c/w angina and is very active. LIkely that this represents calcification of his artery wall only without significant luminal obstruction. We have reviewed this today.   Current medicines are reviewed at length with the patient today.  The patient does not have concerns regarding medicines.  The following changes have been made:   Labs/ tests ordered today include  No orders of the defined types were placed in this  encounter.   Disposition:   FU with me in 12 months   Signed, Lauree Chandler, MD 11/10/2016 9:21 AM    [address], [place], [place]  [address] Phone: [phone]; Fax: [phone]

## 2016-11-10 NOTE — Patient Instructions (Signed)
Medication Instructions:  Your physician recommends that you continue on your current medications as directed. Please refer to the Current Medication list given to you today.   Labwork: Lab work scheduled for August 8,2018--BMP.  This is in preparation for CT  Testing/Procedures: Non-Cardiac CT scanning, (CAT scanning), is a noninvasive, special x-ray that produces cross-sectional images of the body using x-rays and a computer. CT scans help physicians diagnose and treat medical conditions. For some CT exams, a contrast material is used to enhance visibility in the area of the body being studied. CT scans provide greater clarity and reveal more details than regular x-ray exams. Scheduled for August 15,2018  Follow-Up: Your physician recommends that you schedule a follow-up appointment in: 12 months.  Please call us in August to schedule this appointment.     Any Other Special Instructions Will Be Listed Below (If Applicable).     If you need a refill on your cardiac medications before your next appointment, please call your pharmacy.

## 2016-12-03 DIAGNOSIS — I712 Thoracic aortic aneurysm, without rupture, unspecified: Secondary | ICD-10-CM | POA: Insufficient documentation

## 2016-12-06 ENCOUNTER — Ambulatory Visit: Payer: Medicare Other | Admitting: Neurology

## 2017-07-26 ENCOUNTER — Other Ambulatory Visit: Payer: Medicare Other

## 2017-07-27 ENCOUNTER — Other Ambulatory Visit: Payer: Medicare Other

## 2017-07-27 ENCOUNTER — Other Ambulatory Visit: Payer: Self-pay | Admitting: *Deleted

## 2017-07-27 DIAGNOSIS — I10 Essential (primary) hypertension: Secondary | ICD-10-CM

## 2017-07-27 LAB — BASIC METABOLIC PANEL
BUN/Creatinine Ratio: 25 — ABNORMAL HIGH (ref 10–24)
BUN: 25 mg/dL (ref 8–27)
CALCIUM: 8.9 mg/dL (ref 8.6–10.2)
CO2: 24 mmol/L (ref 20–29)
Chloride: 104 mmol/L (ref 96–106)
Creatinine, Ser: 1.01 mg/dL (ref 0.76–1.27)
GFR, EST AFRICAN AMERICAN: 84 mL/min/{1.73_m2} (ref >59)
GFR, EST NON AFRICAN AMERICAN: 72 mL/min/{1.73_m2} (ref >59)
Glucose: 72 mg/dL (ref 65–99)
Potassium: 3.7 mmol/L (ref 3.5–5.2)
Sodium: 144 mmol/L (ref 134–144)

## 2017-08-03 ENCOUNTER — Ambulatory Visit (INDEPENDENT_AMBULATORY_CARE_PROVIDER_SITE_OTHER)
Admission: RE | Admit: 2017-08-03 | Discharge: 2017-08-03 | Disposition: A | Discharge status: Home / Self Care | Payer: Medicare Other | Source: Ambulatory Visit | Attending: Cardiovascular Disease | Admitting: Cardiovascular Disease

## 2017-08-03 DIAGNOSIS — I712 Thoracic aortic aneurysm, without rupture, unspecified: Secondary | ICD-10-CM

## 2017-08-03 MED ORDER — IOPAMIDOL (ISOVUE-370) INJECTION 76%: 100.0000 mL | Freq: Once | INTRAVENOUS | Status: DC | PRN

## 2017-11-16 NOTE — Progress Notes (Signed)
Chief Complaint  Patient presents with  . Atrial Fibrillation     History of Present Illness: 76 yo male with history of paroxysmal atrial fibrillation, HTN, HLD, gout, OSA, thoracic aortic aneurysm here today for cardiac follow up. I met him in July 2015. He had onset of atrial fib with RVR in 2008 and was converted to sinus with IV diltiazem. He had maintained sinus on Cardizem and atenolol but had not been on long term anticoagulation. I saw him 08/18/15 and he was in atrial fib. Xarelto was started. Echo 10/15/15 with normal LV function, no valve disease. Plan was for rate control and anti-coagulation. He is known to have a mildly dilated aortic root, 4.2 cm by CTA August 2018.   He is here today for follow up. The patient denies any chest pain, dyspnea, palpitations, lower extremity edema, orthopnea, PND, dizziness, near syncope or syncope. He is exercising every day.   Primary Care Physician: Velna Hatchet, MD   Past Medical History:  Diagnosis Date  . Atrial fibrillation ([place])    2008  . Cancer ([place])    skin  . Gout   . HTN (hypertension)   . Hyperlipidemia   . Nephrolithiasis   . Peripheral neuropathy   . Sleep apnea     Past Surgical History:  Procedure Laterality Date  . ARM SKIN LESION BIOPSY / EXCISION Right 8 15  . HERNIA REPAIR  1974  . SKIN CANCER EXCISION  2008, 07/2014  . TONSILECTOMY/ADENOIDECTOMY WITH MYRINGOTOMY  1950  . WISDOM TOOTH EXTRACTION      Current Outpatient Medications  Medication Sig Dispense Refill  . allopurinol (ZYLOPRIM) 300 MG tablet Take 300 mg by mouth daily.    Marland Kitchen atenolol (TENORMIN) 25 MG tablet Take 25 mg by mouth daily.    Marland Kitchen atorvastatin (LIPITOR) 10 MG tablet Take 10 mg by mouth daily.    Marland Kitchen co-enzyme Q-10 50 MG capsule Take 50 mg by mouth daily.    . colchicine 0.6 MG tablet Take 0.6 mg by mouth daily as needed (gout flare).     Marland Kitchen diltiazem (CARDIZEM CD) 180 MG 24 hr capsule Take 180 mg by mouth daily.    Marland Kitchen lisinopril  (PRINIVIL,ZESTRIL) 20 MG tablet Take 20 mg by mouth daily.    . Melatonin 3 MG CAPS Take 3 mg by mouth as directed.    . Misc Natural Products (OSTEO BI-FLEX JOINT SHIELD PO) Take 2 tablets by mouth daily.    . Multiple Vitamin (MULTIVITAMIN) tablet Take 1 tablet by mouth daily.    . Omega-3 Fatty Acids (FISH OIL) 1000 MG CAPS Take 1 capsule by mouth daily.    . rivaroxaban (XARELTO) 20 MG TABS tablet TAKE ONE TABLET (20 MG) BY MOUTH DAILY WITH SUPPER. 90 tablet 3  . valsartan-hydrochlorothiazide (DIOVAN-HCT) 320-12.5 MG per tablet Take 1 tablet by mouth daily.    . vitamin C (ASCORBIC ACID) 500 MG tablet Take 500 mg by mouth daily.    . vitamin E 1000 UNIT capsule Take 1,000 Units by mouth daily.     No current facility-administered medications for this visit.     Allergies  Allergen Reactions  . Peanuts [Peanut Oil] Anaphylaxis  . Eggs Or Egg-Derived Products Hives, Itching and Swelling    Social History   Socioeconomic History  . Marital status: Married    Spouse name: Not on file  . Number of children: 2  . Years of education: Not on file  . Highest education level: Not  on file  Social Needs  . Financial resource strain: Not on file  . Food insecurity - worry: Not on file  . Food insecurity - inability: Not on file  . Transportation needs - medical: Not on file  . Transportation needs - non-medical: Not on file  Occupational History  . Occupation: Retired-Linguistics Professor [employer]  Tobacco Use  . Smoking status: Former Smoker    Last attempt to quit: 12/26/1973    Years since quitting: 43.9  . Smokeless tobacco: Never Used  Substance and Sexual Activity  . Alcohol use: Yes    Alcohol/week: 8.4 oz    Types: 14 Standard drinks or equivalent per week    Comment: stopped for about 6 weeks, researched and resumed   . Drug use: No  . Sexual activity: Not on file  Other Topics Concern  . Not on file  Social History Narrative  . Not on file    Family History  Problem  Relation Age of Onset  . Heart attack Father   . Heart attack Mother   . Colon cancer Neg Hx   . Esophageal cancer Neg Hx   . Rectal cancer Neg Hx   . Stomach cancer Neg Hx     Review of Systems:  As stated in the HPI and otherwise negative.   BP (!) 142/80   Pulse 66   Ht 5' 10"  (1.778 m)   Wt 275 lb (124.7 kg)   BMI 39.46 kg/m   Physical Examination:  General: Well developed, well nourished, NAD  HEENT: OP clear, mucus membranes moist  SKIN: warm, dry. No rashes. Neuro: No focal deficits  Musculoskeletal: Muscle strength 5/5 all ext  Psychiatric: Mood and affect normal  Neck: No JVD, no carotid bruits, no thyromegaly, no lymphadenopathy.  Lungs:Clear bilaterally, no wheezes, rhonci, crackles Cardiovascular: Irreg irreg. No murmurs, gallops or rubs. Abdomen:Soft. Bowel sounds present. Non-tender.  Extremities: No lower extremity edema. Pulses are 2 + in the bilateral DP/PT.  EKG:  EKG is ordered today. The ekg ordered today demonstrates Atrial fib, rate 66 bpm.  Recent Labs: 07/27/2017: BUN 25; Creatinine, Ser 1.01; Potassium 3.7; Sodium 144    Wt Readings from Last 3 Encounters:  11/17/17 275 lb (124.7 kg)  11/10/16 258 lb 12.8 oz (117.4 kg)  08/09/16 257 lb (116.6 kg)     Other studies Reviewed: Additional studies/ records that were reviewed today include: . Review of the above records demonstrates:   Assessment and Plan:   1. Persistent atrial fibrillation: He is in atrial fib today. Will continue atenolol and Cardizem for rate control and Xarelto for anti-coagulation. Since he is asymptomatic, will not plan cardioversion.    2. Thoracic aortic aneurysm (ascending aorta): Mild dilation of ascending aorta by CTA August 2018. Will repeat in August 2019.   3. Coronary artery calcification: Noted on CTA chest. He has no symptoms c/w angina and is very active. LIkely that this represents calcification of his artery wall only without significant luminal obstruction.     4. HTN: BP is controlled. This is followed in primary care. He has been on Lisinopril and Diovan per his primary care doctor. I have asked him to review this with his primary care doctor.   Current medicines are reviewed at length with the patient today.  The patient does not have concerns regarding medicines.  The following changes have been made:   Labs/ tests ordered today include  Orders Placed This Encounter  Procedures  . CT ANGIO CHEST  AORTA W &/OR WO CONTRAST  . Basic Metabolic Panel (BMET)  . EKG 12-Lead    Disposition:   FU with me in 12 months   Signed, Lauree Chandler, MD 11/17/2017 8:41 AM    [facility] [address], [place], [place]  [address] Phone: [phone]; Fax: [phone]

## 2017-11-17 ENCOUNTER — Ambulatory Visit: Payer: Medicare Other | Admitting: Cardiovascular Disease

## 2017-11-17 ENCOUNTER — Encounter: Payer: Self-pay | Admitting: Cardiovascular Disease

## 2017-11-17 VITALS — BP 142/80 | HR 66 | Ht 70.0 in | Wt 275.0 lb

## 2017-11-17 DIAGNOSIS — I481 Persistent atrial fibrillation: Secondary | ICD-10-CM

## 2017-11-17 DIAGNOSIS — I712 Thoracic aortic aneurysm, without rupture, unspecified: Secondary | ICD-10-CM

## 2017-11-17 DIAGNOSIS — I4819 Other persistent atrial fibrillation: Secondary | ICD-10-CM

## 2017-11-17 MED ORDER — RIVAROXABAN 20 MG PO TABS: ORAL_TABLET | ORAL | 3 refills | Status: DC

## 2017-11-17 NOTE — Patient Instructions (Addendum)
Medication Instructions:  Your physician recommends that you continue on your current medications as directed. Please refer to the Current Medication list given to you today.   Labwork: Your physician recommends that you return for lab work on August 12,2019.  (BMP)  The lab opens at 7:30 AM   Testing/Procedures: Non-Cardiac CT Angiography (CTA), is a special type of CT scan that uses a computer to produce multi-dimensional views of major blood vessels throughout the body. In CT angiography, a contrast material is injected through an IV to help visualize the blood vessels.  Scheduled for August 19,2019 at 8:30.  Please arrive at 8:15.  No solid foods for 2 hours prior to exam.   Follow-Up: Your physician recommends that you schedule a follow-up appointment in: 12 months. Please call our office in August 2019 to schedule this appointment    Any Other Special Instructions Will Be Listed Below (If Applicable).   Have lab work done at primary care sent to our office.  Fax is [phone]  If you need a refill on your cardiac medications before your next appointment, please call your pharmacy.

## 2017-11-19 ENCOUNTER — Other Ambulatory Visit: Payer: Self-pay | Admitting: Cardiovascular Disease

## 2017-11-21 NOTE — Telephone Encounter (Signed)
Xarelto 20mg  refill request received; pt is 76 yrs, wt- 124.7kg, Crea-1.01 on 07/27/17, last [facility] by [facility] on 11/17/17, CrCl-111.25ml/min; will send in refill request to requested pharmacy.

## 2018-07-31 ENCOUNTER — Other Ambulatory Visit: Payer: Medicare Other | Admitting: *Deleted

## 2018-07-31 DIAGNOSIS — I712 Thoracic aortic aneurysm, without rupture, unspecified: Secondary | ICD-10-CM

## 2018-07-31 LAB — BASIC METABOLIC PANEL
BUN/Creatinine Ratio: 22 (ref 10–24)
BUN: 26 mg/dL (ref 8–27)
CALCIUM: 8.8 mg/dL (ref 8.6–10.2)
CO2: 23 mmol/L (ref 20–29)
Chloride: 105 mmol/L (ref 96–106)
Creatinine, Ser: 1.19 mg/dL (ref 0.76–1.27)
GFR calc Af Amer: 68 mL/min/{1.73_m2} (ref >59)
GFR, EST NON AFRICAN AMERICAN: 59 mL/min/{1.73_m2} — AB (ref >59)
GLUCOSE: 117 mg/dL — AB (ref 65–99)
POTASSIUM: 3.6 mmol/L (ref 3.5–5.2)
SODIUM: 141 mmol/L (ref 134–144)

## 2018-08-07 ENCOUNTER — Ambulatory Visit (INDEPENDENT_AMBULATORY_CARE_PROVIDER_SITE_OTHER)
Admission: RE | Admit: 2018-08-07 | Discharge: 2018-08-07 | Disposition: A | Discharge status: Home / Self Care | Payer: Medicare Other | Source: Ambulatory Visit | Attending: Cardiovascular Disease | Admitting: Cardiovascular Disease

## 2018-08-07 DIAGNOSIS — I712 Thoracic aortic aneurysm, without rupture, unspecified: Secondary | ICD-10-CM

## 2018-08-07 MED ORDER — IOPAMIDOL (ISOVUE-370) INJECTION 76%
100.0000 mL | Freq: Once | INTRAVENOUS | Status: AC | PRN
  Administered 2018-08-07: 100 mL via INTRAVENOUS

## 2018-10-20 DIAGNOSIS — K819 Cholecystitis, unspecified: Secondary | ICD-10-CM

## 2018-10-20 HISTORY — DX: Cholecystitis, unspecified: K81.9

## 2018-10-30 ENCOUNTER — Emergency Department (HOSPITAL_COMMUNITY): Payer: Medicare Other

## 2018-10-30 ENCOUNTER — Telehealth: Payer: Self-pay | Admitting: Cardiovascular Disease

## 2018-10-30 ENCOUNTER — Encounter: Payer: Self-pay | Admitting: Cardiovascular Disease

## 2018-10-30 ENCOUNTER — Inpatient Hospital Stay (HOSPITAL_COMMUNITY)
Admission: EM | Admit: 2018-10-30 | Discharge: 2018-11-02 | DRG: 418 | Disposition: A | Discharge status: Home / Self Care | Payer: Medicare Other | Attending: Family Medicine | Admitting: Family Medicine

## 2018-10-30 ENCOUNTER — Other Ambulatory Visit: Payer: Self-pay

## 2018-10-30 DIAGNOSIS — I4891 Unspecified atrial fibrillation: Secondary | ICD-10-CM | POA: Diagnosis not present

## 2018-10-30 DIAGNOSIS — Z79899 Other long term (current) drug therapy: Secondary | ICD-10-CM | POA: Diagnosis not present

## 2018-10-30 DIAGNOSIS — K760 Fatty (change of) liver, not elsewhere classified: Secondary | ICD-10-CM | POA: Diagnosis present

## 2018-10-30 DIAGNOSIS — K810 Acute cholecystitis: Secondary | ICD-10-CM | POA: Diagnosis present

## 2018-10-30 DIAGNOSIS — Z8249 Family history of ischemic heart disease and other diseases of the circulatory system: Secondary | ICD-10-CM | POA: Diagnosis not present

## 2018-10-30 DIAGNOSIS — R1010 Upper abdominal pain, unspecified: Secondary | ICD-10-CM

## 2018-10-30 DIAGNOSIS — E1142 Type 2 diabetes mellitus with diabetic polyneuropathy: Secondary | ICD-10-CM | POA: Diagnosis present

## 2018-10-30 DIAGNOSIS — Z91010 Allergy to peanuts: Secondary | ICD-10-CM

## 2018-10-30 DIAGNOSIS — Z79890 Hormone replacement therapy (postmenopausal): Secondary | ICD-10-CM | POA: Diagnosis not present

## 2018-10-30 DIAGNOSIS — Z85828 Personal history of other malignant neoplasm of skin: Secondary | ICD-10-CM | POA: Diagnosis not present

## 2018-10-30 DIAGNOSIS — R7989 Other specified abnormal findings of blood chemistry: Secondary | ICD-10-CM

## 2018-10-30 DIAGNOSIS — N179 Acute kidney failure, unspecified: Secondary | ICD-10-CM | POA: Diagnosis present

## 2018-10-30 DIAGNOSIS — Z01810 Encounter for preprocedural cardiovascular examination: Secondary | ICD-10-CM | POA: Diagnosis not present

## 2018-10-30 DIAGNOSIS — M109 Gout, unspecified: Secondary | ICD-10-CM | POA: Diagnosis present

## 2018-10-30 DIAGNOSIS — I712 Thoracic aortic aneurysm, without rupture: Secondary | ICD-10-CM | POA: Diagnosis present

## 2018-10-30 DIAGNOSIS — Z7901 Long term (current) use of anticoagulants: Secondary | ICD-10-CM

## 2018-10-30 DIAGNOSIS — Z87891 Personal history of nicotine dependence: Secondary | ICD-10-CM

## 2018-10-30 DIAGNOSIS — I10 Essential (primary) hypertension: Secondary | ICD-10-CM

## 2018-10-30 DIAGNOSIS — K821 Hydrops of gallbladder: Secondary | ICD-10-CM | POA: Diagnosis present

## 2018-10-30 DIAGNOSIS — I119 Hypertensive heart disease without heart failure: Secondary | ICD-10-CM | POA: Diagnosis present

## 2018-10-30 DIAGNOSIS — G4733 Obstructive sleep apnea (adult) (pediatric): Secondary | ICD-10-CM | POA: Diagnosis present

## 2018-10-30 DIAGNOSIS — I4811 Longstanding persistent atrial fibrillation: Secondary | ICD-10-CM | POA: Diagnosis not present

## 2018-10-30 DIAGNOSIS — N200 Calculus of kidney: Secondary | ICD-10-CM | POA: Diagnosis present

## 2018-10-30 DIAGNOSIS — Z91012 Allergy to eggs: Secondary | ICD-10-CM

## 2018-10-30 DIAGNOSIS — I4821 Permanent atrial fibrillation: Secondary | ICD-10-CM | POA: Diagnosis present

## 2018-10-30 DIAGNOSIS — Z87442 Personal history of urinary calculi: Secondary | ICD-10-CM | POA: Diagnosis not present

## 2018-10-30 DIAGNOSIS — I2510 Atherosclerotic heart disease of native coronary artery without angina pectoris: Secondary | ICD-10-CM | POA: Diagnosis present

## 2018-10-30 DIAGNOSIS — H9190 Unspecified hearing loss, unspecified ear: Secondary | ICD-10-CM | POA: Diagnosis present

## 2018-10-30 DIAGNOSIS — R945 Abnormal results of liver function studies: Secondary | ICD-10-CM

## 2018-10-30 DIAGNOSIS — E785 Hyperlipidemia, unspecified: Secondary | ICD-10-CM | POA: Diagnosis present

## 2018-10-30 DIAGNOSIS — K819 Cholecystitis, unspecified: Secondary | ICD-10-CM

## 2018-10-30 DIAGNOSIS — E876 Hypokalemia: Secondary | ICD-10-CM | POA: Diagnosis present

## 2018-10-30 DIAGNOSIS — K8010 Calculus of gallbladder with chronic cholecystitis without obstruction: Secondary | ICD-10-CM | POA: Diagnosis present

## 2018-10-30 DIAGNOSIS — Z6841 Body Mass Index (BMI) 40.0 and over, adult: Secondary | ICD-10-CM

## 2018-10-30 HISTORY — DX: Cholecystitis, unspecified: K81.9

## 2018-10-30 LAB — CBC WITH DIFFERENTIAL/PLATELET
Abs Immature Granulocytes: 0.01 10*3/uL (ref 0.00–0.07)
BASOS ABS: 0 10*3/uL (ref 0.0–0.1)
BASOS PCT: 0 %
EOS ABS: 0 10*3/uL (ref 0.0–0.5)
EOS PCT: 1 %
HCT: 47 % (ref 39.0–52.0)
Hemoglobin: 15 g/dL (ref 13.0–17.0)
IMMATURE GRANULOCYTES: 0 %
Lymphocytes Relative: 16 %
Lymphs Abs: 0.9 10*3/uL (ref 0.7–4.0)
MCH: 29.7 pg (ref 26.0–34.0)
MCHC: 31.9 g/dL (ref 30.0–36.0)
MCV: 93.1 fL (ref 80.0–100.0)
Monocytes Absolute: 0.3 10*3/uL (ref 0.1–1.0)
Monocytes Relative: 5 %
NEUTROS PCT: 78 %
Neutro Abs: 4.1 10*3/uL (ref 1.7–7.7)
PLATELETS: 172 10*3/uL (ref 150–400)
RBC: 5.05 MIL/uL (ref 4.22–5.81)
RDW: 13.2 % (ref 11.5–15.5)
WBC: 5.2 10*3/uL (ref 4.0–10.5)
nRBC: 0 % (ref 0.0–0.2)

## 2018-10-30 LAB — COMPREHENSIVE METABOLIC PANEL
ALT: 275 U/L — ABNORMAL HIGH (ref 0–44)
ANION GAP: 10 (ref 5–15)
AST: 401 U/L — AB (ref 15–41)
Albumin: 3.7 g/dL (ref 3.5–5.0)
Alkaline Phosphatase: 130 U/L — ABNORMAL HIGH (ref 38–126)
BUN: 15 mg/dL (ref 8–23)
CHLORIDE: 101 mmol/L (ref 98–111)
CO2: 26 mmol/L (ref 22–32)
Calcium: 8.6 mg/dL — ABNORMAL LOW (ref 8.9–10.3)
Creatinine, Ser: 0.87 mg/dL (ref 0.61–1.24)
Glucose, Bld: 146 mg/dL — ABNORMAL HIGH (ref 70–99)
POTASSIUM: 3 mmol/L — AB (ref 3.5–5.1)
Sodium: 137 mmol/L (ref 135–145)
TOTAL PROTEIN: 6.7 g/dL (ref 6.5–8.1)
Total Bilirubin: 1.7 mg/dL — ABNORMAL HIGH (ref 0.3–1.2)

## 2018-10-30 LAB — URINALYSIS, ROUTINE W REFLEX MICROSCOPIC
BACTERIA UA: NONE SEEN
Bilirubin Urine: NEGATIVE
GLUCOSE, UA: 50 mg/dL — AB
KETONES UR: NEGATIVE mg/dL
LEUKOCYTES UA: NEGATIVE
NITRITE: NEGATIVE
PH: 7 (ref 5.0–8.0)
Protein, ur: NEGATIVE mg/dL
Specific Gravity, Urine: 1.013 (ref 1.005–1.030)

## 2018-10-30 LAB — GLUCOSE, CAPILLARY
Glucose-Capillary: 138 mg/dL — ABNORMAL HIGH (ref 70–99)
Glucose-Capillary: 373 mg/dL — ABNORMAL HIGH (ref 70–99)

## 2018-10-30 LAB — LIPASE, BLOOD: LIPASE: 27 U/L (ref 11–51)

## 2018-10-30 LAB — TROPONIN I

## 2018-10-30 MED ORDER — ONDANSETRON HCL 4 MG/2ML IJ SOLN: 4.0000 mg | Freq: Four times a day (QID) | INTRAMUSCULAR | Status: DC | PRN

## 2018-10-30 MED ORDER — DILTIAZEM HCL ER COATED BEADS 180 MG PO CP24
180.0000 mg | ORAL_CAPSULE | Freq: Every day | ORAL | Status: DC
  Administered 2018-10-30 – 2018-11-02 (×4): 180 mg via ORAL
  Filled 2018-10-30 (×4): qty 1

## 2018-10-30 MED ORDER — ONDANSETRON HCL 4 MG PO TABS: 4.0000 mg | ORAL_TABLET | Freq: Four times a day (QID) | ORAL | Status: DC | PRN

## 2018-10-30 MED ORDER — ALLOPURINOL 300 MG PO TABS
300.0000 mg | ORAL_TABLET | Freq: Every day | ORAL | Status: DC
  Administered 2018-10-30 – 2018-11-01 (×3): 300 mg via ORAL
  Filled 2018-10-30 (×3): qty 1

## 2018-10-30 MED ORDER — SODIUM CHLORIDE 0.9 % IV BOLUS
1000.0000 mL | Freq: Once | INTRAVENOUS | Status: AC
  Administered 2018-10-30: 1000 mL via INTRAVENOUS

## 2018-10-30 MED ORDER — INSULIN ASPART 100 UNIT/ML ~~LOC~~ SOLN
0.0000 [IU] | Freq: Three times a day (TID) | SUBCUTANEOUS | Status: DC
  Administered 2018-10-30 – 2018-11-01 (×3): 1 [IU] via SUBCUTANEOUS
  Administered 2018-11-02: 2 [IU] via SUBCUTANEOUS
  Administered 2018-11-02: 1 [IU] via SUBCUTANEOUS

## 2018-10-30 MED ORDER — INSULIN ASPART 100 UNIT/ML ~~LOC~~ SOLN
0.0000 [IU] | Freq: Every day | SUBCUTANEOUS | Status: DC
  Administered 2018-10-30: 5 [IU] via SUBCUTANEOUS
  Administered 2018-11-01: 2 [IU] via SUBCUTANEOUS

## 2018-10-30 MED ORDER — LISINOPRIL 20 MG PO TABS
20.0000 mg | ORAL_TABLET | Freq: Every day | ORAL | Status: DC
  Administered 2018-10-30: 20 mg via ORAL
  Filled 2018-10-30 (×2): qty 1

## 2018-10-30 MED ORDER — DEXTROSE-NACL 5-0.45 % IV SOLN
INTRAVENOUS | Status: DC
  Administered 2018-10-30: 15:00:00 via INTRAVENOUS

## 2018-10-30 MED ORDER — GUAIFENESIN-DM 100-10 MG/5ML PO SYRP
5.0000 mL | ORAL_SOLUTION | ORAL | Status: DC | PRN
  Administered 2018-10-30: 5 mL via ORAL
  Filled 2018-10-30: qty 5

## 2018-10-30 MED ORDER — ATENOLOL 50 MG PO TABS
50.0000 mg | ORAL_TABLET | Freq: Every day | ORAL | Status: DC
  Administered 2018-10-30 – 2018-11-02 (×4): 50 mg via ORAL
  Filled 2018-10-30 (×4): qty 1

## 2018-10-30 MED ORDER — PIPERACILLIN-TAZOBACTAM 3.375 G IVPB
3.3750 g | Freq: Three times a day (TID) | INTRAVENOUS | Status: DC
  Administered 2018-10-30 – 2018-11-01 (×6): 3.375 g via INTRAVENOUS
  Filled 2018-10-30 (×8): qty 50

## 2018-10-30 MED ORDER — METOPROLOL TARTRATE 5 MG/5ML IV SOLN
2.5000 mg | Freq: Four times a day (QID) | INTRAVENOUS | Status: DC | PRN
  Administered 2018-10-30: 2.5 mg via INTRAVENOUS
  Filled 2018-10-30: qty 5

## 2018-10-30 MED ORDER — SODIUM CHLORIDE 0.9 % IV SOLN
2.0000 g | Freq: Once | INTRAVENOUS | Status: AC
  Administered 2018-10-30: 2 g via INTRAVENOUS
  Filled 2018-10-30: qty 20

## 2018-10-30 MED ORDER — ATORVASTATIN CALCIUM 10 MG PO TABS
10.0000 mg | ORAL_TABLET | Freq: Every day | ORAL | Status: DC
  Administered 2018-10-30: 10 mg via ORAL
  Filled 2018-10-30: qty 1

## 2018-10-30 NOTE — Consult Note (Signed)
Reason for Consult: symptomatic cholelithiasis/cholecystitis Referring Physician:  Prinston Kynard is an 77 y.o. male.  HPI: 77 y/o male with hx of AF on chronic anticoagulation with Xarelto.  Presents to ED with upper right sided pain, has had one episode 3 weeks ago.  This episode got better after a short time.  It also occurred after eating lunch for the last episode. Today he developed dry heaves and went to Urgent Care, he was sent to the ED.   Work-up in the ED shows potassium of 3, glucose of 146, lipase of 27, AST of 401, ALT 275.  Total bilirubin 1.7.  Troponins less than 0.03 WBC 5.2, hemoglobin 15, hematocrit 47, platelets 142,000.  Imaging: Chest x-ray shows cardiomegaly with no active disease. Abdominal ultrasound: Cholelithiasis the largest stone being 2.5 cm associated with sludge.  Gallbladder wall thickening 8.2 mm, negative sonographic Murphy sign.  Common bile duct was 3.1 cm.  Patient also has hepatic steatosis.  We are asked to see.  Blood pressure 171/92.,  With a new temperature to 100.4, heart rate ranging from 80-102.    Past Medical History:  Diagnosis Date  . Atrial fibrillation ([place])    2008  . Cancer ([place])    skin  . Gout   . HTN (hypertension)   . Hyperlipidemia   . Nephrolithiasis   . Peripheral neuropathy   . Sleep apnea     Past Surgical History:  Procedure Laterality Date  . ARM SKIN LESION BIOPSY / EXCISION Right 8 15  . HERNIA REPAIR  1974  . SKIN CANCER EXCISION  2008, 07/2014  . TONSILECTOMY/ADENOIDECTOMY WITH MYRINGOTOMY  1950  . WISDOM TOOTH EXTRACTION      Family History  Problem Relation Age of Onset  . Heart attack Father   . Heart attack Mother   . Colon cancer Neg Hx   . Esophageal cancer Neg Hx   . Rectal cancer Neg Hx   . Stomach cancer Neg Hx     Social History:  reports that he quit smoking about 44 years ago. He has never used smokeless tobacco. He reports that he drinks about 14.0 standard drinks of alcohol per  week. He reports that he does not use drugs.  Allergies:  Allergies  Allergen Reactions  . Peanuts [Peanut Oil] Anaphylaxis  . Eggs Or Egg-Derived Products Hives, Itching and Swelling    Prior to Admission medications   Medication Sig Start Date End Date Taking? Authorizing Provider  allopurinol (ZYLOPRIM) 300 MG tablet Take 300 mg by mouth daily.    [provider]  atenolol (TENORMIN) 25 MG tablet Take 25 mg by mouth daily.    [provider]  atorvastatin (LIPITOR) 10 MG tablet Take 10 mg by mouth daily.    [provider]  co-enzyme Q-10 50 MG capsule Take 50 mg by mouth daily.    [provider]  colchicine 0.6 MG tablet Take 0.6 mg by mouth daily as needed (gout flare).     [provider]  diltiazem (CARDIZEM CD) 180 MG 24 hr capsule Take 180 mg by mouth daily.    [provider]  lisinopril (PRINIVIL,ZESTRIL) 20 MG tablet Take 20 mg by mouth daily.    [provider]  Melatonin 3 MG CAPS Take 3 mg by mouth as directed.    [provider]  Misc Natural Products (OSTEO BI-FLEX JOINT SHIELD PO) Take 2 tablets by mouth daily.    [provider]  Multiple  Vitamin (MULTIVITAMIN) tablet Take 1 tablet by mouth daily.    [provider]  Omega-3 Fatty Acids (FISH OIL) 1000 MG CAPS Take 1 capsule by mouth daily.    [provider]  rivaroxaban (XARELTO) 20 MG TABS tablet TAKE ONE TABLET (20 MG) BY MOUTH DAILY WITH SUPPER. 11/17/17   Burnell Blanks, MD  valsartan-hydrochlorothiazide (DIOVAN-HCT) 320-12.5 MG per tablet Take 1 tablet by mouth daily.    [provider]  vitamin C (ASCORBIC ACID) 500 MG tablet Take 500 mg by mouth daily.    [provider]  vitamin E 1000 UNIT capsule Take 1,000 Units by mouth daily.    [provider]  XARELTO 20 MG TABS tablet TAKE ONE TABLET BY MOUTH DAILY WITH SUPPER 11/21/17   Burnell Blanks, MD     Results for  orders placed or performed during the hospital encounter of 10/30/18 (from the past 48 hour(s))  Comprehensive metabolic panel     Status: Abnormal   Collection Time: 10/30/18 10:24 AM  Result Value Ref Range   Sodium 137 135 - 145 mmol/L   Potassium 3.0 (L) 3.5 - 5.1 mmol/L   Chloride 101 98 - 111 mmol/L   CO2 26 22 - 32 mmol/L   Glucose, Bld 146 (H) 70 - 99 mg/dL   BUN 15 8 - 23 mg/dL   Creatinine, Ser 0.87 0.61 - 1.24 mg/dL   Calcium 8.6 (L) 8.9 - 10.3 mg/dL   Total Protein 6.7 6.5 - 8.1 g/dL   Albumin 3.7 3.5 - 5.0 g/dL   AST 401 (H) 15 - 41 U/L   ALT 275 (H) 0 - 44 U/L   Alkaline Phosphatase 130 (H) 38 - 126 U/L   Total Bilirubin 1.7 (H) 0.3 - 1.2 mg/dL   GFR calc non Af Amer >60 >60 mL/min   GFR calc Af Amer >60 >60 mL/min    Comment: (NOTE) The eGFR has been calculated using the CKD EPI equation. This calculation has not been validated in all clinical situations. eGFR's persistently <60 mL/min signify possible Chronic Kidney Disease.    Anion gap 10 5 - 15    Comment: Performed at [facility] [address], [place], [place] [address]  Lipase, blood     Status: None   Collection Time: 10/30/18 10:24 AM  Result Value Ref Range   Lipase 27 11 - 51 U/L    Comment: Performed at [facility] [address], [place], [place] [address]  Troponin I Once     Status: None   Collection Time: 10/30/18 10:24 AM  Result Value Ref Range   Troponin I <0.03 <0.03 ng/mL    Comment: Performed at [facility] [address] [place], [place] [address]  CBC with Differential     Status: None   Collection Time: 10/30/18 10:24 AM  Result Value Ref Range   WBC 5.2 4.0 - 10.5 K/uL   RBC 5.05 4.22 - 5.81 MIL/uL   Hemoglobin 15.0 13.0 - 17.0 g/dL   HCT 47.0 39.0 - 52.0 %   MCV 93.1 80.0 - 100.0 fL   MCH 29.7 26.0 - 34.0 pg   MCHC 31.9 30.0 - 36.0 g/dL   RDW 13.2 11.5 - 15.5 %   Platelets 172 150 - 400 K/uL   nRBC 0.0 0.0 - 0.2 %   Neutrophils Relative % 78 %    Neutro Abs 4.1 1.7 - 7.7 K/uL   Lymphocytes Relative  16 %   Lymphs Abs 0.9 0.7 - 4.0 K/uL   Monocytes Relative 5 %   Monocytes Absolute 0.3 0.1 - 1.0 K/uL   Eosinophils Relative 1 %   Eosinophils Absolute 0.0 0.0 - 0.5 K/uL   Basophils Relative 0 %   Basophils Absolute 0.0 0.0 - 0.1 K/uL   Immature Granulocytes 0 %   Abs Immature Granulocytes 0.01 0.00 - 0.07 K/uL    Comment: Performed at [facility], [place] [address], [place], [place] [address]  Urinalysis, Routine w reflex microscopic     Status: Abnormal   Collection Time: 10/30/18 11:00 AM  Result Value Ref Range   Color, Urine YELLOW YELLOW   APPearance CLEAR CLEAR   Specific Gravity, Urine 1.013 1.005 - 1.030   pH 7.0 5.0 - 8.0   Glucose, UA 50 (A) NEGATIVE mg/dL   Hgb urine dipstick SMALL (A) NEGATIVE   Bilirubin Urine NEGATIVE NEGATIVE   Ketones, ur NEGATIVE NEGATIVE mg/dL   Protein, ur NEGATIVE NEGATIVE mg/dL   Nitrite NEGATIVE NEGATIVE   Leukocytes, UA NEGATIVE NEGATIVE   RBC / HPF 6-10 0 - 5 RBC/hpf   WBC, UA 0-5 0 - 5 WBC/hpf   Bacteria, UA NONE SEEN NONE SEEN   Mucus PRESENT     Comment: Performed at [facility] [address] [place], [place] [address]    Dg Chest 2 View  Result Date: 10/30/2018 CLINICAL DATA:  Upper abdominal pain EXAM: CHEST - 2 VIEW COMPARISON:  None. FINDINGS: Cardiomegaly. No confluent airspace opacities, effusions or edema. No acute bony abnormality. IMPRESSION: Cardiomegaly.  No active disease. Electronically Signed   By: Rolm Baptise M.D.   On: 10/30/2018 11:37   US Abdomen Limited Ruq  Result Date: 10/30/2018 CLINICAL DATA:  Upper abdominal pain EXAM: ULTRASOUND ABDOMEN LIMITED RIGHT UPPER QUADRANT COMPARISON:  CT abdomen 10/11/2018 FINDINGS: Gallbladder: Cholelithiasis. Largest gallstone 2.5 cm. Associated sludge. Gallbladder wall thickening 8.2 mm. Negative sonographic Murphy sign Common bile duct: Diameter: 3.1 mm Liver: Heterogeneous increased echogenicity liver  without focal lesion. Portal vein is patent on color Doppler imaging with normal direction of blood flow towards the liver. IMPRESSION: Cholelithiasis with gallbladder wall thickening suggestive of cholecystitis. However, no acute pain over the gallbladder. Hepatic steatosis Electronically Signed   By: Franchot Gallo M.D.   On: 10/30/2018 11:36    Review of Systems  Constitutional: Negative.   HENT: Positive for hearing loss.   Eyes: Negative.   Respiratory: Positive for cough and wheezing. Negative for hemoptysis, sputum production and shortness of breath.   Cardiovascular: Positive for palpitations (HR currently up in the 120-140 range) and leg swelling (normally he says he does not have lower leg edema, but he has it today.). Negative for orthopnea.  Gastrointestinal: Positive for abdominal pain (mid abdomen, now in RUQ) and nausea. Negative for blood in stool, constipation, diarrhea, heartburn, melena and vomiting.  Genitourinary: Positive for frequency (In ED with IV fluids) and hematuria (Schelduled for cystoscopy tomorrow by [facility], post poned).       Hx of hematuria and kidney stones  Musculoskeletal: Negative.   Skin: Negative.   Neurological: Negative.   Endo/Heme/Allergies: Positive for environmental allergies (eggs). Negative for polydipsia. Bruises/bleeds easily.  Psychiatric/Behavioral: Negative.    Blood pressure (!) 145/78, pulse (!) 102, temperature 97.8 F (36.6 C), temperature source Oral, resp. rate (!) 23, height _0  (1.727 m), weight 122.5 kg, SpO2 97 %. Physical Exam  Constitutional: He is oriented to  person, place, and time. He appears well-developed and well-nourished. No distress.  BMI 41  HENT:  Mouth/Throat: Oropharynx is clear and moist.  Eyes: Right eye exhibits no discharge. Left eye exhibits no discharge. No scleral icterus.  Pupils are equal  Neck: Normal range of motion. Neck supple. No JVD present. No tracheal deviation present. No thyromegaly  present.  Cardiovascular: Normal heart sounds and intact distal pulses.  No murmur heard. HR 120 -140 range, irregular, alarm going off every couple minutes  Respiratory: Effort normal. No respiratory distress. He has wheezes. He has no rales. He exhibits no tenderness.  GI: He exhibits distension.  Abdomen is distended, large abdomen.  Tender RUQ, he says pain started mid abdomen but has moved to RUQ  Musculoskeletal: He exhibits edema (trace - +1 edema both lower legs). He exhibits no tenderness.  Lymphadenopathy:    He has no cervical adenopathy.  Neurological: He is alert and oriented to person, place, and time. No cranial nerve deficit.  Skin: Skin is warm and dry. No rash noted. He is not diaphoretic. No erythema. No pallor.  Psychiatric: He has a normal mood and affect. His behavior is normal. Judgment and thought content normal.    Assessment/Plan: Cholecystitis/symptomatic cholelithiasis AF - on chronic anitcoagulation - last Dose Xarelto PM 10/26/18 in PM Recent URI/Bronchitis  Gout Hypertension Nephrolithiasis - Hematuria - scheduled for Cystoscopy [facility] Hyperlipidemia OSA - CPAP Obesity - BMI 41.5  Plan:  Medicine admit, Medical stabilization, Cardiology consult.  I would give him a couple days for the Xarelto to clear.  If all is well we can aim to to on Wednesday.  Start antibiotics and they are placing on on Zosyn.      Alexcis Bicking 10/30/2018, 12:49 PM

## 2018-10-30 NOTE — ED Notes (Signed)
Patient transported to X-ray 

## 2018-10-30 NOTE — ED Triage Notes (Signed)
Woke up 0500 w/ upper abd pain, increased tenderness R side; pt had a similar occurrence 3-4 weeks ago that went away, however he has had nor elief today; while having a bowel movement today pt began to feel nauseated, had dry heaves; took 1 gas-x, 2 Pepto bismol; pt went to urgent care, was referred to to [facility] for further evaluation

## 2018-10-30 NOTE — ED Notes (Signed)
Pt ambulatory to restroom w/ steady gait 

## 2018-10-30 NOTE — H&P (Signed)
[facility]                                                                                    Patient Demographics  Tyler Sparks, is a 77 y.o. male  CSN: [id number]  MRN: [id number]  DOB - 21-Mar-1941  Admit Date - 10/30/2018  Outpatient Primary MD for the patient is Velna Hatchet, MD   With History of -  Past Medical History:  Diagnosis Date  . Atrial fibrillation ([place])    2008  . Cancer ([place])    skin  . Gout   . HTN (hypertension)   . Hyperlipidemia   . Nephrolithiasis   . Peripheral neuropathy   . Sleep apnea       Past Surgical History:  Procedure Laterality Date  . ARM SKIN LESION BIOPSY / EXCISION Right 8 15  . HERNIA REPAIR  1974  . SKIN CANCER EXCISION  2008, 07/2014  . TONSILECTOMY/ADENOIDECTOMY WITH MYRINGOTOMY  1950  . WISDOM TOOTH EXTRACTION      in for   Chief Complaint  Patient presents with  . Abdominal Pain     HPI  Tyler Sparks  is a 77 y.o. male, with past medical history significant for A. fib on Xarelto, diabetes mellitus and hypertension presenting today with 1 day history of right upper quadrant pain associated with nausea but no vomiting.  Patient did not take anything p.o.  Patient reports fever and chills no diarrhea, no radiation of pain.  Work-up in the emergency room revealed cholecystitis by ultrasound.  Surgery was consulted and we are admitting the patient and holding Xarelto.  Patient started on IV antibiotics    Review of Systems    In addition to the HPI above,   No Headache, No changes with Vision or hearing, No problems swallowing food or Liquids, No Chest pain, Cough or Shortness of Breath,  Bowel movements are regular, No Blood in stool or Urine, No dysuria, No new skin rashes or bruises, No new joints pains-aches,  No new weakness, tingling, numbness in any extremity, No recent weight gain or loss, No polyuria, polydypsia or polyphagia, No significant Mental Stressors.  A full 10 point  Review of Systems was done, except as stated above, all other Review of Systems were negative.   Social History Social History   Tobacco Use  . Smoking status: Former Smoker    Last attempt to quit: 12/26/1973    Years since quitting: 44.8  . Smokeless tobacco: Never Used  Substance Use Topics  . Alcohol use: Yes    Alcohol/week: 14.0 standard drinks    Types: 14 Standard drinks or equivalent per week    Comment: stopped for about 6 weeks, researched and resumed      Family History Family History  Problem Relation Age of Onset  . Heart attack Father   . Heart attack Mother   . Colon cancer Neg Hx   . Esophageal cancer Neg Hx   . Rectal cancer Neg Hx   . Stomach cancer Neg Hx      Prior to Admission medications   Medication Sig Start Date End Date Taking? Authorizing Provider  allopurinol (  ZYLOPRIM) 300 MG tablet Take 300 mg by mouth daily.    [provider]  atenolol (TENORMIN) 25 MG tablet Take 25 mg by mouth daily.    [provider]  atorvastatin (LIPITOR) 10 MG tablet Take 10 mg by mouth daily.    [provider]  co-enzyme Q-10 50 MG capsule Take 50 mg by mouth daily.    [provider]  colchicine 0.6 MG tablet Take 0.6 mg by mouth daily as needed (gout flare).     [provider]  diltiazem (CARDIZEM CD) 180 MG 24 hr capsule Take 180 mg by mouth daily.    [provider]  lisinopril (PRINIVIL,ZESTRIL) 20 MG tablet Take 20 mg by mouth daily.    [provider]  Melatonin 3 MG CAPS Take 3 mg by mouth as directed.    [provider]  Misc Natural Products (OSTEO BI-FLEX JOINT SHIELD PO) Take 2 tablets by mouth daily.    [provider]  Multiple Vitamin (MULTIVITAMIN) tablet Take 1 tablet by mouth daily.    [provider]  Omega-3 Fatty Acids (FISH OIL) 1000 MG CAPS Take 1 capsule by mouth daily.    [provider]  rivaroxaban (XARELTO) 20 MG TABS tablet TAKE ONE TABLET  (20 MG) BY MOUTH DAILY WITH SUPPER. 11/17/17   Burnell Blanks, MD  valsartan-hydrochlorothiazide (DIOVAN-HCT) 320-12.5 MG per tablet Take 1 tablet by mouth daily.    [provider]  vitamin C (ASCORBIC ACID) 500 MG tablet Take 500 mg by mouth daily.    [provider]  vitamin E 1000 UNIT capsule Take 1,000 Units by mouth daily.    [provider]  XARELTO 20 MG TABS tablet TAKE ONE TABLET BY MOUTH DAILY WITH SUPPER 11/21/17   Burnell Blanks, MD    Allergies  Allergen Reactions  . Peanuts [Peanut Oil] Anaphylaxis  . Eggs Or Egg-Derived Products Hives, Itching and Swelling    Physical Exam  Vitals  Blood pressure (!) 145/78, pulse (!) 102, temperature (S) (!) 100.4 F (38 C), resp. rate (!) 23, height 5\' 8"  (1.727 m), weight 122.5 kg, SpO2 97 %.   1. General well-developed, well-nourished male in moderate pain  2. Normal affect and insight, Not Suicidal or Homicidal, Awake Alert, Oriented X 3.  3. No F.N deficits, grossly, patient moving all extremities.  4. Ears and Eyes appear Normal, Conjunctivae clear, PERRLA. Moist Oral Mucosa.  5. Supple Neck, No JVD, No cervical lymphadenopathy appriciated, No Carotid Bruits.  6. Symmetrical Chest wall movement, Good air movement bilaterally, CTAB.  7. RRR, No Gallops, Rubs or Murmurs, No Parasternal Heave.  8. Positive Bowel Sounds, Abdomen Soft, right upper  quadrant tenderness noted.  9.  No Cyanosis, Normal Skin Turgor, No Skin Rash or Bruise.  10. Good muscle tone,  joints appear normal , no effusions, Normal ROM.    Data Review  CBC Recent Labs  Lab 10/30/18 1024  WBC 5.2  HGB 15.0  HCT 47.0  PLT 172  MCV 93.1  MCH 29.7  MCHC 31.9  RDW 13.2  LYMPHSABS 0.9  MONOABS 0.3  EOSABS 0.0  BASOSABS 0.0   ------------------------------------------------------------------------------------------------------------------  Chemistries  Recent Labs  Lab 10/30/18 1024  NA 137   K 3.0*  CL 101  CO2 26  GLUCOSE 146*  BUN 15  CREATININE 0.87  CALCIUM 8.6*  AST 401*  ALT 275*  ALKPHOS 130*  BILITOT 1.7*   ------------------------------------------------------------------------------------------------------------------ estimated creatinine clearance is 92  mL/min (by C-G formula based on SCr of 0.87 mg/dL). ------------------------------------------------------------------------------------------------------------------ No results for input(s): TSH, T4TOTAL, T3FREE, THYROIDAB in the last 72 hours.  Invalid input(s): FREET3   Coagulation profile No results for input(s): INR, PROTIME in the last 168 hours. ------------------------------------------------------------------------------------------------------------------- No results for input(s): DDIMER in the last 72 hours. -------------------------------------------------------------------------------------------------------------------  Cardiac Enzymes Recent Labs  Lab 10/30/18 1024  TROPONINI <0.03   ------------------------------------------------------------------------------------------------------------------ Invalid input(s): POCBNP   ---------------------------------------------------------------------------------------------------------------  Urinalysis    Component Value Date/Time   COLORURINE YELLOW 10/30/2018 1100   APPEARANCEUR CLEAR 10/30/2018 1100   LABSPEC 1.013 10/30/2018 1100   PHURINE 7.0 10/30/2018 1100   GLUCOSEU 50 (A) 10/30/2018 1100   HGBUR SMALL (A) 10/30/2018 1100   BILIRUBINUR NEGATIVE 10/30/2018 1100   KETONESUR NEGATIVE 10/30/2018 1100   PROTEINUR NEGATIVE 10/30/2018 1100   NITRITE NEGATIVE 10/30/2018 1100   LEUKOCYTESUR NEGATIVE 10/30/2018 1100    ----------------------------------------------------------------------------------------------------------------   Imaging results:   Dg Chest 2 View  Result Date: 10/30/2018 CLINICAL DATA:  Upper abdominal  pain EXAM: CHEST - 2 VIEW COMPARISON:  None. FINDINGS: Cardiomegaly. No confluent airspace opacities, effusions or edema. No acute bony abnormality. IMPRESSION: Cardiomegaly.  No active disease. Electronically Signed   By: Rolm Baptise M.D.   On: 10/30/2018 11:37   US Abdomen Limited Ruq  Result Date: 10/30/2018 CLINICAL DATA:  Upper abdominal pain EXAM: ULTRASOUND ABDOMEN LIMITED RIGHT UPPER QUADRANT COMPARISON:  CT abdomen 10/11/2018 FINDINGS: Gallbladder: Cholelithiasis. Largest gallstone 2.5 cm. Associated sludge. Gallbladder wall thickening 8.2 mm. Negative sonographic Murphy sign Common bile duct: Diameter: 3.1 mm Liver: Heterogeneous increased echogenicity liver without focal lesion. Portal vein is patent on color Doppler imaging with normal direction of blood flow towards the liver. IMPRESSION: Cholelithiasis with gallbladder wall thickening suggestive of cholecystitis. However, no acute pain over the gallbladder. Hepatic steatosis Electronically Signed   By: Franchot Gallo M.D.   On: 10/30/2018 11:36    My personal review of EKG: A. fib at 95 bpm, no acute changes  Assessment & Plan  Cholecystitis Surgery on consult Xarelto on hold Continue with IV Zosyn Clear liquid diet/n.p.o. after midnight  A. fib; controlled rate Hold Xarelto Continue with Cardizem and Tenormin  Hypertension Continue with Cardizem, Tenormin and lisinopril we will hold Diovan at this time and monitor.  PRN Lopressor IV ordered.  Consider increasing Tenormin  History of gout Continue with allopurinol  Diabetes mellitus Insulin sliding scale  Hyperlipidemia continue with Lipitor  DVT Prophylaxis Lovenox  AM Labs Ordered, also please review Full Orders  Family Communication: Admission, patients condition and plan of care including tests being ordered have been discussed with the patient and wife who indicate understanding and agree with the plan and Code Status.  Code Status full  Disposition Plan:  Home  Time spent in minutes : 42 minutes  Condition GUARDED   @SIGNATURE @

## 2018-10-30 NOTE — ED Notes (Signed)
Got patient undress on the monitor did ekg shown to Dr Regenia Skeeter patient is resting with call bell in reach and doctor at bedside and family

## 2018-10-30 NOTE — ED Provider Notes (Signed)
[facility] EMERGENCY DEPARTMENT Provider Note   CSN: [id number] Arrival date & time: 10/30/18  1010     History   Chief Complaint Chief Complaint  Patient presents with  . Abdominal Pain    HPI Tyler Sparks is a 77 y.o. male.  HPI  77 year old male with a history of atrial fibrillation, hypertension and a known 4.2 cm thoracic aortic aneurysm presents with epigastric abdominal pain.  First noticed it a few weeks ago but only lasted 10 or 15 minutes and seem to go away.  He thought it might be gas.  Again came this morning waking him up around 5 AM.  It was more severe when he first woke up and now is about a 5 out of 10.  He has a hard time describing what the pain feels like but it feels like a deep pain.  There is no chest pain or shortness of breath.  Nothing he does makes it better or worse.  He took some Gas-X and Pepto-Bismol but does not think they are what made the pain a little bit better.  When he was having a bowel movement this morning he became very nauseated and started dry heaving but otherwise has had no nausea or vomiting.  A few weeks ago he had hematuria and ended up seeing the urologist.  They did a CT scan that showed some small kidney stones but was reportedly otherwise fine.  Hematuria has resolved and he has no urinary symptoms currently.  Past Medical History:  Diagnosis Date  . Atrial fibrillation ([place])    2008  . Cancer ([place])    skin  . Cholecystitis 10/2018  . Gout   . HTN (hypertension)   . Hyperlipidemia   . Nephrolithiasis   . Peripheral neuropathy   . Sleep apnea     Patient Active Problem List   Diagnosis Date Noted  . Cholecystitis 10/30/2018  . Peripheral neuropathy 07/22/2014  . Atrial fibrillation ([place]) 06/26/2014    Past Surgical History:  Procedure Laterality Date  . ARM SKIN LESION BIOPSY / EXCISION Right 8 15  . HERNIA REPAIR  1974  . SKIN CANCER EXCISION  2008, 07/2014  . TONSILECTOMY/ADENOIDECTOMY WITH  MYRINGOTOMY  1950  . WISDOM TOOTH EXTRACTION          Home Medications    Prior to Admission medications   Medication Sig Start Date End Date Taking? Authorizing Provider  allopurinol (ZYLOPRIM) 300 MG tablet Take 300 mg by mouth daily.    [provider]  atenolol (TENORMIN) 25 MG tablet Take 25 mg by mouth daily.    [provider]  atorvastatin (LIPITOR) 10 MG tablet Take 10 mg by mouth daily.    [provider]  co-enzyme Q-10 50 MG capsule Take 50 mg by mouth daily.    [provider]  colchicine 0.6 MG tablet Take 0.6 mg by mouth daily as needed (gout flare).     [provider]  diltiazem (CARDIZEM CD) 180 MG 24 hr capsule Take 180 mg by mouth daily.    [provider]  lisinopril (PRINIVIL,ZESTRIL) 20 MG tablet Take 20 mg by mouth daily.    [provider]  Melatonin 3 MG CAPS Take 3 mg by mouth as directed.    [provider]  Misc Natural Products (OSTEO BI-FLEX JOINT SHIELD PO) Take 2 tablets by mouth daily.    [provider]  Multiple Vitamin (MULTIVITAMIN) tablet Take 1 tablet by mouth daily.  [provider]  Omega-3 Fatty Acids (FISH OIL) 1000 MG CAPS Take 1 capsule by mouth daily.    [provider]  rivaroxaban (XARELTO) 20 MG TABS tablet TAKE ONE TABLET (20 MG) BY MOUTH DAILY WITH SUPPER. 11/17/17   Burnell Blanks, MD  valsartan-hydrochlorothiazide (DIOVAN-HCT) 320-12.5 MG per tablet Take 1 tablet by mouth daily.    [provider]  vitamin C (ASCORBIC ACID) 500 MG tablet Take 500 mg by mouth daily.    [provider]  vitamin E 1000 UNIT capsule Take 1,000 Units by mouth daily.    [provider]  XARELTO 20 MG TABS tablet TAKE ONE TABLET BY MOUTH DAILY WITH SUPPER 11/21/17   Burnell Blanks, MD    Family History Family History  Problem Relation Age of Onset  . Heart attack Father   . Heart attack Mother   . Colon cancer  Neg Hx   . Esophageal cancer Neg Hx   . Rectal cancer Neg Hx   . Stomach cancer Neg Hx     Social History Social History   Tobacco Use  . Smoking status: Former Smoker    Last attempt to quit: 12/26/1973    Years since quitting: 44.8  . Smokeless tobacco: Never Used  Substance Use Topics  . Alcohol use: Yes    Alcohol/week: 14.0 standard drinks    Types: 14 Standard drinks or equivalent per week    Comment: OCCASIONAL  . Drug use: No     Allergies   Peanuts [peanut oil] and Eggs or egg-derived products   Review of Systems Review of Systems  Constitutional: Negative for fever.  Respiratory: Negative for shortness of breath.   Cardiovascular: Negative for chest pain.  Gastrointestinal: Positive for abdominal pain and nausea. Negative for blood in stool and vomiting.  Musculoskeletal: Negative for back pain.  All other systems reviewed and are negative.    Physical Exam Updated Vital Signs BP (!) 175/77 (BP Location: Left Arm)   Pulse (!) 104   Temp 99.2 F (37.3 C) (Oral)   Resp 18   Ht 5\' 8"  (1.727 m)   Wt 122.5 kg   SpO2 95%   BMI 41.05 kg/m   Physical Exam  Constitutional: He appears well-developed and well-nourished. No distress.  obese  HENT:  Head: Normocephalic and atraumatic.  Right Ear: External ear normal.  Left Ear: External ear normal.  Nose: Nose normal.  Eyes: Right eye exhibits no discharge. Left eye exhibits no discharge.  Neck: Neck supple.  Cardiovascular: Normal rate, regular rhythm and normal heart sounds.  Pulmonary/Chest: Effort normal and breath sounds normal.  Abdominal: Soft. There is tenderness (mild) in the right upper quadrant.    Musculoskeletal: He exhibits no edema.  Neurological: He is alert.  Skin: Skin is warm and dry. He is not diaphoretic.  Psychiatric: His mood appears not anxious.  Nursing note and vitals reviewed.    ED Treatments / Results  Labs (all labs ordered are listed, but only abnormal results are  displayed) Labs Reviewed  COMPREHENSIVE METABOLIC PANEL - Abnormal; Notable for the following components:      Result Value   Potassium 3.0 (*)    Glucose, Bld 146 (*)    Calcium 8.6 (*)    AST 401 (*)    ALT 275 (*)    Alkaline Phosphatase 130 (*)    Total Bilirubin 1.7 (*)    All other components within normal limits  URINALYSIS, ROUTINE W REFLEX MICROSCOPIC -  Abnormal; Notable for the following components:   Glucose, UA 50 (*)    Hgb urine dipstick SMALL (*)    All other components within normal limits  LIPASE, BLOOD  TROPONIN I  CBC WITH DIFFERENTIAL/PLATELET    EKG EKG Interpretation  Date/Time:  Monday October 30 2018 10:19:15 EST Ventricular Rate:  95 PR Interval:    QRS Duration: 97 QT Interval:  283 QTC Calculation: 356 R Axis:   80 Text Interpretation:  Atrial fibrillation Low voltage, precordial leads Nonspecific repol abnormality, diffuse leads No old tracing to compare Confirmed by Sherwood Gambler [phone]) on 10/30/2018 10:35:07 AM   Radiology Dg Chest 2 View  Result Date: 10/30/2018 CLINICAL DATA:  Upper abdominal pain EXAM: CHEST - 2 VIEW COMPARISON:  None. FINDINGS: Cardiomegaly. No confluent airspace opacities, effusions or edema. No acute bony abnormality. IMPRESSION: Cardiomegaly.  No active disease. Electronically Signed   By: Rolm Baptise M.D.   On: 10/30/2018 11:37   US Abdomen Limited Ruq  Result Date: 10/30/2018 CLINICAL DATA:  Upper abdominal pain EXAM: ULTRASOUND ABDOMEN LIMITED RIGHT UPPER QUADRANT COMPARISON:  CT abdomen 10/11/2018 FINDINGS: Gallbladder: Cholelithiasis. Largest gallstone 2.5 cm. Associated sludge. Gallbladder wall thickening 8.2 mm. Negative sonographic Murphy sign Common bile duct: Diameter: 3.1 mm Liver: Heterogeneous increased echogenicity liver without focal lesion. Portal vein is patent on color Doppler imaging with normal direction of blood flow towards the liver. IMPRESSION: Cholelithiasis with gallbladder wall thickening  suggestive of cholecystitis. However, no acute pain over the gallbladder. Hepatic steatosis Electronically Signed   By: Franchot Gallo M.D.   On: 10/30/2018 11:36    Procedures Procedures (including critical care time)  Medications Ordered in ED Medications  allopurinol (ZYLOPRIM) tablet 300 mg (300 mg Oral Given 10/30/18 1508)  atenolol (TENORMIN) tablet 50 mg (50 mg Oral Given 10/30/18 1508)  atorvastatin (LIPITOR) tablet 10 mg (has no administration in time range)  diltiazem (CARDIZEM CD) 24 hr capsule 180 mg (180 mg Oral Given 10/30/18 1508)  lisinopril (PRINIVIL,ZESTRIL) tablet 20 mg (20 mg Oral Given 10/30/18 1508)  dextrose 5 %-0.45 % sodium chloride infusion ( Intravenous New Bag/Given 10/30/18 1512)  ondansetron (ZOFRAN) tablet 4 mg (has no administration in time range)    Or  ondansetron (ZOFRAN) injection 4 mg (has no administration in time range)  piperacillin-tazobactam (ZOSYN) IVPB 3.375 g (3.375 g Intravenous New Bag/Given 10/30/18 1513)  metoprolol tartrate (LOPRESSOR) injection 2.5 mg (2.5 mg Intravenous Given 10/30/18 1435)  insulin aspart (novoLOG) injection 0-9 Units (has no administration in time range)  insulin aspart (novoLOG) injection 0-5 Units (has no administration in time range)  sodium chloride 0.9 % bolus 1,000 mL (0 mLs Intravenous Stopped 10/30/18 1205)  cefTRIAXone (ROCEPHIN) 2 g in sodium chloride 0.9 % 100 mL IVPB (2 g Intravenous New Bag/Given 10/30/18 1308)     Initial Impression / Assessment and Plan / ED Course  I have reviewed the triage vital signs and the nursing notes.  Pertinent labs & imaging results that were available during my care of the patient were reviewed by me and considered in my medical decision making (see chart for details).     Ultrasound and labs are concerning for cholecystitis.  The patient declines pain meds but will need admission for IV antibiotics and cholecystectomy.  Surgery has been consulted and they request  hospitalist admit.  Final Clinical Impressions(s) / ED Diagnoses   Final diagnoses:  Upper abdominal pain  Cholecystitis    ED Discharge Orders    None  Sherwood Gambler, MD 10/30/18 [phone]

## 2018-10-30 NOTE — Telephone Encounter (Signed)
  Error. No note needed  

## 2018-10-31 ENCOUNTER — Inpatient Hospital Stay (HOSPITAL_COMMUNITY): Payer: Medicare Other

## 2018-10-31 DIAGNOSIS — I4811 Longstanding persistent atrial fibrillation: Secondary | ICD-10-CM

## 2018-10-31 DIAGNOSIS — Z01810 Encounter for preprocedural cardiovascular examination: Secondary | ICD-10-CM

## 2018-10-31 LAB — COMPREHENSIVE METABOLIC PANEL
ALBUMIN: 2.7 g/dL — AB (ref 3.5–5.0)
ALT: 1204 U/L — ABNORMAL HIGH (ref 0–44)
AST: 908 U/L — ABNORMAL HIGH (ref 15–41)
Alkaline Phosphatase: 132 U/L — ABNORMAL HIGH (ref 38–126)
Anion gap: 8 (ref 5–15)
BUN: 20 mg/dL (ref 8–23)
CHLORIDE: 98 mmol/L (ref 98–111)
CO2: 27 mmol/L (ref 22–32)
CREATININE: 1.67 mg/dL — AB (ref 0.61–1.24)
Calcium: 7.5 mg/dL — ABNORMAL LOW (ref 8.9–10.3)
GFR calc non Af Amer: 38 mL/min — ABNORMAL LOW (ref >60)
GFR, EST AFRICAN AMERICAN: 44 mL/min — AB (ref >60)
GLUCOSE: 298 mg/dL — AB (ref 70–99)
Potassium: 2.8 mmol/L — ABNORMAL LOW (ref 3.5–5.1)
SODIUM: 133 mmol/L — AB (ref 135–145)
Total Bilirubin: 4.6 mg/dL — ABNORMAL HIGH (ref 0.3–1.2)
Total Protein: 5.3 g/dL — ABNORMAL LOW (ref 6.5–8.1)

## 2018-10-31 LAB — CBC
HCT: 35.6 % — ABNORMAL LOW (ref 39.0–52.0)
Hemoglobin: 11.8 g/dL — ABNORMAL LOW (ref 13.0–17.0)
MCH: 31.1 pg (ref 26.0–34.0)
MCHC: 33.1 g/dL (ref 30.0–36.0)
MCV: 93.7 fL (ref 80.0–100.0)
NRBC: 0 % (ref 0.0–0.2)
PLATELETS: 126 10*3/uL — AB (ref 150–400)
RBC: 3.8 MIL/uL — ABNORMAL LOW (ref 4.22–5.81)
RDW: 13.6 % (ref 11.5–15.5)
WBC: 7.7 10*3/uL (ref 4.0–10.5)

## 2018-10-31 LAB — HEPARIN LEVEL (UNFRACTIONATED): Heparin Unfractionated: 0.68 IU/mL (ref 0.30–0.70)

## 2018-10-31 LAB — APTT: aPTT: 42 seconds — ABNORMAL HIGH (ref 24–36)

## 2018-10-31 LAB — GLUCOSE, CAPILLARY
GLUCOSE-CAPILLARY: 106 mg/dL — AB (ref 70–99)
Glucose-Capillary: 135 mg/dL — ABNORMAL HIGH (ref 70–99)
Glucose-Capillary: 115 mg/dL — ABNORMAL HIGH (ref 70–99)
Glucose-Capillary: 102 mg/dL — ABNORMAL HIGH (ref 70–99)

## 2018-10-31 MED ORDER — HEPARIN (PORCINE) 25000 UT/250ML-% IV SOLN
1200.0000 [IU]/h | INTRAVENOUS | Status: DC
  Administered 2018-10-31: 1200 [IU]/h via INTRAVENOUS
  Filled 2018-10-31: qty 250

## 2018-10-31 MED ORDER — KCL IN DEXTROSE-NACL 20-5-0.45 MEQ/L-%-% IV SOLN
INTRAVENOUS | Status: DC
  Administered 2018-10-31 – 2018-11-01 (×3): via INTRAVENOUS
  Filled 2018-10-31 (×3): qty 1000

## 2018-10-31 MED ORDER — POTASSIUM CHLORIDE CRYS ER 20 MEQ PO TBCR
40.0000 meq | EXTENDED_RELEASE_TABLET | ORAL | Status: AC
  Administered 2018-10-31 (×2): 40 meq via ORAL
  Filled 2018-10-31 (×2): qty 2

## 2018-10-31 MED ORDER — POTASSIUM CHLORIDE 2 MEQ/ML IV SOLN
INTRAVENOUS | Status: DC
  Filled 2018-10-31 (×2): qty 1000

## 2018-10-31 MED ORDER — GADOBUTROL 1 MMOL/ML IV SOLN
10.0000 mL | Freq: Once | INTRAVENOUS | Status: AC | PRN
  Administered 2018-10-31: 10 mL via INTRAVENOUS

## 2018-10-31 MED ORDER — POTASSIUM CHLORIDE 20 MEQ/15ML (10%) PO SOLN: 40.0000 meq | ORAL | Status: DC

## 2018-10-31 NOTE — Consult Note (Addendum)
Cardiology Consultation:   Patient ID: Kiev Labrosse MRN: [id number]; DOB: March 06, 1941  Admit date: 10/30/2018 Date of Consult: 10/31/2018  Primary Care Provider: Velna Hatchet, MD Primary Cardiologist: Lauree Chandler, MD  Primary Electrophysiologist:  None    Patient Profile:   Dianne Bady is a 77 y.o. male with a hx of paroxysmal atrial fibrillation on Xarelto, hypertension, hyperlipidemia, gout, OSA on CPAP, thoracic aortic aneurysm who is being seen today for preoperative cardiac evaluation at the request of Dr. Verlon Au.  History of Present Illness:   Mr. Becraft was last seen in our office on 11/17/2017 by Dr. Angelena Form.  He has a history of atrial fibrillation with RVR in 2008 and converted to sinus rhythm with IV diltiazem.  He had been maintaining sinus rhythm on Cardizem and atenolol.  When seen in 07/2015 he was in atrial fibrillation and anticoagulation was initiated.  Echocardiogram in 09/2015 showed normal LV systolic function and no valvular disease.  He has known mildly dilated aortic root at 4.2 cm by CTA in August 2018.  Repeat CTA in August 2019 showed stable ascending thoracic aortic aneurysm at 4.2 cm.  Mr. Jamar presented to the ED on 10/30/2018 with complaints of abdomen and right side pain with nausea.  LFTs are elevated and ultrasound showed cholelithiasis with gallbladder wall thickening suggestive of cholecystitis.  The patient has been seen by surgery.  He has been started on antibiotics and waiting for medical stabilization with possible surgery on Wednesday.  Upon my assessment Mr Vincent tells me that he is not as active as he used to be but he still walks 2-3 miles, 2-3 times per week without any exertional chest discomfort or shortness of breath.  He never has any awareness of his atrial fibrillation.  He reports that his home blood pressures have been in the 120/70 range and his heart rate has been well controlled, occasionally down into the 40s,  but he tolerates this well.  He says that he has been told in the past that he has borderline diabetes but has never been on medication.  His home medications for blood pressure include an ACE inhibitor and an ARB and the patient says this was questioned by Dr. Angelena Form at his last office visit.  He discussed his regimen with his primary care physician and his regimen was left the way it is as he has been on it for several years without any problems.  Mr Capelli does not have any history of ischemic events.  He had a stress test back when he was first diagnosed with atrial fibrillation which was normal.  He does not smoke.  He has an occasional 1-2 glasses of wine, about once a month.  He uses CPAP for sleep apnea and reports compliance.  Past Medical History:  Diagnosis Date  . Atrial fibrillation ([place])    2008  . Cancer ([place])    skin  . Cholecystitis 10/2018  . Gout   . HTN (hypertension)   . Hyperlipidemia   . Nephrolithiasis   . Peripheral neuropathy   . Sleep apnea     Past Surgical History:  Procedure Laterality Date  . ARM SKIN LESION BIOPSY / EXCISION Right 8 15  . HERNIA REPAIR  1974  . SKIN CANCER EXCISION  2008, 07/2014  . TONSILECTOMY/ADENOIDECTOMY WITH MYRINGOTOMY  1950  . WISDOM TOOTH EXTRACTION       Home Medications:  Prior to Admission medications   Medication Sig Start Date End Date Taking? Authorizing Provider  allopurinol (  ZYLOPRIM) 300 MG tablet Take 300 mg by mouth at bedtime.    Yes [provider]  atenolol (TENORMIN) 25 MG tablet Take 25 mg by mouth daily.   Yes [provider]  atorvastatin (LIPITOR) 10 MG tablet Take 10 mg by mouth at bedtime.    Yes [provider]  co-enzyme Q-10 50 MG capsule Take 50 mg by mouth daily.   Yes [provider]  colchicine 0.6 MG tablet Take 0.6 mg by mouth daily as needed (FOR GOUT FLARES).    Yes [provider]  diltiazem (CARDIZEM CD) 180 MG 24 hr capsule Take 180 mg by  mouth daily.   Yes [provider]  lisinopril (PRINIVIL,ZESTRIL) 20 MG tablet Take 20 mg by mouth daily.   Yes [provider]  Melatonin 3 MG CAPS Take 3 mg by mouth at bedtime.    Yes [provider]  Misc Natural Products (OSTEO BI-FLEX JOINT SHIELD PO) Take 1 tablet by mouth 2 (two) times daily.    Yes [provider]  Multiple Vitamin (MULTIVITAMIN) tablet Take 1 tablet by mouth daily.   Yes [provider]  Omega-3 Fatty Acids (FISH OIL) 1000 MG CAPS Take 1,000 mg by mouth daily.    Yes [provider]  vitamin C (ASCORBIC ACID) 500 MG tablet Take 500 mg by mouth daily.   Yes [provider]  vitamin E 1000 UNIT capsule Take 1,000 Units by mouth daily.   Yes [provider]  XARELTO 20 MG TABS tablet TAKE ONE TABLET BY MOUTH DAILY WITH SUPPER Patient taking differently: Take 20 mg by mouth daily with supper.  11/21/17  Yes Burnell Blanks, MD  valsartan-hydrochlorothiazide (DIOVAN-HCT) 320-12.5 MG per tablet Take 1 tablet by mouth daily.    [provider]    Inpatient Medications: Scheduled Meds: . allopurinol  300 mg Oral Daily  . atenolol  50 mg Oral Daily  . diltiazem  180 mg Oral Daily  . insulin aspart  0-5 Units Subcutaneous QHS  . insulin aspart  0-9 Units Subcutaneous TID WC   Continuous Infusions: . dextrose 5 % and 0.45 % NaCl with KCl 20 mEq/L 100 mL/hr at 10/31/18 1043  . piperacillin-tazobactam (ZOSYN)  IV 3.375 g (10/31/18 8786)   PRN Meds: guaiFENesin-dextromethorphan, metoprolol tartrate, ondansetron **OR** ondansetron (ZOFRAN) IV  Allergies:    Allergies  Allergen Reactions  . Peanuts [Peanut Oil] Anaphylaxis  . Eggs Or Egg-Derived Products Hives, Itching and Swelling    Social History:   Social History   Socioeconomic History  . Marital status: Married    Spouse name: Not on file  . Number of children: 2  . Years of education: Not on file  . Highest education  level: Not on file  Occupational History  . Occupation: Retired-Linguistics Professor [employer]  Social Needs  . Financial resource strain: Not on file  . Food insecurity:    Worry: Not on file    Inability: Not on file  . Transportation needs:    Medical: Not on file    Non-medical: Not on file  Tobacco Use  . Smoking status: Former Smoker    Last attempt to quit: 12/26/1973    Years since quitting: 44.8  . Smokeless tobacco: Never Used  Substance and Sexual Activity  . Alcohol use: Yes    Alcohol/week: 14.0 standard drinks    Types: 14 Standard drinks or equivalent per week    Comment: OCCASIONAL  . Drug use: No  .  Sexual activity: Not on file  Lifestyle  . Physical activity:    Days per week: Not on file    Minutes per session: Not on file  . Stress: Not on file  Relationships  . Social connections:    Talks on phone: Not on file    Gets together: Not on file    Attends religious service: Not on file    Active member of club or organization: Not on file    Attends meetings of clubs or organizations: Not on file    Relationship status: Not on file  . Intimate partner violence:    Fear of current or ex partner: Not on file    Emotionally abused: Not on file    Physically abused: Not on file    Forced sexual activity: Not on file  Other Topics Concern  . Not on file  Social History Narrative  . Not on file    Family History:    Family History  Problem Relation Age of Onset  . Heart attack Father   . Heart attack Mother   . Colon cancer Neg Hx   . Esophageal cancer Neg Hx   . Rectal cancer Neg Hx   . Stomach cancer Neg Hx      ROS:  Please see the history of present illness.   All other ROS reviewed and negative.     Physical Exam/Data:   Vitals:   10/30/18 2057 10/31/18 0540 10/31/18 0739 10/31/18 0855  BP: 104/65 114/75 113/75   Pulse: 80 (!) 55 (!) 48 62  Resp: 20 20 18    Temp: 98.5 F (36.9 C)  98.1 F (36.7 C)   TempSrc: Oral  Oral   SpO2: 92% 95%  93%   Weight:      Height:        Intake/Output Summary (Last 24 hours) at 10/31/2018 1431 Last data filed at 10/31/2018 1229 Gross per 24 hour  Intake 774.27 ml  Output 650 ml  Net 124.27 ml   Filed Weights   10/30/18 1049  Weight: 122.5 kg   Body mass index is 41.05 kg/m.  General:  Well nourished, well developed, in no acute distress HEENT: normal Lymph: no adenopathy Neck: no JVD Endocrine:  No thryomegaly Vascular: No carotid bruits; FA pulses 2+ bilaterally without bruits  Cardiac:  normal S1, S2; Irregularly irregular rhythm; no murmur  Lungs:  clear to auscultation bilaterally, no wheezing, rhonchi or rales  Abd: soft, nontender, no hepatomegaly  Ext: no edema Musculoskeletal:  No deformities, BUE and BLE strength normal and equal Skin: warm and dry  Neuro:  CNs 2-12 intact, no focal abnormalities noted Psych:  Normal affect   EKG:  The EKG was personally reviewed and demonstrates: Atrial fibrillation at 95 bpm Telemetry:  Telemetry was personally reviewed and demonstrates: Atrial fibrillation with rates in the 50s  Relevant CV Studies:  Echocardiogram 10/15/2015 Study Conclusions - Left ventricle: The cavity size was normal. There was moderate   focal basal hypertrophy. Systolic function was normal. The   estimated ejection fraction was in the range of 60% to 65%. Wall   motion was normal; there were no regional wall motion   abnormalities. The transmitral flow pattern was not recorded. - Aorta: Ascending aortic diameter: 43 mm (S). - Ascending aorta: The ascending aorta was mildly dilated. - Mitral valve: There was trivial regurgitation. - Left atrium: The atrium was severely dilated. - Atrial septum: There was increased thickness of the septum,  consistent with lipomatous hypertrophy.   Laboratory Data:  Chemistry Recent Labs  Lab 10/30/18 1024 10/31/18 0534  NA 137 133*  K 3.0* 2.8*  CL 101 98  CO2 26 27  GLUCOSE 146* 298*  BUN 15 20    CREATININE 0.87 1.67*  CALCIUM 8.6* 7.5*  GFRNONAA >60 38*  GFRAA >60 44*  ANIONGAP 10 8    Recent Labs  Lab 10/30/18 1024 10/31/18 0534  PROT 6.7 5.3*  ALBUMIN 3.7 2.7*  AST 401* 908*  ALT 275* 1,204*  ALKPHOS 130* 132*  BILITOT 1.7* 4.6*   Hematology Recent Labs  Lab 10/30/18 1024 10/31/18 0534  WBC 5.2 7.7  RBC 5.05 3.80*  HGB 15.0 11.8*  HCT 47.0 35.6*  MCV 93.1 93.7  MCH 29.7 31.1  MCHC 31.9 33.1  RDW 13.2 13.6  PLT 172 126*   Cardiac Enzymes Recent Labs  Lab 10/30/18 1024  TROPONINI <0.03   No results for input(s): TROPIPOC in the last 168 hours.  BNPNo results for input(s): BNP, PROBNP in the last 168 hours.  DDimer No results for input(s): DDIMER in the last 168 hours.  Radiology/Studies:  Dg Chest 2 View  Result Date: 10/30/2018 CLINICAL DATA:  Upper abdominal pain EXAM: CHEST - 2 VIEW COMPARISON:  None. FINDINGS: Cardiomegaly. No confluent airspace opacities, effusions or edema. No acute bony abnormality. IMPRESSION: Cardiomegaly.  No active disease. Electronically Signed   By: Rolm Baptise M.D.   On: 10/30/2018 11:37   US Abdomen Limited Ruq  Result Date: 10/30/2018 CLINICAL DATA:  Upper abdominal pain EXAM: ULTRASOUND ABDOMEN LIMITED RIGHT UPPER QUADRANT COMPARISON:  CT abdomen 10/11/2018 FINDINGS: Gallbladder: Cholelithiasis. Largest gallstone 2.5 cm. Associated sludge. Gallbladder wall thickening 8.2 mm. Negative sonographic Murphy sign Common bile duct: Diameter: 3.1 mm Liver: Heterogeneous increased echogenicity liver without focal lesion. Portal vein is patent on color Doppler imaging with normal direction of blood flow towards the liver. IMPRESSION: Cholelithiasis with gallbladder wall thickening suggestive of cholecystitis. However, no acute pain over the gallbladder. Hepatic steatosis Electronically Signed   By: Franchot Gallo M.D.   On: 10/30/2018 11:36    Assessment and Plan:   Permanent atrial fibrillation -Rate controlled on atenolol  and Cardizem. Atenolol dose has been incerased from 25 mg to 50 mg.  -Heart rates are in the 50s.  Patient reports home heart rates occasionally dip into the 40s and he tolerates this well. -Continue current rate management -On Xarelto at home for anticoagulation for stroke risk reduction.  Anticoagulation currently on hold for possible surgical procedure, laparoscopic cholecystectomy. Last dose Sunday evening. CHA2DS2/VAS Stroke Risk Score is 3 (HTN, Age (2)). Will bridge with heparin drip. Resume DOAC postop when OK with surgeon, likely ~72 hours.   Coronary artery calcification -Scattered coronary artery calcifications noted on chest CTA.  Patient is fairly active and has had no anginal symptoms.  Hypertension -Home medications include lisinopril 20 mg, valsartan-hydrochlorothiazide 320-12.5 mg, diltiazem 180 mg, atenolol 25 mg.  There is a question of why the patient is on an ACE inhibitor and an ARB.  He says that he has been on this regimen for many years. -Atenolol increased to 50 mg and Diovan/HCTZ on hold.  -BP initially elevated, now well controlled.  -continue current management  Ascending thoracic aortic aneurysm -Measuring 4.2 cm, stable by CTA done annually over the last 3 years  Diabetes type 2 -Patient has been told that he has borderline diabetes in the past, but never put on any medication. -Blood sugar  has been elevated during this admission.  Currently on SSI.   Hyperlipidemia -Atorvastatin 10 mg daily at home. Followed outpatient by his PCP. Statin on hold due to elevated LFTs  AKI -SCr rose to 1.67 this am from 0.87 yesterday. Unclear reason. IV fluids now infusing.  -Monitor renal function closely. For CMP in am.  Hypokalemia -K+ 2.8 this am. KCl added to IV fluids by primary team. Will provide oral supplementation with 40 mEq q 4h X2.  -CMP and mag ordered for am by primary team  Abdominal pain -Elevated LFTs and ultrasound showed cholelithiasis with  gallbladder wall thickening suggestive of cholecystitis.  The patient has been seen by surgery. He has been started on antibiotics. Plan for ERCP.  Waiting for medical stabilization and possible surgery on Wednesday.  Preoperative evaluation -According to the [facility] Mr. Barkdull is a class I risk with 0.4% risk of major cardiac event perioperatively. He is able to complete over 4 METS of activity without any exertional symptoms. -Mr. Gutridge is at low risk for surgery and no cardiac testing is necessary prior to surgery.  For questions or updates, please contact [place] Please consult [email or web address] for contact info under   Signed, Daune Perch, NP  10/31/2018 2:31 PM   The patient was seen, examined and discussed with Daune Perch, NP-C and I agree with the above.   77 y.o. male with a hx of paroxysmal atrial fibrillation on Xarelto (in a-fib since 2016), hypertension, hyperlipidemia, gout, OSA on CPAP, thoracic aortic aneurysm who is being seen today for preoperative cardiac evaluation prior to laparoscopic CHCE. He is a patient of Dr Angelena Form, last seen on 11/17/2017. He has been asymptomatic with no signs of angina or CHF in the last year, he walks 2-3 miles 3/week and has no symptoms. Also denies palpitations, LE edema, orthopnea or PND. On physical exam he has no signs of CHF or critical valvular disease. Echo from 2016 shows normal LVEF. ECG shows a-fib with non-specific ST T wave abnormalities. His a-fib is rate controlled. He took last dose of Xarelto on Sunday. Repeat CTA in August 2019 showed stable ascending thoracic aortic aneurysm at 4.2 cm.  A/P: No ischemic workup needed prior to scheduled lap CHCE. Bridge with heparin drip from now till surgery, restart xarelto as soon as acceptable from surgical standpoint (cca 36 hours post surgery). We will follow.    Ena Dawley, MD 10/31/2018

## 2018-10-31 NOTE — Progress Notes (Signed)
[facility] PROGRESS NOTE  Tyler Sparks BLT:[id number] DOB: [birth year] DOA: 10/30/2018 PCP: Velna Hatchet, MD   Narrative: 77 year old male Paroxysmal A. fib chads since 2008 score >4 on Xarelto with normal echo HTN HLD Gout OSA CPAP 10 thoracic aortic aneurysm Prediabetes  Admit with right upper quadrant pain with prior episode couple weeks Came to ED work-up showed lipase 27 AST/ALT 400/2 7 5  bilirubin 1.7 chest x-ray cardiomegaly Ultrasound abdomen cholelithiasis 2.5 cm gallbladder thickening and CBD 3.1 He was febrile as well heart rate low 100s  A & Plan Cholelithiasis-MRCP pending-General surgery planning electively at some point GI has been involved by them-cont Zosyn  Atrial fibrillation on Xarelto chads score >4-continue rate control Tenormin 50 daily, Cardizem 180 daily with as needed metoprolol IV 2.5 every 6 as needed-check mag and labs a.m.  Hypokalemia-replace with K in d5-recheck labs and Mag in am  Fatty liver vs acute liver injury? Sepsis vs other causes-Hold statin-recheck am labs  HTN-hold lisinopril 20 for now, Diovan HCT also on hold some therapeutic duplication so discontinue 1 of them prior to discharge please  HLD-holding atorvastatin at bedtime for now  Gout-continue allopurinol 300 lately  OSA-continue CPAP nightly night 10 cc  Thoracic aneurysm-stable  Prediabetes-sliding-scale supplementation  Body mass index is 41.05 kg/m.   DVT prophylaxis: Xarelto code Status: Full family Communication: None disposition Plan: Inpatient pending resolution   Verlon Au, MD  [facility] Direct contact: [phone] --Via amion app OR  --[email or web address]; password [email or web address]  7PM-7AM contact night coverage as above 10/31/2018, 1:25 PM  LOS: 1 day   Consultants:  gen surg  Procedures:  Pending MRCP  Antimicrobials:  zosyn  Interval history/Subjective: Awake alert reading book no pain no vomit no n  Objective:  Vitals:  Vitals:   10/31/18 0739 10/31/18 0855  BP: 113/75   Pulse: (!) 48 62  Resp: 18   Temp: 98.1 F (36.7 C)   SpO2: 93%     Exam:  . Awake alert pleasant obese in nad . eomi ncat . cta b . abd soft nt nd no rebound no gaurd   I have personally reviewed the following:   Labs:  BUn/Creat  15/0.8-->20/1.6  AST 401-908  AST 275->1204  Bili 1.7->4.6  Imaging studies: US IMPRESSION: Cholelithiasis with gallbladder wall thickening suggestive of cholecystitis. However, no acute pain over the gallbladder.   Hepatic steatosis  Medical tests:  y   Test discussed with performing physician:  y  Decision to obtain old records:  y  Review and summation of old records:  y  Scheduled Meds: . allopurinol  300 mg Oral Daily  . atenolol  50 mg Oral Daily  . atorvastatin  10 mg Oral QHS  . diltiazem  180 mg Oral Daily  . insulin aspart  0-5 Units Subcutaneous QHS  . insulin aspart  0-9 Units Subcutaneous TID WC  . lisinopril  20 mg Oral Daily   Continuous Infusions: . dextrose 5 % and 0.45 % NaCl with KCl 20 mEq/L 100 mL/hr at 10/31/18 1043  . piperacillin-tazobactam (ZOSYN)  IV 3.375 g (10/31/18 5456)    Active Problems:   Cholecystitis   LOS: 1 day

## 2018-10-31 NOTE — Progress Notes (Addendum)
[facility] Progress Note     Subjective: CC-  Patient states that he feels much better today. Denies any abdominal pain. Denies n/v. LFTs up today, AST 908, ALT 1204, alk phos 132, and total bilirubin 4.6.  Objective: Vital signs in last 24 hours: Temp:  [97.8 F (36.6 C)-100.4 F (38 C)] 98.1 F (36.7 C) (11/12 0739) Pulse Rate:  [48-104] 48 (11/12 0739) Resp:  [13-23] 18 (11/12 0739) BP: (104-195)/(65-162) 113/75 (11/12 0739) SpO2:  [92 %-100 %] 93 % (11/12 0739) Weight:  [122.5 kg] 122.5 kg (11/11 1049) Last BM Date: 10/30/18  Intake/Output from previous day: 11/11 0701 - 11/12 0700 In: 1774.3 [P.O.:120; I.V.:554.3; IV Piggyback:1100] Out: 400 [Urine:400] Intake/Output this shift: No intake/output data recorded.  PE: Gen:  Alert, NAD, pleasant HEENT: EOM's intact, pupils equal and round Card:  Irregularly irregular Pulm:  CTAB, no W/R/R, effort normal Abd: Soft, protuberant, +BS, no HSM, no hernia, nontender Ext:  Trace 1+ edema BLE Psych: A&Ox3  Skin: no rashes noted, warm and dry  Lab Results:  Recent Labs    10/30/18 1024  WBC 5.2  HGB 15.0  HCT 47.0  PLT 172   BMET Recent Labs    10/30/18 1024 10/31/18 0534  NA 137 133*  K 3.0* 2.8*  CL 101 98  CO2 26 27  GLUCOSE 146* 298*  BUN 15 20  CREATININE 0.87 1.67*  CALCIUM 8.6* 7.5*   PT/INR No results for input(s): LABPROT, INR in the last 72 hours. CMP     Component Value Date/Time   NA 133 (L) 10/31/2018 0534   NA 141 07/31/2018 0746   K 2.8 (L) 10/31/2018 0534   CL 98 10/31/2018 0534   CO2 27 10/31/2018 0534   GLUCOSE 298 (H) 10/31/2018 0534   BUN 20 10/31/2018 0534   BUN 26 07/31/2018 0746   CREATININE 1.67 (H) 10/31/2018 0534   CREATININE 1.18 07/26/2016 0832   CALCIUM 7.5 (L) 10/31/2018 0534   PROT 5.3 (L) 10/31/2018 0534   PROT 6.0 06/25/2014 1121   ALBUMIN 2.7 (L) 10/31/2018 0534   ALBUMIN 4.2 06/25/2014 1121   AST 908 (H) 10/31/2018 0534   ALT 1,204 (H)  10/31/2018 0534   ALKPHOS 132 (H) 10/31/2018 0534   BILITOT 4.6 (H) 10/31/2018 0534   GFRNONAA 38 (L) 10/31/2018 0534   GFRAA 44 (L) 10/31/2018 0534   Lipase     Component Value Date/Time   LIPASE 27 10/30/2018 1024       Studies/Results: Dg Chest 2 View  Result Date: 10/30/2018 CLINICAL DATA:  Upper abdominal pain EXAM: CHEST - 2 VIEW COMPARISON:  None. FINDINGS: Cardiomegaly. No confluent airspace opacities, effusions or edema. No acute bony abnormality. IMPRESSION: Cardiomegaly.  No active disease. Electronically Signed   By: Rolm Baptise M.D.   On: 10/30/2018 11:37   US Abdomen Limited Ruq  Result Date: 10/30/2018 CLINICAL DATA:  Upper abdominal pain EXAM: ULTRASOUND ABDOMEN LIMITED RIGHT UPPER QUADRANT COMPARISON:  CT abdomen 10/11/2018 FINDINGS: Gallbladder: Cholelithiasis. Largest gallstone 2.5 cm. Associated sludge. Gallbladder wall thickening 8.2 mm. Negative sonographic Murphy sign Common bile duct: Diameter: 3.1 mm Liver: Heterogeneous increased echogenicity liver without focal lesion. Portal vein is patent on color Doppler imaging with normal direction of blood flow towards the liver. IMPRESSION: Cholelithiasis with gallbladder wall thickening suggestive of cholecystitis. However, no acute pain over the gallbladder. Hepatic steatosis Electronically Signed   By: Franchot Gallo M.D.   On: 10/30/2018 11:36    Anti-infectives: Anti-infectives (From  admission, onward)   Start     Dose/Rate Route Frequency Ordered Stop   10/30/18 1400  piperacillin-tazobactam (ZOSYN) IVPB 3.375 g     3.375 g 12.5 mL/hr over 240 Minutes Intravenous Every 8 hours 10/30/18 1316     10/30/18 1245  cefTRIAXone (ROCEPHIN) 2 g in sodium chloride 0.9 % 100 mL IVPB     2 g 200 mL/hr over 30 Minutes Intravenous  Once 10/30/18 1234 10/30/18 1338       Assessment/Plan AF - on chronic anitcoagulation - last Dose Xarelto PM 10/26/18 in PM Recent URI/Bronchitis   Gout Hypertension DM Nephrolithiasis - Hematuria - scheduled for [facility] Hyperlipidemia OSA - CPAP Obesity - BMI 41.5  Cholecystitis/symptomatic cholelithiasis Hyperbilirubinemia  - u/s 11/11 showed cholelithiasis with gallbladder wall thickening suggestive of cholecystitis - LFTs up today, AST 908, ALT 1204, alk phos 132, and total bilirubin 4.6  ID - zosyn 11/11>> FEN - IVF, NPO VTE - SCDs, lovenox Foley - none Follow up - TBD  Plan: LFTs elevated today, discussed with GI who recommends ordering MRCP. If this is positive for choledocholithiasis they will see him. Keep NPO and continue IV zosyn. Cardiology consult pending.   LOS: 1 day    Wellington [place] , [facility] 10/31/2018, 7:55 AM Pager: [phone] Mon 7:00 am -11:30 AM Tues-Fri 7:00 am-4:30 pm Sat-Sun 7:00 am-11:30 am

## 2018-10-31 NOTE — Progress Notes (Signed)
ANTICOAGULATION CONSULT NOTE - Initial Consult  Pharmacy Consult for Heparin (Xarelto on hold) Indication: atrial fibrillation  Allergies  Allergen Reactions  . Peanuts [Peanut Oil] Anaphylaxis  . Eggs Or Egg-Derived Products Hives, Itching and Swelling    Patient Measurements: Height: 5\' 8"  (172.7 cm) Weight: 270 lb (122.5 kg) IBW/kg (Calculated) : 68.4 Heparin Dosing Weight: 98 kg  Vital Signs: Temp: 97.4 F (36.3 C) (11/12 1626) Temp Source: Oral (11/12 1626) BP: 113/65 (11/12 1626) Pulse Rate: 57 (11/12 1626)  Labs: Recent Labs    10/30/18 1024 10/31/18 0534  HGB 15.0 11.8*  HCT 47.0 35.6*  PLT 172 126*  CREATININE 0.87 1.67*  TROPONINI <0.03  --     Estimated Creatinine Clearance: 47.9 mL/min (A) (by C-G formula based on SCr of 1.67 mg/dL (H)).   Medical History: Past Medical History:  Diagnosis Date  . Atrial fibrillation ([place])    2008  . Cancer ([place])    skin  . Cholecystitis 10/2018  . Gout   . HTN (hypertension)   . Hyperlipidemia   . Nephrolithiasis   . Peripheral neuropathy   . Sleep apnea    Assessment:  77 yr old male on Xarelto 20 mg daily prior to admission for atrial fibrillation.  Admitted 10/30/18 with symptomatic cholelithiasis/cholecystis.  Xarelto held; to begin IV heparin for bridge.  LFTs elevated, planning MRCP/ GI consult.  CBC trended down, platelet count 126K.     Last Xarelto dose 11/10 at 6:30pm.   Expect heparin levels to be falsely elevated due to recent Xarelto doses, so will plan to use aPTTs to monitor heparin dosing.   Goal of Therapy:  Heparin level 0.3-0.7 units/ml aPTT 66-102 seconds Monitor platelets by anticoagulation protocol: Yes   Plan:   Baseline aPTT and heparin level.  Heparin drip to begin at 1200 units/hr. Conservative start ~12 units/kg adj weight/hr.  aPTT and heparin level ~8 hrs after heparin begins.  Daily aPTT, heparin level and CBC.  Follow up plans for MRCP and later surgery, and timing for  holding IV heparin for procedures.  Xarelto on hold.  Arty Baumgartner, [place] Pager: [phone] or phone: [phone] 10/31/2018,4:55 PM

## 2018-11-01 ENCOUNTER — Inpatient Hospital Stay (HOSPITAL_COMMUNITY): Payer: Medicare Other | Admitting: Certified Registered"

## 2018-11-01 ENCOUNTER — Encounter (HOSPITAL_COMMUNITY): Payer: Self-pay | Admitting: Certified Registered"

## 2018-11-01 ENCOUNTER — Encounter (HOSPITAL_COMMUNITY)
Admission: EM | Disposition: A | Discharge status: Home / Self Care | Payer: Self-pay | Source: Home / Self Care | Attending: Family Medicine

## 2018-11-01 DIAGNOSIS — I4891 Unspecified atrial fibrillation: Secondary | ICD-10-CM

## 2018-11-01 DIAGNOSIS — E785 Hyperlipidemia, unspecified: Secondary | ICD-10-CM

## 2018-11-01 DIAGNOSIS — K819 Cholecystitis, unspecified: Secondary | ICD-10-CM

## 2018-11-01 DIAGNOSIS — I10 Essential (primary) hypertension: Secondary | ICD-10-CM

## 2018-11-01 DIAGNOSIS — N179 Acute kidney failure, unspecified: Secondary | ICD-10-CM

## 2018-11-01 HISTORY — PX: CHOLECYSTECTOMY: SHX55

## 2018-11-01 LAB — CBC WITH DIFFERENTIAL/PLATELET
Abs Immature Granulocytes: 0.03 10*3/uL (ref 0.00–0.07)
Basophils Absolute: 0 10*3/uL (ref 0.0–0.1)
Basophils Relative: 1 %
EOS ABS: 0.2 10*3/uL (ref 0.0–0.5)
EOS PCT: 4 %
HCT: 41.8 % (ref 39.0–52.0)
HEMOGLOBIN: 13.3 g/dL (ref 13.0–17.0)
Immature Granulocytes: 1 %
LYMPHS PCT: 18 %
Lymphs Abs: 0.9 10*3/uL (ref 0.7–4.0)
MCH: 29.8 pg (ref 26.0–34.0)
MCHC: 31.8 g/dL (ref 30.0–36.0)
MCV: 93.5 fL (ref 80.0–100.0)
MONO ABS: 0.3 10*3/uL (ref 0.1–1.0)
Monocytes Relative: 7 %
NRBC: 0 % (ref 0.0–0.2)
Neutro Abs: 3.4 10*3/uL (ref 1.7–7.7)
Neutrophils Relative %: 69 %
Platelets: 134 10*3/uL — ABNORMAL LOW (ref 150–400)
RBC: 4.47 MIL/uL (ref 4.22–5.81)
RDW: 13.7 % (ref 11.5–15.5)
WBC: 4.9 10*3/uL (ref 4.0–10.5)

## 2018-11-01 LAB — COMPREHENSIVE METABOLIC PANEL
ALK PHOS: 127 U/L — AB (ref 38–126)
ALT: 818 U/L — ABNORMAL HIGH (ref 0–44)
AST: 427 U/L — AB (ref 15–41)
Albumin: 2.9 g/dL — ABNORMAL LOW (ref 3.5–5.0)
Anion gap: 7 (ref 5–15)
BILIRUBIN TOTAL: 2.1 mg/dL — AB (ref 0.3–1.2)
BUN: 21 mg/dL (ref 8–23)
CALCIUM: 8.1 mg/dL — AB (ref 8.9–10.3)
CHLORIDE: 104 mmol/L (ref 98–111)
CO2: 29 mmol/L (ref 22–32)
CREATININE: 1.34 mg/dL — AB (ref 0.61–1.24)
GFR, EST AFRICAN AMERICAN: 58 mL/min — AB (ref >60)
GFR, EST NON AFRICAN AMERICAN: 50 mL/min — AB (ref >60)
Glucose, Bld: 129 mg/dL — ABNORMAL HIGH (ref 70–99)
Potassium: 3.4 mmol/L — ABNORMAL LOW (ref 3.5–5.1)
Sodium: 140 mmol/L (ref 135–145)
TOTAL PROTEIN: 5.9 g/dL — AB (ref 6.5–8.1)

## 2018-11-01 LAB — SURGICAL PCR SCREEN
MRSA, PCR: NEGATIVE
STAPHYLOCOCCUS AUREUS: NEGATIVE

## 2018-11-01 LAB — GLUCOSE, CAPILLARY
GLUCOSE-CAPILLARY: 108 mg/dL — AB (ref 70–99)
GLUCOSE-CAPILLARY: 121 mg/dL — AB (ref 70–99)
GLUCOSE-CAPILLARY: 111 mg/dL — AB (ref 70–99)
GLUCOSE-CAPILLARY: 122 mg/dL — AB (ref 70–99)
GLUCOSE-CAPILLARY: 113 mg/dL — AB (ref 70–99)
GLUCOSE-CAPILLARY: 215 mg/dL — AB (ref 70–99)

## 2018-11-01 LAB — MAGNESIUM: Magnesium: 1.9 mg/dL (ref 1.7–2.4)

## 2018-11-01 LAB — APTT: aPTT: 87 seconds — ABNORMAL HIGH (ref 24–36)

## 2018-11-01 LAB — HEPARIN LEVEL (UNFRACTIONATED): Heparin Unfractionated: 0.76 IU/mL — ABNORMAL HIGH (ref 0.30–0.70)

## 2018-11-01 SURGERY — LAPAROSCOPIC CHOLECYSTECTOMY
Anesthesia: General

## 2018-11-01 MED ORDER — SODIUM CHLORIDE 0.9 % IV SOLN
INTRAVENOUS | Status: DC | PRN
  Administered 2018-11-01: 40 ug/min via INTRAVENOUS

## 2018-11-01 MED ORDER — MORPHINE SULFATE (PF) 2 MG/ML IV SOLN: 2.0000 mg | INTRAVENOUS | Status: DC | PRN

## 2018-11-01 MED ORDER — SUGAMMADEX SODIUM 200 MG/2ML IV SOLN
INTRAVENOUS | Status: DC | PRN
  Administered 2018-11-01: 250 mg via INTRAVENOUS

## 2018-11-01 MED ORDER — ARTIFICIAL TEARS OPHTHALMIC OINT
TOPICAL_OINTMENT | OPHTHALMIC | Status: AC
  Filled 2018-11-01: qty 3.5

## 2018-11-01 MED ORDER — LABETALOL HCL 5 MG/ML IV SOLN
INTRAVENOUS | Status: AC
  Filled 2018-11-01: qty 4

## 2018-11-01 MED ORDER — ONDANSETRON HCL 4 MG/2ML IJ SOLN
INTRAMUSCULAR | Status: DC | PRN
  Administered 2018-11-01: 4 mg via INTRAVENOUS

## 2018-11-01 MED ORDER — FENTANYL CITRATE (PF) 250 MCG/5ML IJ SOLN
INTRAMUSCULAR | Status: AC
  Filled 2018-11-01: qty 5

## 2018-11-01 MED ORDER — BUPIVACAINE-EPINEPHRINE (PF) 0.25% -1:200000 IJ SOLN
INTRAMUSCULAR | Status: AC
  Filled 2018-11-01: qty 30

## 2018-11-01 MED ORDER — KCL IN DEXTROSE-NACL 40-5-0.9 MEQ/L-%-% IV SOLN
INTRAVENOUS | Status: DC
  Administered 2018-11-01 – 2018-11-02 (×2): via INTRAVENOUS
  Filled 2018-11-01 (×4): qty 1000

## 2018-11-01 MED ORDER — ONDANSETRON HCL 4 MG/2ML IJ SOLN
INTRAMUSCULAR | Status: AC
  Filled 2018-11-01: qty 2

## 2018-11-01 MED ORDER — LIDOCAINE 2% (20 MG/ML) 5 ML SYRINGE
INTRAMUSCULAR | Status: DC | PRN
  Administered 2018-11-01: 100 mg via INTRAVENOUS

## 2018-11-01 MED ORDER — EPHEDRINE 5 MG/ML INJ
INTRAVENOUS | Status: AC
  Filled 2018-11-01: qty 10

## 2018-11-01 MED ORDER — SUCCINYLCHOLINE CHLORIDE 200 MG/10ML IV SOSY
PREFILLED_SYRINGE | INTRAVENOUS | Status: AC
  Filled 2018-11-01: qty 10

## 2018-11-01 MED ORDER — LIDOCAINE 2% (20 MG/ML) 5 ML SYRINGE
INTRAMUSCULAR | Status: AC
  Filled 2018-11-01: qty 5

## 2018-11-01 MED ORDER — PIPERACILLIN-TAZOBACTAM 3.375 G IVPB
3.3750 g | Freq: Three times a day (TID) | INTRAVENOUS | Status: DC
  Administered 2018-11-01 – 2018-11-02 (×2): 3.375 g via INTRAVENOUS
  Filled 2018-11-01 (×2): qty 50

## 2018-11-01 MED ORDER — EPHEDRINE SULFATE-NACL 50-0.9 MG/10ML-% IV SOSY
PREFILLED_SYRINGE | INTRAVENOUS | Status: DC | PRN
  Administered 2018-11-01 (×2): 10 mg via INTRAVENOUS

## 2018-11-01 MED ORDER — OXYCODONE HCL 5 MG/5ML PO SOLN: 5.0000 mg | Freq: Once | ORAL | Status: AC | PRN

## 2018-11-01 MED ORDER — ALLOPURINOL 300 MG PO TABS
300.0000 mg | ORAL_TABLET | Freq: Every day | ORAL | Status: DC
  Administered 2018-11-02: 300 mg via ORAL
  Filled 2018-11-01: qty 1

## 2018-11-01 MED ORDER — ONDANSETRON HCL 4 MG/2ML IJ SOLN: 4.0000 mg | Freq: Once | INTRAMUSCULAR | Status: DC | PRN

## 2018-11-01 MED ORDER — DEXAMETHASONE SODIUM PHOSPHATE 10 MG/ML IJ SOLN
INTRAMUSCULAR | Status: DC | PRN
  Administered 2018-11-01: 5 mg via INTRAVENOUS

## 2018-11-01 MED ORDER — OXYCODONE HCL 5 MG PO TABS
ORAL_TABLET | ORAL | Status: AC
  Filled 2018-11-01: qty 1

## 2018-11-01 MED ORDER — SUGAMMADEX SODIUM 200 MG/2ML IV SOLN
INTRAVENOUS | Status: AC
  Filled 2018-11-01: qty 4

## 2018-11-01 MED ORDER — DEXAMETHASONE SODIUM PHOSPHATE 10 MG/ML IJ SOLN
INTRAMUSCULAR | Status: AC
  Filled 2018-11-01: qty 1

## 2018-11-01 MED ORDER — SODIUM CHLORIDE 0.9 % IR SOLN
Status: DC | PRN
  Administered 2018-11-01: 1000 mL

## 2018-11-01 MED ORDER — OXYCODONE HCL 5 MG PO TABS
5.0000 mg | ORAL_TABLET | Freq: Once | ORAL | Status: AC | PRN
  Administered 2018-11-01: 5 mg via ORAL

## 2018-11-01 MED ORDER — ACETAMINOPHEN 500 MG PO TABS
1000.0000 mg | ORAL_TABLET | Freq: Three times a day (TID) | ORAL | Status: DC
  Administered 2018-11-01 – 2018-11-02 (×3): 1000 mg via ORAL
  Filled 2018-11-01 (×2): qty 2

## 2018-11-01 MED ORDER — ROCURONIUM BROMIDE 10 MG/ML (PF) SYRINGE
PREFILLED_SYRINGE | INTRAVENOUS | Status: DC | PRN
  Administered 2018-11-01: 10 mg via INTRAVENOUS
  Administered 2018-11-01: 50 mg via INTRAVENOUS
  Administered 2018-11-01: 20 mg via INTRAVENOUS

## 2018-11-01 MED ORDER — PROPOFOL 10 MG/ML IV BOLUS
INTRAVENOUS | Status: AC
  Filled 2018-11-01: qty 40

## 2018-11-01 MED ORDER — 0.9 % SODIUM CHLORIDE (POUR BTL) OPTIME
TOPICAL | Status: DC | PRN
  Administered 2018-11-01: 1000 mL

## 2018-11-01 MED ORDER — FENTANYL CITRATE (PF) 100 MCG/2ML IJ SOLN
25.0000 ug | INTRAMUSCULAR | Status: DC | PRN
  Administered 2018-11-01 (×2): 25 ug via INTRAVENOUS

## 2018-11-01 MED ORDER — OXYCODONE HCL 5 MG PO TABS: 5.0000 mg | ORAL_TABLET | ORAL | Status: DC | PRN

## 2018-11-01 MED ORDER — PROPOFOL 10 MG/ML IV BOLUS
INTRAVENOUS | Status: DC | PRN
  Administered 2018-11-01: 200 mg via INTRAVENOUS

## 2018-11-01 MED ORDER — LACTATED RINGERS IV SOLN
INTRAVENOUS | Status: DC
  Administered 2018-11-01 (×2): via INTRAVENOUS

## 2018-11-01 MED ORDER — FENTANYL CITRATE (PF) 100 MCG/2ML IJ SOLN
INTRAMUSCULAR | Status: AC
  Filled 2018-11-01: qty 2

## 2018-11-01 MED ORDER — FENTANYL CITRATE (PF) 250 MCG/5ML IJ SOLN
INTRAMUSCULAR | Status: DC | PRN
  Administered 2018-11-01: 50 ug via INTRAVENOUS
  Administered 2018-11-01: 100 ug via INTRAVENOUS
  Administered 2018-11-01 (×2): 50 ug via INTRAVENOUS

## 2018-11-01 MED ORDER — BUPIVACAINE-EPINEPHRINE 0.25% -1:200000 IJ SOLN
INTRAMUSCULAR | Status: DC | PRN
  Administered 2018-11-01: 9 mL

## 2018-11-01 MED ORDER — HEMOSTATIC AGENTS (NO CHARGE) OPTIME
TOPICAL | Status: DC | PRN
  Administered 2018-11-01: 1 via TOPICAL

## 2018-11-01 SURGICAL SUPPLY — 36 items
APPLIER CLIP 5 13 M/L LIGAMAX5 (MISCELLANEOUS) ×3
BLADE CLIPPER SURG (BLADE) IMPLANT
CANISTER SUCT 3000ML PPV (MISCELLANEOUS) ×3 IMPLANT
CHLORAPREP W/TINT 26ML (MISCELLANEOUS) ×3 IMPLANT
CLIP APPLIE 5 13 M/L LIGAMAX5 (MISCELLANEOUS) ×1 IMPLANT
COVER SURGICAL LIGHT HANDLE (MISCELLANEOUS) ×3 IMPLANT
COVER WAND RF STERILE (DRAPES) IMPLANT
DERMABOND ADVANCED (GAUZE/BANDAGES/DRESSINGS) ×2
DERMABOND ADVANCED .7 DNX12 (GAUZE/BANDAGES/DRESSINGS) ×1 IMPLANT
ELECT REM PT RETURN 9FT ADLT (ELECTROSURGICAL) ×3
ELECTRODE REM PT RTRN 9FT ADLT (ELECTROSURGICAL) ×1 IMPLANT
GLOVE BIO SURGEON STRL SZ 6 (GLOVE) ×3 IMPLANT
GLOVE INDICATOR 6.5 STRL GRN (GLOVE) ×3 IMPLANT
GOWN STRL REUS W/ TWL LRG LVL3 (GOWN DISPOSABLE) ×4 IMPLANT
GOWN STRL REUS W/TWL LRG LVL3 (GOWN DISPOSABLE) ×8
GRASPER SUT TROCAR 14GX15 (MISCELLANEOUS) ×3 IMPLANT
HEMOSTAT SNOW SURGICEL 2X4 (HEMOSTASIS) ×3 IMPLANT
KIT BASIN OR (CUSTOM PROCEDURE TRAY) ×3 IMPLANT
KIT TURNOVER KIT B (KITS) ×3 IMPLANT
NEEDLE INSUFFLATION 14GA 120MM (NEEDLE) ×3 IMPLANT
NS IRRIG 1000ML POUR BTL (IV SOLUTION) ×3 IMPLANT
PAD ARMBOARD 7.5X6 YLW CONV (MISCELLANEOUS) ×3 IMPLANT
POUCH SPECIMEN RETRIEVAL 10MM (ENDOMECHANICALS) ×3 IMPLANT
SCISSORS LAP 5X35 DISP (ENDOMECHANICALS) ×3 IMPLANT
SET IRRIG TUBING LAPAROSCOPIC (IRRIGATION / IRRIGATOR) ×3 IMPLANT
SLEEVE ENDOPATH XCEL 5M (ENDOMECHANICALS) ×9 IMPLANT
SLEEVE SCD COMPRESS KNEE MED (MISCELLANEOUS) ×3 IMPLANT
SPECIMEN JAR SMALL (MISCELLANEOUS) ×3 IMPLANT
SUT MNCRL AB 4-0 PS2 18 (SUTURE) ×3 IMPLANT
TOWEL OR 17X24 6PK STRL BLUE (TOWEL DISPOSABLE) ×3 IMPLANT
TOWEL OR 17X26 10 PK STRL BLUE (TOWEL DISPOSABLE) IMPLANT
TRAY LAPAROSCOPIC MC (CUSTOM PROCEDURE TRAY) ×3 IMPLANT
TROCAR XCEL NON-BLD 11X100MML (ENDOMECHANICALS) ×3 IMPLANT
TROCAR XCEL NON-BLD 5MMX100MML (ENDOMECHANICALS) ×9 IMPLANT
TUBING INSUFFLATION (TUBING) ×3 IMPLANT
WATER STERILE IRR 1000ML POUR (IV SOLUTION) IMPLANT

## 2018-11-01 NOTE — Anesthesia Procedure Notes (Signed)
Procedure Name: Intubation Date/Time: 11/01/2018 1:00 PM Performed by: Imagene Riches, CRNA Pre-anesthesia Checklist: Patient identified, Emergency Drugs available, Suction available and Patient being monitored Patient Re-evaluated:Patient Re-evaluated prior to induction Oxygen Delivery Method: Circle System Utilized Preoxygenation: Pre-oxygenation with 100% oxygen Induction Type: IV induction Ventilation: Mask ventilation without difficulty Laryngoscope Size: Miller and 3 Grade View: Grade II Tube type: Oral Tube size: 7.5 mm Number of attempts: 1 Airway Equipment and Method: Stylet and Oral airway Placement Confirmation: ETT inserted through vocal cords under direct vision,  positive ETCO2 and breath sounds checked- equal and bilateral Secured at: 25 cm Tube secured with: Tape Dental Injury: Teeth and Oropharynx as per pre-operative assessment

## 2018-11-01 NOTE — Transfer of Care (Signed)
Immediate Anesthesia Transfer of Care Note  Patient: Tyler Sparks  Procedure(s) Performed: LAPAROSCOPIC CHOLECYSTECTOMY (N/A )  Patient Location: PACU  Anesthesia Type:General  Level of Consciousness: awake, alert  and oriented  Airway & Oxygen Therapy: Patient Spontanous Breathing and Patient connected to nasal cannula oxygen  Post-op Assessment: Report given to RN and Post -op Vital signs reviewed and stable  Post vital signs: Reviewed and stable  Last Vitals:  Vitals Value Taken Time  BP 143/79 11/01/2018  2:11 PM  Temp    Pulse 68 11/01/2018  2:12 PM  Resp 21 11/01/2018  2:12 PM  SpO2 94 % 11/01/2018  2:12 PM  Vitals shown include unvalidated device data.  Last Pain:  Vitals:   11/01/18 1108  TempSrc:   PainSc: 0-No pain         Complications: No apparent anesthesia complications

## 2018-11-01 NOTE — Progress Notes (Signed)
[facility] Progress Note     Subjective: CC-  MRCP negative for choledocholithiasis, LFTs downtrending but still elevated. Cleared for surgery by cardiology, heparin drip started yesterday.   Objective: Vital signs in last 24 hours: Temp:  [97.4 F (36.3 C)-98.4 F (36.9 C)] 97.5 F (36.4 C) (11/13 0346) Pulse Rate:  [57-72] 72 (11/13 0346) Resp:  [18-20] 18 (11/13 0346) BP: (113-156)/(65-97) 126/74 (11/13 0346) SpO2:  [95 %-97 %] 97 % (11/13 0346) Last BM Date: 10/30/18  Intake/Output from previous day: 11/12 0701 - 11/13 0700 In: 1833.4 [I.V.:1662.1; IV Piggyback:171.4] Out: 1675 [Urine:1675] Intake/Output this shift: No intake/output data recorded.  PE: Gen:  Alert, NAD, pleasant HEENT: EOM's intact, pupils equal and round Card:  Irregularly irregular Pulm:  CTAB, no W/R/R, effort normal Abd: Soft, protuberant, +BS, no HSM, no hernia, nontender Ext:  Trace 1+ edema BLE Psych: A&Ox3  Skin: no rashes noted, warm and dry  Lab Results:  Recent Labs    10/31/18 0534 11/01/18 0342  WBC 7.7 4.9  HGB 11.8* 13.3  HCT 35.6* 41.8  PLT 126* 134*   BMET Recent Labs    10/31/18 0534 11/01/18 0342  NA 133* 140  K 2.8* 3.4*  CL 98 104  CO2 27 29  GLUCOSE 298* 129*  BUN 20 21  CREATININE 1.67* 1.34*  CALCIUM 7.5* 8.1*   PT/INR No results for input(s): LABPROT, INR in the last 72 hours. CMP     Component Value Date/Time   NA 140 11/01/2018 0342   NA 141 07/31/2018 0746   K 3.4 (L) 11/01/2018 0342   CL 104 11/01/2018 0342   CO2 29 11/01/2018 0342   GLUCOSE 129 (H) 11/01/2018 0342   BUN 21 11/01/2018 0342   BUN 26 07/31/2018 0746   CREATININE 1.34 (H) 11/01/2018 0342   CREATININE 1.18 07/26/2016 0832   CALCIUM 8.1 (L) 11/01/2018 0342   PROT 5.9 (L) 11/01/2018 0342   PROT 6.0 06/25/2014 1121   ALBUMIN 2.9 (L) 11/01/2018 0342   ALBUMIN 4.2 06/25/2014 1121   AST 427 (H) 11/01/2018 0342   ALT 818 (H) 11/01/2018 0342   ALKPHOS 127 (H)  11/01/2018 0342   BILITOT 2.1 (H) 11/01/2018 0342   GFRNONAA 50 (L) 11/01/2018 0342   GFRAA 58 (L) 11/01/2018 0342   Lipase     Component Value Date/Time   LIPASE 27 10/30/2018 1024       Studies/Results: Dg Chest 2 View  Result Date: 10/30/2018 CLINICAL DATA:  Upper abdominal pain EXAM: CHEST - 2 VIEW COMPARISON:  None. FINDINGS: Cardiomegaly. No confluent airspace opacities, effusions or edema. No acute bony abnormality. IMPRESSION: Cardiomegaly.  No active disease. Electronically Signed   By: Rolm Baptise M.D.   On: 10/30/2018 11:37   Mr 3d Recon At Scanner  Result Date: 11/01/2018 CLINICAL DATA:  Abnormal LFTs, cholelithiasis with possible acute cholecystitis on ultrasound EXAM: MRI ABDOMEN WITHOUT AND WITH CONTRAST (INCLUDING MRCP) TECHNIQUE: Multiplanar multisequence MR imaging of the abdomen was performed both before and after the administration of intravenous contrast. Heavily T2-weighted images of the biliary and pancreatic ducts were obtained, and three-dimensional MRCP images were rendered by post processing. CONTRAST:  10 mL Gadovist IV COMPARISON:  Right upper quadrant ultrasound dated 10/30/2018. CT abdomen/pelvis dated 10/11/2018. FINDINGS: Motion degraded images. Lower chest: Lung bases are essentially clear. Hepatobiliary: Mild geographic hepatic steatosis. No suspicious/enhancing hepatic lesions. Distended gallbladder with a dominant 18 mm gallstone and layering gallbladder sludge. Mild gallbladder wall thickening without surrounding inflammatory  changes. This appearance is equivocal for acute cholecystitis and may be reactive to hepatic inflammatory changes given laboratory values. No intrahepatic or extrahepatic ductal dilatation. Common duct measures 4 mm. No choledocholithiasis is seen. Pancreas: Within normal limits. No peripancreatic inflammatory changes. Spleen:  Within normal limits. Adrenals/Urinary Tract:  Adrenal glands are within normal limits. Numerous bilateral  renal cysts, including a dominant left lateral interpolar cyst measuring 2.9 cm. No enhancing renal lesions. No hydronephrosis. Stomach/Bowel: Stomach is within normal limits. Visualized bowel is unremarkable. Vascular/Lymphatic:  No evidence of abdominal aortic aneurysm. No suspicious abdominal lymphadenopathy. Other:  No abdominal ascites. Musculoskeletal: No focal osseous lesions. IMPRESSION: Cholelithiasis with mild gallbladder wall thickening and layering gallbladder sludge. The appearance is equivocal for acute cholecystitis and may be reactive to a hepatic inflammatory changes given laboratory values. No intrahepatic or extrahepatic ductal dilatation. Common duct measures 4 mm. No choledocholithiasis is seen. Mild geographic hepatic steatosis. Electronically Signed   By: Julian Hy M.D.   On: 11/01/2018 07:53   Mr Abdomen Mrcp Moise Boring Contast  Result Date: 11/01/2018 CLINICAL DATA:  Abnormal LFTs, cholelithiasis with possible acute cholecystitis on ultrasound EXAM: MRI ABDOMEN WITHOUT AND WITH CONTRAST (INCLUDING MRCP) TECHNIQUE: Multiplanar multisequence MR imaging of the abdomen was performed both before and after the administration of intravenous contrast. Heavily T2-weighted images of the biliary and pancreatic ducts were obtained, and three-dimensional MRCP images were rendered by post processing. CONTRAST:  10 mL Gadovist IV COMPARISON:  Right upper quadrant ultrasound dated 10/30/2018. CT abdomen/pelvis dated 10/11/2018. FINDINGS: Motion degraded images. Lower chest: Lung bases are essentially clear. Hepatobiliary: Mild geographic hepatic steatosis. No suspicious/enhancing hepatic lesions. Distended gallbladder with a dominant 18 mm gallstone and layering gallbladder sludge. Mild gallbladder wall thickening without surrounding inflammatory changes. This appearance is equivocal for acute cholecystitis and may be reactive to hepatic inflammatory changes given laboratory values. No intrahepatic  or extrahepatic ductal dilatation. Common duct measures 4 mm. No choledocholithiasis is seen. Pancreas: Within normal limits. No peripancreatic inflammatory changes. Spleen:  Within normal limits. Adrenals/Urinary Tract:  Adrenal glands are within normal limits. Numerous bilateral renal cysts, including a dominant left lateral interpolar cyst measuring 2.9 cm. No enhancing renal lesions. No hydronephrosis. Stomach/Bowel: Stomach is within normal limits. Visualized bowel is unremarkable. Vascular/Lymphatic:  No evidence of abdominal aortic aneurysm. No suspicious abdominal lymphadenopathy. Other:  No abdominal ascites. Musculoskeletal: No focal osseous lesions. IMPRESSION: Cholelithiasis with mild gallbladder wall thickening and layering gallbladder sludge. The appearance is equivocal for acute cholecystitis and may be reactive to a hepatic inflammatory changes given laboratory values. No intrahepatic or extrahepatic ductal dilatation. Common duct measures 4 mm. No choledocholithiasis is seen. Mild geographic hepatic steatosis. Electronically Signed   By: Julian Hy M.D.   On: 11/01/2018 07:53   US Abdomen Limited Ruq  Result Date: 10/30/2018 CLINICAL DATA:  Upper abdominal pain EXAM: ULTRASOUND ABDOMEN LIMITED RIGHT UPPER QUADRANT COMPARISON:  CT abdomen 10/11/2018 FINDINGS: Gallbladder: Cholelithiasis. Largest gallstone 2.5 cm. Associated sludge. Gallbladder wall thickening 8.2 mm. Negative sonographic Murphy sign Common bile duct: Diameter: 3.1 mm Liver: Heterogeneous increased echogenicity liver without focal lesion. Portal vein is patent on color Doppler imaging with normal direction of blood flow towards the liver. IMPRESSION: Cholelithiasis with gallbladder wall thickening suggestive of cholecystitis. However, no acute pain over the gallbladder. Hepatic steatosis Electronically Signed   By: Franchot Gallo M.D.   On: 10/30/2018 11:36    Anti-infectives: Anti-infectives (From admission, onward)    Start     Dose/Rate Route  Frequency Ordered Stop   10/30/18 1400  piperacillin-tazobactam (ZOSYN) IVPB 3.375 g     3.375 g 12.5 mL/hr over 240 Minutes Intravenous Every 8 hours 10/30/18 1316     10/30/18 1245  cefTRIAXone (ROCEPHIN) 2 g in sodium chloride 0.9 % 100 mL IVPB     2 g 200 mL/hr over 30 Minutes Intravenous  Once 10/30/18 1234 10/30/18 1338       Assessment/Plan AF - on chronic anitcoagulation - last Dose Xarelto PM 10/26/18 in PM Recent URI/Bronchitis  Gout Hypertension DM Nephrolithiasis - Hematuria - scheduled for [facility] Hyperlipidemia OSA - CPAP Obesity - BMI 41.5  Cholecystitis/symptomatic cholelithiasis Hyperbilirubinemia  - u/s 11/11 showed cholelithiasis with gallbladder wall thickening suggestive of cholecystitis - LFTs up but now downtrending MRCP negative  ID - zosyn 11/11>> FEN - IVF, NPO VTE - SCDs, lovenox Foley - none Follow up - TBD  Plan: OR today. We again discussed risks of surgery including bleeding, pain, scarring, intraabdominal injury specifically to the common bile duct and sequelae, conversion to open surgery, blood clot, pneumonia, heart attack or arrhythmia, stroke, failure to resolve symptoms, etc/ Questions welcomed and answered. Heparin drip paused at 08:15 and requested RN to come disconnect.      LOS: 2 days    Clovis Riley , MD [facility] 11/01/2018, 8:20 AM

## 2018-11-01 NOTE — Progress Notes (Signed)
[place] for Heparin  Indication: atrial fibrillation  Allergies  Allergen Reactions  . Peanuts [Peanut Oil] Anaphylaxis  . Eggs Or Egg-Derived Products Hives, Itching and Swelling    Patient Measurements: Height: 5\' 8"  (172.7 cm) Weight: 270 lb (122.5 kg) IBW/kg (Calculated) : 68.4 Heparin Dosing Weight: 98 kg  Vital Signs: Temp: 97.5 F (36.4 C) (11/13 0346) Temp Source: Oral (11/13 0346) BP: 126/74 (11/13 0346) Pulse Rate: 72 (11/13 0346)  Labs: Recent Labs    10/30/18 1024 10/31/18 0534 10/31/18 1716 11/01/18 0342  HGB 15.0 11.8*  --  13.3  HCT 47.0 35.6*  --  41.8  PLT 172 126*  --  134*  APTT  --   --  42* 87*  HEPARINUNFRC  --   --  0.68 0.76*  CREATININE 0.87 1.67*  --  1.34*  TROPONINI <0.03  --   --   --     Estimated Creatinine Clearance: 59.7 mL/min (A) (by C-G formula based on SCr of 1.34 mg/dL (H)).  Assessment: 77 y.o. male with h/o Afib, Xarelto on hold, for heparin   Goal of Therapy:  Heparin level 0.3-0.7 units/ml aPTT 66-102 seconds Monitor platelets by anticoagulation protocol: Yes   Plan:  Continue Heparin at current rate  Phillis Knack, PharmD, BCPS  11/01/2018,4:48 AM

## 2018-11-01 NOTE — Discharge Instructions (Addendum)
[facility]  Please arrive at least 30 min before your appointment to complete your check in paperwork.  If you are unable to arrive 30 min prior to your appointment time we may have to cancel or reschedule you. LAPAROSCOPIC SURGERY: POST OP INSTRUCTIONS Always review your discharge instruction sheet given to you by the facility where your surgery was performed. IF YOU HAVE DISABILITY OR FAMILY LEAVE FORMS, YOU MUST BRING THEM TO THE OFFICE FOR PROCESSING.   DO NOT GIVE THEM TO YOUR DOCTOR.  PAIN CONTROL  1. First take acetaminophen (Tylenol) to control your pain after surgery.  Follow directions on package.  Taking acetaminophen (Tylenol) regularly after surgery will help to control your pain and lower the amount of prescription pain medication you may need.  You should not take more than 4,000 mg (4 grams) of acetaminophen (Tylenol) in 24 hours.    2. Use ice packs to help control pain.  HOME MEDICATIONS 3. Take your usually prescribed medications unless otherwise directed.  DIET 4. You should follow a light diet the first few days after arrival home.  Be sure to include lots of fluids daily. Avoid fatty, fried foods.   CONSTIPATION 5. It is common to experience some constipation after surgery and if you are taking pain medication.  Increasing fluid intake and taking a stool softener (such as Colace) will usually help or prevent this problem from occurring.  A mild laxative (Milk of Magnesia or Miralax) should be taken according to package instructions if there are no bowel movements after 48 hours.  WOUND/INCISION CARE 6. Most patients will experience some swelling and bruising in the area of the incisions.  Ice packs will help.  Swelling and bruising can take several days to resolve.  7. Unless discharge instructions indicate otherwise, follow guidelines below  a. STERI-STRIPS - you may remove your outer bandages 48 hours after surgery, and you may shower at that  time.  You have steri-strips (small skin tapes) in place directly over the incision.  These strips should be left on the skin for 7-10 days.   b. DERMABOND/SKIN GLUE - you may shower in 24 hours.  The glue will flake off over the next 2-3 weeks. 8. Any sutures or staples will be removed at the office during your follow-up visit.  ACTIVITIES 9. You may resume regular (light) daily activities beginning the next day--such as daily self-care, walking, climbing stairs--gradually increasing activities as tolerated.  You may have sexual intercourse when it is comfortable.  Refrain from any heavy lifting or straining until approved by your doctor. a. You may drive when you are no longer taking prescription pain medication, you can comfortably wear a seatbelt, and you can safely maneuver your car and apply brakes.  FOLLOW-UP 10. You should see your doctor in the office for a follow-up appointment approximately 2-3 weeks after your surgery.  You should have been given your post-op/follow-up appointment when your surgery was scheduled.  If you did not receive a post-op/follow-up appointment, make sure that you call for this appointment within a day or two after you arrive home to insure a convenient appointment time.  OTHER INSTRUCTIONS  WHEN TO CALL YOUR DOCTOR: 1. Fever over 101.0 2. Inability to urinate 3. Continued bleeding from incision. 4. Increased pain, redness, or drainage from the incision. 5. Increasing abdominal pain  The clinic staff is available to answer your questions during regular business hours.  Please dont hesitate to call and ask to speak  to one of the nurses for clinical concerns.  If you have a medical emergency, go to the nearest emergency room or call 911.  A surgeon from [facility] is always on call at the hospital. [address], [place], [place], [place]  [address] ? [address], [place], [place]   [address] [phone] ? [phone] ? FAX [phone]  [phone]    Information on my medicine - XARELTO (Rivaroxaban)  This medication education was reviewed with me or my healthcare representative as part of my discharge preparation.    Why was Xarelto prescribed for you? Xarelto was prescribed for you to reduce the risk of a blood clot forming that can cause a stroke if you have a medical condition called atrial fibrillation (a type of irregular heartbeat).  What do you need to know about xarelto ? Take your Xarelto ONCE DAILY at the same time every day with your evening meal. If you have difficulty swallowing the tablet whole, you may crush it and mix in applesauce just prior to taking your dose.  Take Xarelto exactly as prescribed by your doctor and DO NOT stop taking Xarelto without talking to the doctor who prescribed the medication.  Stopping without other stroke prevention medication to take the place of Xarelto may increase your risk of developing a clot that causes a stroke.  Refill your prescription before you run out.  After discharge, you should have regular check-up appointments with your healthcare provider that is prescribing your Xarelto.  In the future your dose may need to be changed if your kidney function or weight changes by a significant amount.  What do you do if you miss a dose? If you are taking Xarelto ONCE DAILY and you miss a dose, take it as soon as you remember on the same day then continue your regularly scheduled once daily regimen the next day. Do not take two doses of Xarelto at the same time or on the same day.   Important Safety Information A possible side effect of Xarelto is bleeding. You should call your healthcare provider right away if you experience any of the following: ? Bleeding from an injury or your nose that does not stop. ? Unusual colored urine (red or dark brown) or unusual colored stools (red or black). ? Unusual bruising for unknown reasons. ? A serious fall or if you hit your  head (even if there is no bleeding).  Some medicines may interact with Xarelto and might increase your risk of bleeding while on Xarelto. To help avoid this, consult your healthcare provider or pharmacist prior to using any new prescription or non-prescription medications, including herbals, vitamins, non-steroidal anti-inflammatory drugs (NSAIDs) and supplements.  This website has more information on Xarelto: [email or web address]

## 2018-11-01 NOTE — Progress Notes (Signed)
PROGRESS NOTE    Tyler Sparks  NLZ:[id number] DOB: May 12, 1941 DOA: 10/30/2018 PCP: Velna Hatchet, MD   Brief Narrative: Tyler Sparks is a 77 y.o. male with a history of atrial fibrillation, hypertension, hyperlipidemia.  Patient presented secondary to abdominal pain and was found to have cholecystitis.  General surgery was consulted and performed laparoscopic cholecystectomy on 11/13.   Assessment & Plan:   Principal Problem:   Cholecystitis Active Problems:   Atrial fibrillation (HCC)   Essential hypertension   AKI (acute kidney injury) ([place])   Hyperlipidemia   Acute cholecystitis Patient is s/p laparoscopic cholecystectomy on 11/13.  No ductal dilation on MRCP done on 11/12 -General surgery recommendations  Atrial fibrillation Rate controlled. -Continue atenolol, Cardizem -Xarelto held secondary to surgery  Essential hypertension -Continue atenolol, diltiazem -Holding lisinopril in setting of acute kidney injury  Acute kidney injury Baseline creatinine of about 1.  Creatinine of 1.67 on admission down to 9.02. -Repeat metabolic panel in the morning  Hyperlipidemia -Continue Lipitor   Pressure Injury Documentation:     DVT prophylaxis: SCDs, restart Xarelto when okay per surgery Code Status:   Code Status: Full Code Family Communication: None at bedside Disposition Plan: Anticipate discharge home in 24 to 48 hours pending general surgery recommendations   Consultants:   General surgery  Cardiology  Procedures:   11/13: Laparoscopic cholecystectomy  Antimicrobials:  None   Subjective: Patient reports mild right upper quadrant pain.  Objective: Vitals:   11/01/18 1455 11/01/18 1510 11/01/18 1525 11/01/18 1603  BP: 126/75 134/84 134/72 (!) 147/90  Pulse: 65 63 60 (!) 54  Resp: 16 13 14 18   Temp:  (!) 97.3 F (36.3 C)  (!) 97.5 F (36.4 C)  TempSrc:    Oral  SpO2: 96% 98% 96% 94%  Weight:      Height:        Intake/Output  Summary (Last 24 hours) at 11/01/2018 1942 Last data filed at 11/01/2018 1406 Gross per 24 hour  Intake 3073.61 ml  Output 1400 ml  Net 1673.61 ml   Filed Weights   10/30/18 1049 11/01/18 1108  Weight: 122.5 kg 122.5 kg    Examination:  General exam: Appears calm and comfortable Respiratory system: Clear to auscultation. Respiratory effort normal. Cardiovascular system: S1 & S2 heard, RRR. No murmurs, rubs, gallops or clicks. Gastrointestinal system: Abdomen is nondistended, soft and nontender. No organomegaly or masses felt. Normal bowel sounds heard. Central nervous system: Alert and oriented. No focal neurological deficits. Extremities: No edema. No calf tenderness Skin: No cyanosis. No rashes Psychiatry: Judgement and insight appear normal. Mood & affect appropriate.     Data Reviewed: I have personally reviewed following labs and imaging studies  CBC: Recent Labs  Lab 10/30/18 1024 10/31/18 0534 11/01/18 0342  WBC 5.2 7.7 4.9  NEUTROABS 4.1  --  3.4  HGB 15.0 11.8* 13.3  HCT 47.0 35.6* 41.8  MCV 93.1 93.7 93.5  PLT 172 126* 409*   Basic Metabolic Panel: Recent Labs  Lab 10/30/18 1024 10/31/18 0534 11/01/18 0342  NA 137 133* 140  K 3.0* 2.8* 3.4*  CL 101 98 104  CO2 26 27 29   GLUCOSE 146* 298* 129*  BUN 15 20 21   CREATININE 0.87 1.67* 1.34*  CALCIUM 8.6* 7.5* 8.1*  MG  --   --  1.9   GFR: Estimated Creatinine Clearance: 59.7 mL/min (A) (by C-G formula based on SCr of 1.34 mg/dL (H)). Liver Function Tests: Recent Labs  Lab 10/30/18 1024 10/31/18 0534  11/01/18 0342  AST 401* 908* 427*  ALT 275* 1,204* 818*  ALKPHOS 130* 132* 127*  BILITOT 1.7* 4.6* 2.1*  PROT 6.7 5.3* 5.9*  ALBUMIN 3.7 2.7* 2.9*   Recent Labs  Lab 10/30/18 1024  LIPASE 27   No results for input(s): AMMONIA in the last 168 hours. Coagulation Profile: No results for input(s): INR, PROTIME in the last 168 hours. Cardiac Enzymes: Recent Labs  Lab 10/30/18 1024    TROPONINI <0.03   BNP (last 3 results) No results for input(s): PROBNP in the last 8760 hours. HbA1C: No results for input(s): HGBA1C in the last 72 hours. CBG: Recent Labs  Lab 11/01/18 0342 11/01/18 0827 11/01/18 1121 11/01/18 1414 11/01/18 1603  GLUCAP 108* 121* 111* 122* 113*   Lipid Profile: No results for input(s): CHOL, HDL, LDLCALC, TRIG, CHOLHDL, LDLDIRECT in the last 72 hours. Thyroid Function Tests: No results for input(s): TSH, T4TOTAL, FREET4, T3FREE, THYROIDAB in the last 72 hours. Anemia Panel: No results for input(s): VITAMINB12, FOLATE, FERRITIN, TIBC, IRON, RETICCTPCT in the last 72 hours. Sepsis Labs: No results for input(s): PROCALCITON, LATICACIDVEN in the last 168 hours.  Recent Results (from the past 240 hour(s))  Surgical pcr screen     Status: None   Collection Time: 10/31/18 11:37 PM  Result Value Ref Range Status   MRSA, PCR NEGATIVE NEGATIVE Final   Staphylococcus aureus NEGATIVE NEGATIVE Final    Comment: (NOTE) The Xpert SA Assay (FDA approved for NASAL specimens in patients 56 years of age and older), is one component of a comprehensive surveillance program. It is not intended to diagnose infection nor to guide or monitor treatment. Performed at [facility], [place] [address], [place], [place] [address]          Radiology Studies: Mr 3d Recon At Scanner  Result Date: 11/01/2018 CLINICAL DATA:  Abnormal LFTs, cholelithiasis with possible acute cholecystitis on ultrasound EXAM: MRI ABDOMEN WITHOUT AND WITH CONTRAST (INCLUDING MRCP) TECHNIQUE: Multiplanar multisequence MR imaging of the abdomen was performed both before and after the administration of intravenous contrast. Heavily T2-weighted images of the biliary and pancreatic ducts were obtained, and three-dimensional MRCP images were rendered by post processing. CONTRAST:  10 mL Gadovist IV COMPARISON:  Right upper quadrant ultrasound dated 10/30/2018. CT abdomen/pelvis dated  10/11/2018. FINDINGS: Motion degraded images. Lower chest: Lung bases are essentially clear. Hepatobiliary: Mild geographic hepatic steatosis. No suspicious/enhancing hepatic lesions. Distended gallbladder with a dominant 18 mm gallstone and layering gallbladder sludge. Mild gallbladder wall thickening without surrounding inflammatory changes. This appearance is equivocal for acute cholecystitis and may be reactive to hepatic inflammatory changes given laboratory values. No intrahepatic or extrahepatic ductal dilatation. Common duct measures 4 mm. No choledocholithiasis is seen. Pancreas: Within normal limits. No peripancreatic inflammatory changes. Spleen:  Within normal limits. Adrenals/Urinary Tract:  Adrenal glands are within normal limits. Numerous bilateral renal cysts, including a dominant left lateral interpolar cyst measuring 2.9 cm. No enhancing renal lesions. No hydronephrosis. Stomach/Bowel: Stomach is within normal limits. Visualized bowel is unremarkable. Vascular/Lymphatic:  No evidence of abdominal aortic aneurysm. No suspicious abdominal lymphadenopathy. Other:  No abdominal ascites. Musculoskeletal: No focal osseous lesions. IMPRESSION: Cholelithiasis with mild gallbladder wall thickening and layering gallbladder sludge. The appearance is equivocal for acute cholecystitis and may be reactive to a hepatic inflammatory changes given laboratory values. No intrahepatic or extrahepatic ductal dilatation. Common duct measures 4 mm. No choledocholithiasis is seen. Mild geographic hepatic steatosis. Electronically Signed   By: Julian Hy  M.D.   On: 11/01/2018 07:53   Mr Abdomen Mrcp Moise Boring Contast  Result Date: 11/01/2018 CLINICAL DATA:  Abnormal LFTs, cholelithiasis with possible acute cholecystitis on ultrasound EXAM: MRI ABDOMEN WITHOUT AND WITH CONTRAST (INCLUDING MRCP) TECHNIQUE: Multiplanar multisequence MR imaging of the abdomen was performed both before and after the administration of  intravenous contrast. Heavily T2-weighted images of the biliary and pancreatic ducts were obtained, and three-dimensional MRCP images were rendered by post processing. CONTRAST:  10 mL Gadovist IV COMPARISON:  Right upper quadrant ultrasound dated 10/30/2018. CT abdomen/pelvis dated 10/11/2018. FINDINGS: Motion degraded images. Lower chest: Lung bases are essentially clear. Hepatobiliary: Mild geographic hepatic steatosis. No suspicious/enhancing hepatic lesions. Distended gallbladder with a dominant 18 mm gallstone and layering gallbladder sludge. Mild gallbladder wall thickening without surrounding inflammatory changes. This appearance is equivocal for acute cholecystitis and may be reactive to hepatic inflammatory changes given laboratory values. No intrahepatic or extrahepatic ductal dilatation. Common duct measures 4 mm. No choledocholithiasis is seen. Pancreas: Within normal limits. No peripancreatic inflammatory changes. Spleen:  Within normal limits. Adrenals/Urinary Tract:  Adrenal glands are within normal limits. Numerous bilateral renal cysts, including a dominant left lateral interpolar cyst measuring 2.9 cm. No enhancing renal lesions. No hydronephrosis. Stomach/Bowel: Stomach is within normal limits. Visualized bowel is unremarkable. Vascular/Lymphatic:  No evidence of abdominal aortic aneurysm. No suspicious abdominal lymphadenopathy. Other:  No abdominal ascites. Musculoskeletal: No focal osseous lesions. IMPRESSION: Cholelithiasis with mild gallbladder wall thickening and layering gallbladder sludge. The appearance is equivocal for acute cholecystitis and may be reactive to a hepatic inflammatory changes given laboratory values. No intrahepatic or extrahepatic ductal dilatation. Common duct measures 4 mm. No choledocholithiasis is seen. Mild geographic hepatic steatosis. Electronically Signed   By: Julian Hy M.D.   On: 11/01/2018 07:53        Scheduled Meds: . acetaminophen  1,000 mg  Oral Q8H  . allopurinol  300 mg Oral Daily  . atenolol  50 mg Oral Daily  . diltiazem  180 mg Oral Daily  . fentaNYL      . insulin aspart  0-5 Units Subcutaneous QHS  . insulin aspart  0-9 Units Subcutaneous TID WC  . oxyCODONE       Continuous Infusions: . dextrose 5 % and 0.9 % NaCl with KCl 40 mEq/L 75 mL/hr at 11/01/18 1807  . lactated ringers 10 mL/hr at 11/01/18 1114  . piperacillin-tazobactam (ZOSYN)  IV       LOS: 2 days     Cordelia Poche, MD [facility] 11/01/2018, 7:42 PM  If 7PM-7AM, please contact night-coverage [email or web address]

## 2018-11-01 NOTE — Progress Notes (Signed)
[place] for Heparin  Indication: atrial fibrillation  Allergies  Allergen Reactions  . Peanuts [Peanut Oil] Anaphylaxis  . Eggs Or Egg-Derived Products Hives, Itching and Swelling    Patient Measurements: Height: 5' 7.99" (172.7 cm) Weight: 270 lb (122.5 kg) IBW/kg (Calculated) : 68.38 Heparin Dosing Weight: 98 kg  Vital Signs: Temp: 97.3 F (36.3 C) (11/13 1510) Temp Source: Oral (11/13 0828) BP: 134/72 (11/13 1525) Pulse Rate: 60 (11/13 1525)  Labs: Recent Labs    10/30/18 1024 10/31/18 0534 10/31/18 1716 11/01/18 0342  HGB 15.0 11.8*  --  13.3  HCT 47.0 35.6*  --  41.8  PLT 172 126*  --  134*  APTT  --   --  42* 87*  HEPARINUNFRC  --   --  0.68 0.76*  CREATININE 0.87 1.67*  --  1.34*  TROPONINI <0.03  --   --   --     Estimated Creatinine Clearance: 59.7 mL/min (A) (by C-G formula based on SCr of 1.34 mg/dL (H)).  Assessment: 77 y.o. male with h/o Afib, Xarelto on hold, for heparin.   Underwent laparoscopic cholecystectomy today finding hydrops of the gallbladder. Consult post-procedure placed to hold on starting heparin infusion pending AM labs on 11/14.   Goal of Therapy:  Heparin level 0.3-0.7 units/ml aPTT 66-102 seconds Monitor platelets by anticoagulation protocol: Yes   Plan:  Hold heparin infusion restart until AM labs on 11/14  Doylene Canard, PharmD Clinical Pharmacist  Pager: [phone] Phone: [phone]  11/01/2018,3:38 PM

## 2018-11-01 NOTE — Op Note (Signed)
Operative Note  Tyler Sparks 77 y.o. male [id number]  11/01/2018  Surgeon: Clovis Riley MD  Assistant: Will jennings, PA-C  Procedure performed: Laparoscopic Cholecystectomy  Preop diagnosis: acute cholecystitis Post-op diagnosis/intraop findings: same, hydrops of the gallbladder  Specimens: gallbladder  EBL: minimal  Complications: none  Description of procedure: After obtaining informed consent the patient was brought to the operating room. Prophylactic antibiotics and subcutaneous heparin were administered. SCD's were applied. General endotracheal anesthesia was initiated and a formal time-out was performed. The abdomen was prepped and draped in the usual sterile fashion and the abdomen was entered using visiport technique in the left upper quadrant after instilling the site with local. Insufflation to 91mmHg was obtained and gross inspection revealed no evidence of injury from our entry or other intraabdominal abnormalities. Three 49mm trocars were introduced in the supraumbilical, right midclavicular and right anterior axillary lines under direct visualization and following infiltration with local. An 105mm trocar was placed in the epigastrium. The gallbladder was tensely distended, erythematous and dusky in appearance. Decompression with the nezhat evacuated clear fluid. The gallbladder could then be retracted cephalad and the infundibulum was retracted laterally. Some omental adhesions were taken down with cautery. A combination of hook electrocautery and blunt dissection was utilized to clear the peritoneum from the neck and cystic duct, circumferentially isolating the cystic artery and cystic duct and lifting the gallbladder from the cystic plate. The critical view of safety was achieved with the cystic artery, cystic duct, and liver bed visualized between them with no other structures. The artery was clipped with a two clips proximally and distally and divided as was the cystic duct  with three clips on the proximal end. The gallbladder was dissected from the liver plate using electrocautery. Once freed the gallbladder was placed in an endocatch bag and removed intact through the epigastric trocar site. A small amount of bleeding on the liver bed was controlled with cautery. The right upper quadrant was aspirated and irrigated; the effluent was clear. Hemostasis was once again confirmed, and reinspection of the abdomen revealed no injuries. The clips were well opposed without any bile leak from the duct or the liver bed, but with some residual fat on the somewhat wide cystic duct I elected to place a PDS endoloop just proximal to the clips to additionally secure the occlusion of the ligated cystic dyct. A piece of surgicel snow was placed in the liver bed.  The 29mm trocar site in the epigastrium was closed with two 0 vicryls in the fascia under direct visualization using a PMI device. The abdomen was desufflated and all trocars removed. The skin incisions were closed with running subcuticular monocryl and Dermabond. The patient was awakened, extubated and transported to the recovery room in stable condition.   All counts were correct at the completion of the case.

## 2018-11-01 NOTE — Anesthesia Preprocedure Evaluation (Addendum)
Anesthesia Evaluation  Patient identified by MRN, date of birth, ID band Patient awake    Reviewed: Allergy & Precautions, NPO status , Patient's Chart, lab work & pertinent test results, reviewed documented beta blocker date and time   History of Anesthesia Complications Negative for: history of anesthetic complications  Airway Mallampati: IV  TM Distance: >3 FB Neck ROM: Full    Dental no notable dental hx.    Pulmonary sleep apnea , former smoker,    Pulmonary exam normal        Cardiovascular hypertension, Pt. on medications and Pt. on home beta blockers + dysrhythmias Atrial Fibrillation  Rhythm:Irregular  TTE 10/15/15: EF 60-65%, otherwise unremarkable   Neuro/Psych negative neurological ROS  negative psych ROS   GI/Hepatic negative GI ROS, Neg liver ROS,   Endo/Other  Morbid obesity  Renal/GU negative Renal ROS  negative genitourinary   Musculoskeletal Gout   Abdominal   Peds  Hematology negative hematology ROS (+)   Anesthesia Other Findings   Reproductive/Obstetrics                            Anesthesia Physical Anesthesia Plan  ASA: III  Anesthesia Plan: General   Post-op Pain Management:    Induction: Intravenous  PONV Risk Score and Plan: 3 and Ondansetron, Dexamethasone and Treatment may vary due to age or medical condition  Airway Management Planned: Oral ETT  Additional Equipment: None  Intra-op Plan:   Post-operative Plan: Extubation in OR  Informed Consent: I have reviewed the patients History and Physical, chart, labs and discussed the procedure including the risks, benefits and alternatives for the proposed anesthesia with the patient or authorized representative who has indicated his/her understanding and acceptance.     Plan Discussed with:   Anesthesia Plan Comments:        Anesthesia Quick Evaluation

## 2018-11-02 ENCOUNTER — Encounter (HOSPITAL_COMMUNITY): Payer: Self-pay | Admitting: Surgery

## 2018-11-02 LAB — CBC
HCT: 40.8 % (ref 39.0–52.0)
Hemoglobin: 12.8 g/dL — ABNORMAL LOW (ref 13.0–17.0)
MCH: 29.8 pg (ref 26.0–34.0)
MCHC: 31.4 g/dL (ref 30.0–36.0)
MCV: 94.9 fL (ref 80.0–100.0)
NRBC: 0 % (ref 0.0–0.2)
PLATELETS: 167 10*3/uL (ref 150–400)
RBC: 4.3 MIL/uL (ref 4.22–5.81)
RDW: 13.5 % (ref 11.5–15.5)
WBC: 4.8 10*3/uL (ref 4.0–10.5)

## 2018-11-02 LAB — COMPREHENSIVE METABOLIC PANEL
ALBUMIN: 2.7 g/dL — AB (ref 3.5–5.0)
ALK PHOS: 111 U/L (ref 38–126)
ALT: 556 U/L — ABNORMAL HIGH (ref 0–44)
ANION GAP: 7 (ref 5–15)
AST: 159 U/L — ABNORMAL HIGH (ref 15–41)
BUN: 13 mg/dL (ref 8–23)
CHLORIDE: 104 mmol/L (ref 98–111)
CO2: 27 mmol/L (ref 22–32)
Calcium: 8.4 mg/dL — ABNORMAL LOW (ref 8.9–10.3)
Creatinine, Ser: 0.89 mg/dL (ref 0.61–1.24)
GFR calc Af Amer: >60 mL/min (ref >60)
GFR calc non Af Amer: >60 mL/min (ref >60)
GLUCOSE: 173 mg/dL — AB (ref 70–99)
Potassium: 4.2 mmol/L (ref 3.5–5.1)
SODIUM: 138 mmol/L (ref 135–145)
Total Bilirubin: 1.2 mg/dL (ref 0.3–1.2)
Total Protein: 5.7 g/dL — ABNORMAL LOW (ref 6.5–8.1)

## 2018-11-02 LAB — GLUCOSE, CAPILLARY
Glucose-Capillary: 169 mg/dL — ABNORMAL HIGH (ref 70–99)
Glucose-Capillary: 150 mg/dL — ABNORMAL HIGH (ref 70–99)

## 2018-11-02 LAB — PROTIME-INR
INR: 1.06
Prothrombin Time: 13.7 seconds (ref 11.4–15.2)

## 2018-11-02 LAB — HEPARIN LEVEL (UNFRACTIONATED)

## 2018-11-02 LAB — APTT: aPTT: 37 seconds — ABNORMAL HIGH (ref 24–36)

## 2018-11-02 MED ORDER — RIVAROXABAN 20 MG PO TABS
20.0000 mg | ORAL_TABLET | Freq: Every day | ORAL | Status: DC
  Filled 2018-11-02: qty 1

## 2018-11-02 NOTE — Progress Notes (Addendum)
[facility] Progress Note  1 Day Post-Op  Subjective: CC-  Doing well this morning. Abdominal pain is well controlled, only taking tylenol. Tolerating clear liquids. Denies n/v. Ambulated in the halls last night without any issues.  Objective: Vital signs in last 24 hours: Temp:  [97.3 F (36.3 C)-97.7 F (36.5 C)] 97.7 F (36.5 C) (11/14 0536) Pulse Rate:  [54-74] 74 (11/14 0746) Resp:  [12-20] 18 (11/14 0746) BP: (126-166)/(72-97) 166/83 (11/14 0746) SpO2:  [92 %-98 %] 97 % (11/14 0746) Weight:  [122.5 kg] 122.5 kg (11/13 1108) Last BM Date: 10/30/18  Intake/Output from previous day: 11/13 0701 - 11/14 0700 In: 2793.7 [I.V.:2739.3; IV Piggyback:54.4] Out: 1300 [Urine:1300] Intake/Output this shift: No intake/output data recorded.  PE: Gen:  Alert, NAD, pleasant HEENT: EOM's intact, pupils equal and round Pulm:  effort normal Abd: Soft, protuberant, appropriately tender, +BS, lap incisions cdi Psych: A&Ox3  Skin: no rashes noted, warm and dry  Lab Results:  Recent Labs    11/01/18 0342 11/02/18 0429  WBC 4.9 4.8  HGB 13.3 12.8*  HCT 41.8 40.8  PLT 134* 167   BMET Recent Labs    11/01/18 0342 11/02/18 0429  NA 140 138  K 3.4* 4.2  CL 104 104  CO2 29 27  GLUCOSE 129* 173*  BUN 21 13  CREATININE 1.34* 0.89  CALCIUM 8.1* 8.4*   PT/INR Recent Labs    11/02/18 0429  LABPROT 13.7  INR 1.06   CMP     Component Value Date/Time   NA 138 11/02/2018 0429   NA 141 07/31/2018 0746   K 4.2 11/02/2018 0429   CL 104 11/02/2018 0429   CO2 27 11/02/2018 0429   GLUCOSE 173 (H) 11/02/2018 0429   BUN 13 11/02/2018 0429   BUN 26 07/31/2018 0746   CREATININE 0.89 11/02/2018 0429   CREATININE 1.18 07/26/2016 0832   CALCIUM 8.4 (L) 11/02/2018 0429   PROT 5.7 (L) 11/02/2018 0429   PROT 6.0 06/25/2014 1121   ALBUMIN 2.7 (L) 11/02/2018 0429   ALBUMIN 4.2 06/25/2014 1121   AST 159 (H) 11/02/2018 0429   ALT 556 (H) 11/02/2018 0429   ALKPHOS 111  11/02/2018 0429   BILITOT 1.2 11/02/2018 0429   GFRNONAA >60 11/02/2018 0429   GFRAA >60 11/02/2018 0429   Lipase     Component Value Date/Time   LIPASE 27 10/30/2018 1024       Studies/Results: Mr 3d Recon At Scanner  Result Date: 11/01/2018 CLINICAL DATA:  Abnormal LFTs, cholelithiasis with possible acute cholecystitis on ultrasound EXAM: MRI ABDOMEN WITHOUT AND WITH CONTRAST (INCLUDING MRCP) TECHNIQUE: Multiplanar multisequence MR imaging of the abdomen was performed both before and after the administration of intravenous contrast. Heavily T2-weighted images of the biliary and pancreatic ducts were obtained, and three-dimensional MRCP images were rendered by post processing. CONTRAST:  10 mL Gadovist IV COMPARISON:  Right upper quadrant ultrasound dated 10/30/2018. CT abdomen/pelvis dated 10/11/2018. FINDINGS: Motion degraded images. Lower chest: Lung bases are essentially clear. Hepatobiliary: Mild geographic hepatic steatosis. No suspicious/enhancing hepatic lesions. Distended gallbladder with a dominant 18 mm gallstone and layering gallbladder sludge. Mild gallbladder wall thickening without surrounding inflammatory changes. This appearance is equivocal for acute cholecystitis and may be reactive to hepatic inflammatory changes given laboratory values. No intrahepatic or extrahepatic ductal dilatation. Common duct measures 4 mm. No choledocholithiasis is seen. Pancreas: Within normal limits. No peripancreatic inflammatory changes. Spleen:  Within normal limits. Adrenals/Urinary Tract:  Adrenal glands are within normal limits.  Numerous bilateral renal cysts, including a dominant left lateral interpolar cyst measuring 2.9 cm. No enhancing renal lesions. No hydronephrosis. Stomach/Bowel: Stomach is within normal limits. Visualized bowel is unremarkable. Vascular/Lymphatic:  No evidence of abdominal aortic aneurysm. No suspicious abdominal lymphadenopathy. Other:  No abdominal ascites.  Musculoskeletal: No focal osseous lesions. IMPRESSION: Cholelithiasis with mild gallbladder wall thickening and layering gallbladder sludge. The appearance is equivocal for acute cholecystitis and may be reactive to a hepatic inflammatory changes given laboratory values. No intrahepatic or extrahepatic ductal dilatation. Common duct measures 4 mm. No choledocholithiasis is seen. Mild geographic hepatic steatosis. Electronically Signed   By: Julian Hy M.D.   On: 11/01/2018 07:53   Mr Abdomen Mrcp Moise Boring Contast  Result Date: 11/01/2018 CLINICAL DATA:  Abnormal LFTs, cholelithiasis with possible acute cholecystitis on ultrasound EXAM: MRI ABDOMEN WITHOUT AND WITH CONTRAST (INCLUDING MRCP) TECHNIQUE: Multiplanar multisequence MR imaging of the abdomen was performed both before and after the administration of intravenous contrast. Heavily T2-weighted images of the biliary and pancreatic ducts were obtained, and three-dimensional MRCP images were rendered by post processing. CONTRAST:  10 mL Gadovist IV COMPARISON:  Right upper quadrant ultrasound dated 10/30/2018. CT abdomen/pelvis dated 10/11/2018. FINDINGS: Motion degraded images. Lower chest: Lung bases are essentially clear. Hepatobiliary: Mild geographic hepatic steatosis. No suspicious/enhancing hepatic lesions. Distended gallbladder with a dominant 18 mm gallstone and layering gallbladder sludge. Mild gallbladder wall thickening without surrounding inflammatory changes. This appearance is equivocal for acute cholecystitis and may be reactive to hepatic inflammatory changes given laboratory values. No intrahepatic or extrahepatic ductal dilatation. Common duct measures 4 mm. No choledocholithiasis is seen. Pancreas: Within normal limits. No peripancreatic inflammatory changes. Spleen:  Within normal limits. Adrenals/Urinary Tract:  Adrenal glands are within normal limits. Numerous bilateral renal cysts, including a dominant left lateral interpolar cyst  measuring 2.9 cm. No enhancing renal lesions. No hydronephrosis. Stomach/Bowel: Stomach is within normal limits. Visualized bowel is unremarkable. Vascular/Lymphatic:  No evidence of abdominal aortic aneurysm. No suspicious abdominal lymphadenopathy. Other:  No abdominal ascites. Musculoskeletal: No focal osseous lesions. IMPRESSION: Cholelithiasis with mild gallbladder wall thickening and layering gallbladder sludge. The appearance is equivocal for acute cholecystitis and may be reactive to a hepatic inflammatory changes given laboratory values. No intrahepatic or extrahepatic ductal dilatation. Common duct measures 4 mm. No choledocholithiasis is seen. Mild geographic hepatic steatosis. Electronically Signed   By: Julian Hy M.D.   On: 11/01/2018 07:53    Anti-infectives: Anti-infectives (From admission, onward)   Start     Dose/Rate Route Frequency Ordered Stop   11/01/18 2200  piperacillin-tazobactam (ZOSYN) IVPB 3.375 g     3.375 g 12.5 mL/hr over 240 Minutes Intravenous Every 8 hours 11/01/18 1639 11/02/18 1359   10/30/18 1400  piperacillin-tazobactam (ZOSYN) IVPB 3.375 g  Status:  Discontinued     3.375 g 12.5 mL/hr over 240 Minutes Intravenous Every 8 hours 10/30/18 1316 11/01/18 1639   10/30/18 1245  cefTRIAXone (ROCEPHIN) 2 g in sodium chloride 0.9 % 100 mL IVPB     2 g 200 mL/hr over 30 Minutes Intravenous  Once 10/30/18 1234 10/30/18 1338       Assessment/Plan AF - on chronic anitcoagulation - last Dose Xarelto PM 10/26/18 in PM Recent URI/Bronchitis  Gout Hypertension DM Nephrolithiasis - Hematuria - scheduled for [facility] Hyperlipidemia OSA - CPAP Obesity - BMI 41.5  Hyperbilirubinemia - resolved Acute cholecystitis S/p lap chole 11/13 Dr. Kae Heller - POD 1 - LFTs trending down, bilirubin WNL today -  tolerating clears, pain well controlled  ID - zosyn 11/11>>11/14 FEN - HH/CM diet VTE - SCDs Foley - none  Plan: Advance to HH/CM diet.  Ambulate this morning. If patient tolerates diet and pain is still well controlled after ambulating he is stable for discharge from surgical standpoint. He does not need any more antibiotics. Ok to resume xarelto tonight. He does not want any narcotics. F/u info and discharge instructions on AVS.   LOS: 3 days    Tyler Sparks , [facility] 11/02/2018, 8:45 AM Pager: [phone] Mon 7:00 am -11:30 AM Tues-Fri 7:00 am-4:30 pm Sat-Sun 7:00 am-11:30 am

## 2018-11-02 NOTE — Anesthesia Postprocedure Evaluation (Signed)
Anesthesia Post Note  Patient: Tyler Sparks  Procedure(s) Performed: LAPAROSCOPIC CHOLECYSTECTOMY (N/A )     Patient location during evaluation: PACU Anesthesia Type: General Level of consciousness: awake and alert Pain management: pain level controlled Vital Signs Assessment: post-procedure vital signs reviewed and stable Respiratory status: spontaneous breathing, nonlabored ventilation and respiratory function stable Cardiovascular status: blood pressure returned to baseline and stable Postop Assessment: no apparent nausea or vomiting Anesthetic complications: no    Last Vitals:  Vitals:   11/02/18 0536 11/02/18 0746  BP: (!) 154/97 (!) 166/83  Pulse: (!) 58 74  Resp: 18 18  Temp: 36.5 C   SpO2: 93% 97%    Last Pain:  Vitals:   11/02/18 1100  TempSrc:   PainSc: 0-No pain                 Lidia Collum

## 2018-11-02 NOTE — Discharge Summary (Signed)
Physician Discharge Summary  Tyler Sparks MWU:[id number] DOB: 1941/06/06 DOA: 10/30/2018  PCP: Velna Hatchet, MD  Admit date: 10/30/2018 Discharge date: 11/02/2018  Admitted From: Home Disposition: Home  Recommendations for Outpatient Follow-up:  1. Follow up with PCP in 1 week 2. Follow up with General surgery in 3 weeks 3. Please obtain CMP in one week to check LFTs 4. Please follow up on the following pending results: None  Home Health: None Equipment/Devices: None  Discharge Condition: Stable CODE STATUS: Full code Diet recommendation: Heart healthy   Brief/Interim Summary:  Admission HPI written by Merton Border, MD   HPI  Tyler Sparks  is a 77 y.o. male, with past medical history significant for A. fib on Xarelto, diabetes mellitus and hypertension presenting today with 1 day history of right upper quadrant pain associated with nausea but no vomiting.  Patient did not take anything p.o.  Patient reports fever and chills no diarrhea, no radiation of pain.  Work-up in the emergency room revealed cholecystitis by ultrasound.  Surgery was consulted and we are admitting the patient and holding Xarelto.  Patient started on IV antibiotics   Hospital course:  Acute cholecystitis Patient is s/p laparoscopic cholecystectomy on 11/13.  No ductal dilation on MRCP done on 11/12. Advanced to regular diet and tolerated well. Outpatient follow-up arranged. Will need repeat CMP in one week to ensure LFTs continue to improve.  Atrial fibrillation Rate controlled. Continued atenolol, Cardizem. Xarelto held secondary to surgery and continued on discharge.  Essential hypertension Continued atenolol, diltiazem. Lisinopril held secondary to acute kidney injury. Resumed on discharge.  Acute kidney injury Baseline creatinine of about 1.  Creatinine of 1.67 on admission down to 1.34. Resolved prior to discharge.  Hyperlipidemia Continued Lipitor  Discharge Diagnoses:    Principal Problem:   Cholecystitis Active Problems:   Atrial fibrillation (HCC)   Essential hypertension   AKI (acute kidney injury) ([place])   Hyperlipidemia    Discharge Instructions  Discharge Instructions    Call MD for:  severe uncontrolled pain   Complete by:  As directed    Call MD for:  temperature >100.4   Complete by:  As directed    Increase activity slowly   Complete by:  As directed      Allergies as of 11/02/2018      Reactions   Peanuts [peanut Oil] Anaphylaxis   Eggs Or Egg-derived Products Hives, Itching, Swelling      Medication List    STOP taking these medications   valsartan-hydrochlorothiazide 320-12.5 MG tablet Commonly known as:  DIOVAN-HCT     TAKE these medications   allopurinol 300 MG tablet Commonly known as:  ZYLOPRIM Take 300 mg by mouth at bedtime.   atenolol 25 MG tablet Commonly known as:  TENORMIN Take 25 mg by mouth daily.   atorvastatin 10 MG tablet Commonly known as:  LIPITOR Take 10 mg by mouth at bedtime.   co-enzyme Q-10 50 MG capsule Take 50 mg by mouth daily.   colchicine 0.6 MG tablet Take 0.6 mg by mouth daily as needed (FOR GOUT FLARES).   diltiazem 180 MG 24 hr capsule Commonly known as:  CARDIZEM CD Take 180 mg by mouth daily.   Fish Oil 1000 MG Caps Take 1,000 mg by mouth daily.   lisinopril 20 MG tablet Commonly known as:  PRINIVIL,ZESTRIL Take 20 mg by mouth daily.   Melatonin 3 MG Caps Take 3 mg by mouth at bedtime.   multivitamin tablet Take 1  tablet by mouth daily.   OSTEO BI-FLEX JOINT SHIELD PO Take 1 tablet by mouth 2 (two) times daily.   vitamin C 500 MG tablet Commonly known as:  ASCORBIC ACID Take 500 mg by mouth daily.   vitamin E 1000 UNIT capsule Take 1,000 Units by mouth daily.   XARELTO 20 MG Tabs tablet Generic drug:  rivaroxaban TAKE ONE TABLET BY MOUTH DAILY WITH SUPPER What changed:  See the new instructions.      Follow-up [facility],  [place]. Go on 11/20/2018.   Specialty:  General Surgery Why:  Your appointment is 12/02 at 8:30 am. Please arrive 30 minutes prior to your appointment to check in and fill out paperwork. Bring photo ID and insurance information. Contact information: [address] [place] [phone]       Velna Hatchet, MD. Schedule an appointment as soon as possible for a visit in 1 week(s).   Specialty:  Internal Medicine Why:  Hospital follow-up Contact information: [place] [address] [phone]        Burnell Blanks, MD .   Specialty:  Cardiology Contact information: [place]. [address] [place] [address] [phone]          Allergies  Allergen Reactions  . Peanuts [Peanut Oil] Anaphylaxis  . Eggs Or Egg-Derived Products Hives, Itching and Swelling    Consultations:  General surgery   Procedures/Studies: Dg Chest 2 View  Result Date: 10/30/2018 CLINICAL DATA:  Upper abdominal pain EXAM: CHEST - 2 VIEW COMPARISON:  None. FINDINGS: Cardiomegaly. No confluent airspace opacities, effusions or edema. No acute bony abnormality. IMPRESSION: Cardiomegaly.  No active disease. Electronically Signed   By: Rolm Baptise M.D.   On: 10/30/2018 11:37   Mr 3d Recon At Scanner  Result Date: 11/01/2018 CLINICAL DATA:  Abnormal LFTs, cholelithiasis with possible acute cholecystitis on ultrasound EXAM: MRI ABDOMEN WITHOUT AND WITH CONTRAST (INCLUDING MRCP) TECHNIQUE: Multiplanar multisequence MR imaging of the abdomen was performed both before and after the administration of intravenous contrast. Heavily T2-weighted images of the biliary and pancreatic ducts were obtained, and three-dimensional MRCP images were rendered by post processing. CONTRAST:  10 mL Gadovist IV COMPARISON:  Right upper quadrant ultrasound dated 10/30/2018. CT abdomen/pelvis dated 10/11/2018. FINDINGS: Motion degraded images. Lower chest:  Lung bases are essentially clear. Hepatobiliary: Mild geographic hepatic steatosis. No suspicious/enhancing hepatic lesions. Distended gallbladder with a dominant 18 mm gallstone and layering gallbladder sludge. Mild gallbladder wall thickening without surrounding inflammatory changes. This appearance is equivocal for acute cholecystitis and may be reactive to hepatic inflammatory changes given laboratory values. No intrahepatic or extrahepatic ductal dilatation. Common duct measures 4 mm. No choledocholithiasis is seen. Pancreas: Within normal limits. No peripancreatic inflammatory changes. Spleen:  Within normal limits. Adrenals/Urinary Tract:  Adrenal glands are within normal limits. Numerous bilateral renal cysts, including a dominant left lateral interpolar cyst measuring 2.9 cm. No enhancing renal lesions. No hydronephrosis. Stomach/Bowel: Stomach is within normal limits. Visualized bowel is unremarkable. Vascular/Lymphatic:  No evidence of abdominal aortic aneurysm. No suspicious abdominal lymphadenopathy. Other:  No abdominal ascites. Musculoskeletal: No focal osseous lesions. IMPRESSION: Cholelithiasis with mild gallbladder wall thickening and layering gallbladder sludge. The appearance is equivocal for acute cholecystitis and may be reactive to a hepatic inflammatory changes given laboratory values. No intrahepatic or extrahepatic ductal dilatation. Common duct measures 4 mm. No choledocholithiasis is seen. Mild geographic hepatic steatosis. Electronically Signed   By:  Julian Hy M.D.   On: 11/01/2018 07:53   Mr Abdomen Mrcp Moise Boring Contast  Result Date: 11/01/2018 CLINICAL DATA:  Abnormal LFTs, cholelithiasis with possible acute cholecystitis on ultrasound EXAM: MRI ABDOMEN WITHOUT AND WITH CONTRAST (INCLUDING MRCP) TECHNIQUE: Multiplanar multisequence MR imaging of the abdomen was performed both before and after the administration of intravenous contrast. Heavily T2-weighted images of the  biliary and pancreatic ducts were obtained, and three-dimensional MRCP images were rendered by post processing. CONTRAST:  10 mL Gadovist IV COMPARISON:  Right upper quadrant ultrasound dated 10/30/2018. CT abdomen/pelvis dated 10/11/2018. FINDINGS: Motion degraded images. Lower chest: Lung bases are essentially clear. Hepatobiliary: Mild geographic hepatic steatosis. No suspicious/enhancing hepatic lesions. Distended gallbladder with a dominant 18 mm gallstone and layering gallbladder sludge. Mild gallbladder wall thickening without surrounding inflammatory changes. This appearance is equivocal for acute cholecystitis and may be reactive to hepatic inflammatory changes given laboratory values. No intrahepatic or extrahepatic ductal dilatation. Common duct measures 4 mm. No choledocholithiasis is seen. Pancreas: Within normal limits. No peripancreatic inflammatory changes. Spleen:  Within normal limits. Adrenals/Urinary Tract:  Adrenal glands are within normal limits. Numerous bilateral renal cysts, including a dominant left lateral interpolar cyst measuring 2.9 cm. No enhancing renal lesions. No hydronephrosis. Stomach/Bowel: Stomach is within normal limits. Visualized bowel is unremarkable. Vascular/Lymphatic:  No evidence of abdominal aortic aneurysm. No suspicious abdominal lymphadenopathy. Other:  No abdominal ascites. Musculoskeletal: No focal osseous lesions. IMPRESSION: Cholelithiasis with mild gallbladder wall thickening and layering gallbladder sludge. The appearance is equivocal for acute cholecystitis and may be reactive to a hepatic inflammatory changes given laboratory values. No intrahepatic or extrahepatic ductal dilatation. Common duct measures 4 mm. No choledocholithiasis is seen. Mild geographic hepatic steatosis. Electronically Signed   By: Julian Hy M.D.   On: 11/01/2018 07:53   US Abdomen Limited Ruq  Result Date: 10/30/2018 CLINICAL DATA:  Upper abdominal pain EXAM: ULTRASOUND  ABDOMEN LIMITED RIGHT UPPER QUADRANT COMPARISON:  CT abdomen 10/11/2018 FINDINGS: Gallbladder: Cholelithiasis. Largest gallstone 2.5 cm. Associated sludge. Gallbladder wall thickening 8.2 mm. Negative sonographic Murphy sign Common bile duct: Diameter: 3.1 mm Liver: Heterogeneous increased echogenicity liver without focal lesion. Portal vein is patent on color Doppler imaging with normal direction of blood flow towards the liver. IMPRESSION: Cholelithiasis with gallbladder wall thickening suggestive of cholecystitis. However, no acute pain over the gallbladder. Hepatic steatosis Electronically Signed   By: Franchot Gallo M.D.   On: 10/30/2018 11:36      Subjective: No abdominal pain. No nausea. Tolerated breakfast.  Discharge Exam: Vitals:   11/02/18 0536 11/02/18 0746  BP: (!) 154/97 (!) 166/83  Pulse: (!) 58 74  Resp: 18 18  Temp: 97.7 F (36.5 C)   SpO2: 93% 97%   Vitals:   11/01/18 1603 11/01/18 2027 11/02/18 0536 11/02/18 0746  BP: (!) 147/90 (!) 158/95 (!) 154/97 (!) 166/83  Pulse: (!) 54 74 (!) 58 74  Resp: 18 20 18 18   Temp: (!) 97.5 F (36.4 C) 97.7 F (36.5 C) 97.7 F (36.5 C)   TempSrc: Oral Oral Oral   SpO2: 94% 97% 93% 97%  Weight:      Height:        General: Pt is alert, awake, not in acute distress Cardiovascular: RRR, S1/S2 +, no rubs, no gallops Respiratory: CTA bilaterally, no wheezing, no rhonchi Abdominal: Soft, NT, ND, bowel sounds + Extremities: no edema, no cyanosis    The results of significant diagnostics from this hospitalization (including imaging, microbiology, ancillary  and laboratory) are listed below for reference.     Microbiology: Recent Results (from the past 240 hour(s))  Surgical pcr screen     Status: None   Collection Time: 10/31/18 11:37 PM  Result Value Ref Range Status   MRSA, PCR NEGATIVE NEGATIVE Final   Staphylococcus aureus NEGATIVE NEGATIVE Final    Comment: (NOTE) The Xpert SA Assay (FDA approved for NASAL specimens  in patients 56 years of age and older), is one component of a comprehensive surveillance program. It is not intended to diagnose infection nor to guide or monitor treatment. Performed at [facility], [place] [address] [place], [place] [address]      Labs: BNP (last 3 results) No results for input(s): BNP in the last 8760 hours. Basic Metabolic Panel: Recent Labs  Lab 10/30/18 1024 10/31/18 0534 11/01/18 0342 11/02/18 0429  NA 137 133* 140 138  K 3.0* 2.8* 3.4* 4.2  CL 101 98 104 104  CO2 26 27 29 27   GLUCOSE 146* 298* 129* 173*  BUN 15 20 21 13   CREATININE 0.87 1.67* 1.34* 0.89  CALCIUM 8.6* 7.5* 8.1* 8.4*  MG  --   --  1.9  --    Liver Function Tests: Recent Labs  Lab 10/30/18 1024 10/31/18 0534 11/01/18 0342 11/02/18 0429  AST 401* 908* 427* 159*  ALT 275* 1,204* 818* 556*  ALKPHOS 130* 132* 127* 111  BILITOT 1.7* 4.6* 2.1* 1.2  PROT 6.7 5.3* 5.9* 5.7*  ALBUMIN 3.7 2.7* 2.9* 2.7*   Recent Labs  Lab 10/30/18 1024  LIPASE 27   No results for input(s): AMMONIA in the last 168 hours. CBC: Recent Labs  Lab 10/30/18 1024 10/31/18 0534 11/01/18 0342 11/02/18 0429  WBC 5.2 7.7 4.9 4.8  NEUTROABS 4.1  --  3.4  --   HGB 15.0 11.8* 13.3 12.8*  HCT 47.0 35.6* 41.8 40.8  MCV 93.1 93.7 93.5 94.9  PLT 172 126* 134* 167   Cardiac Enzymes: Recent Labs  Lab 10/30/18 1024  TROPONINI <0.03   BNP: Invalid input(s): POCBNP CBG: Recent Labs  Lab 11/01/18 1414 11/01/18 1603 11/01/18 2021 11/02/18 0745 11/02/18 1122  GLUCAP 122* 113* 215* 169* 150*   D-Dimer No results for input(s): DDIMER in the last 72 hours. Hgb A1c No results for input(s): HGBA1C in the last 72 hours. Lipid Profile No results for input(s): CHOL, HDL, LDLCALC, TRIG, CHOLHDL, LDLDIRECT in the last 72 hours. Thyroid function studies No results for input(s): TSH, T4TOTAL, T3FREE, THYROIDAB in the last 72 hours.  Invalid input(s): FREET3 Anemia work up No results for input(s):  VITAMINB12, FOLATE, FERRITIN, TIBC, IRON, RETICCTPCT in the last 72 hours. Urinalysis    Component Value Date/Time   COLORURINE YELLOW 10/30/2018 1100   APPEARANCEUR CLEAR 10/30/2018 1100   LABSPEC 1.013 10/30/2018 1100   PHURINE 7.0 10/30/2018 1100   GLUCOSEU 50 (A) 10/30/2018 1100   HGBUR SMALL (A) 10/30/2018 1100   BILIRUBINUR NEGATIVE 10/30/2018 1100   KETONESUR NEGATIVE 10/30/2018 1100   PROTEINUR NEGATIVE 10/30/2018 1100   NITRITE NEGATIVE 10/30/2018 1100   LEUKOCYTESUR NEGATIVE 10/30/2018 1100   Sepsis Labs Invalid input(s): PROCALCITONIN,  WBC,  LACTICIDVEN Microbiology Recent Results (from the past 240 hour(s))  Surgical pcr screen     Status: None   Collection Time: 10/31/18 11:37 PM  Result Value Ref Range Status   MRSA, PCR NEGATIVE NEGATIVE Final   Staphylococcus aureus NEGATIVE NEGATIVE Final    Comment: (NOTE) The Xpert SA Assay (FDA  approved for NASAL specimens in patients 50 years of age and older), is one component of a comprehensive surveillance program. It is not intended to diagnose infection nor to guide or monitor treatment. Performed at [facility], [place] [address] [place], [place] [address]      SIGNED:   Cordelia Poche, MD [facility] 11/02/2018, 1:42 PM

## 2018-11-02 NOTE — Progress Notes (Signed)
Patient discharged to home, AVS reviewed, IV removed, telebox returned. Patient and his wife refused wheelchair/ assistance to exit.

## 2018-11-02 NOTE — Care Management Important Message (Signed)
Important Message  Patient Details  Name: Tyler Sparks MRN: [id number] Date of Birth: [birth year]   Medicare Important Message Given:  Yes    [name] 11/02/2018, 3:10 PM

## 2018-11-20 ENCOUNTER — Ambulatory Visit: Payer: Medicare Other | Admitting: Cardiovascular Disease

## 2018-11-20 ENCOUNTER — Encounter: Payer: Self-pay | Admitting: Cardiovascular Disease

## 2018-11-20 VITALS — BP 142/86 | HR 66 | Ht 67.0 in | Wt 265.5 lb

## 2018-11-20 DIAGNOSIS — I10 Essential (primary) hypertension: Secondary | ICD-10-CM

## 2018-11-20 DIAGNOSIS — I4819 Other persistent atrial fibrillation: Secondary | ICD-10-CM | POA: Diagnosis not present

## 2018-11-20 DIAGNOSIS — I712 Thoracic aortic aneurysm, without rupture, unspecified: Secondary | ICD-10-CM

## 2018-11-20 NOTE — Progress Notes (Signed)
Chief Complaint  Patient presents with  . Follow-up    atrial fibrillation   History of Present Illness: 77 yo male with history of paroxysmal atrial fibrillation, HTN, HLD, gout, OSA and thoracic aortic aneurysm here today for cardiac follow up. I met him in July 2015. He had onset of atrial fib with RVR in 2008 and was converted to sinus with IV diltiazem. He had maintained sinus on Cardizem and atenolol but had not been on long term anticoagulation. I saw him 08/18/15 and he was in atrial fib. Xarelto was started. Echo 10/15/15 with normal LV function, no valve disease. Plan was for rate control and anti-coagulation. He is known to have a mildly dilated aortic root, 4.2 cm by CTA August 2018.   He is here today for follow up. The patient denies any chest pain, dyspnea, palpitations, lower extremity edema, orthopnea, PND, dizziness, near syncope or syncope.   Primary Care Physician: Velna Hatchet, MD   Past Medical History:  Diagnosis Date  . Atrial fibrillation ([place])    2008  . Cancer ([place])    skin  . Cholecystitis 10/2018  . Gout   . HTN (hypertension)   . Hyperlipidemia   . Nephrolithiasis   . Peripheral neuropathy   . Sleep apnea     Past Surgical History:  Procedure Laterality Date  . ARM SKIN LESION BIOPSY / EXCISION Right 8 15  . CHOLECYSTECTOMY N/A 11/01/2018   Procedure: LAPAROSCOPIC CHOLECYSTECTOMY;  Surgeon: Clovis Riley, MD;  Location: [facility];  Service: General;  Laterality: N/A;  . [place]  . SKIN CANCER EXCISION  2008, 07/2014  . TONSILECTOMY/ADENOIDECTOMY WITH MYRINGOTOMY  1950  . WISDOM TOOTH EXTRACTION      Current Outpatient Medications  Medication Sig Dispense Refill  . allopurinol (ZYLOPRIM) 300 MG tablet Take 300 mg by mouth at bedtime.     Marland Kitchen atenolol (TENORMIN) 25 MG tablet Take 25 mg by mouth daily.    Marland Kitchen atorvastatin (LIPITOR) 10 MG tablet Take 10 mg by mouth at bedtime.     Marland Kitchen co-enzyme Q-10 50 MG capsule Take 50 mg by mouth  daily.    . colchicine 0.6 MG tablet Take 0.6 mg by mouth daily as needed (FOR GOUT FLARES).     Marland Kitchen diltiazem (CARDIZEM CD) 180 MG 24 hr capsule Take 180 mg by mouth daily.    . Melatonin 3 MG CAPS Take 3 mg by mouth at bedtime.     . Misc Natural Products (OSTEO BI-FLEX JOINT SHIELD PO) Take 1 tablet by mouth 2 (two) times daily.     . Multiple Vitamin (MULTIVITAMIN) tablet Take 1 tablet by mouth daily.    . Omega-3 Fatty Acids (FISH OIL) 1000 MG CAPS Take 1,000 mg by mouth daily.     . valsartan-hydrochlorothiazide (DIOVAN-HCT) 320-12.5 MG tablet Take 1 tablet by mouth daily.    . vitamin C (ASCORBIC ACID) 500 MG tablet Take 500 mg by mouth daily.    . vitamin E 1000 UNIT capsule Take 1,000 Units by mouth daily.    Alveda Reasons 20 MG TABS tablet TAKE ONE TABLET BY MOUTH DAILY WITH SUPPER (Patient taking differently: Take 20 mg by mouth daily with supper. ) 90 tablet 2   No current facility-administered medications for this visit.     Allergies  Allergen Reactions  . Peanuts [Peanut Oil] Anaphylaxis  . Eggs Or Egg-Derived Products Hives, Itching and Swelling    Social History   Socioeconomic History  .  Marital status: Married    Spouse name: Not on file  . Number of children: 2  . Years of education: Not on file  . Highest education level: Not on file  Occupational History  . Occupation: Retired-Linguistics Professor [employer]  Social Needs  . Financial resource strain: Not on file  . Food insecurity:    Worry: Not on file    Inability: Not on file  . Transportation needs:    Medical: Not on file    Non-medical: Not on file  Tobacco Use  . Smoking status: Former Smoker    Last attempt to quit: 12/26/1973    Years since quitting: 44.9  . Smokeless tobacco: Never Used  Substance and Sexual Activity  . Alcohol use: Yes    Alcohol/week: 14.0 standard drinks    Types: 14 Standard drinks or equivalent per week    Comment: OCCASIONAL  . Drug use: No  . Sexual activity: Not on file    Lifestyle  . Physical activity:    Days per week: Not on file    Minutes per session: Not on file  . Stress: Not on file  Relationships  . Social connections:    Talks on phone: Not on file    Gets together: Not on file    Attends religious service: Not on file    Active member of club or organization: Not on file    Attends meetings of clubs or organizations: Not on file    Relationship status: Not on file  . Intimate partner violence:    Fear of current or ex partner: Not on file    Emotionally abused: Not on file    Physically abused: Not on file    Forced sexual activity: Not on file  Other Topics Concern  . Not on file  Social History Narrative  . Not on file    Family History  Problem Relation Age of Onset  . Heart attack Father   . Heart attack Mother   . Colon cancer Neg Hx   . Esophageal cancer Neg Hx   . Rectal cancer Neg Hx   . Stomach cancer Neg Hx     Review of Systems:  As stated in the HPI and otherwise negative.   BP (!) 142/86   Pulse 66   Ht _0  (1.702 m)   Wt 265 lb 8 oz (120.4 kg)   SpO2 95%   BMI 41.58 kg/m   Physical Examination:  General: Well developed, well nourished, NAD  HEENT: OP clear, mucus membranes moist  SKIN: warm, dry. No rashes. Neuro: No focal deficits  Musculoskeletal: Muscle strength 5/5 all ext  Psychiatric: Mood and affect normal  Neck: No JVD, no carotid bruits, no thyromegaly, no lymphadenopathy.  Lungs:Clear bilaterally, no wheezes, rhonci, crackles Cardiovascular: Irreg irreg. No murmurs, gallops or rubs. Abdomen:Soft. Bowel sounds present. Non-tender.  Extremities: No lower extremity edema. Pulses are 2 + in the bilateral DP/PT.  EKG:  EKG is not ordered today. The ekg ordered today demonstrates   Recent Labs: 11/01/2018: Magnesium 1.9 11/02/2018: ALT 556; BUN 13; Creatinine, Ser 0.89; Hemoglobin 12.8; Platelets 167; Potassium 4.2; Sodium 138    Wt Readings from Last 3 Encounters:  11/20/18 265 lb 8 oz  (120.4 kg)  11/01/18 270 lb (122.5 kg)  11/17/17 275 lb (124.7 kg)     Other studies Reviewed: Additional studies/ records that were reviewed today include: . Review of the above records demonstrates:   Assessment and Plan:  1. Persistent atrial fibrillation: He is in atrial fib today. Rate is controlled. Continue atenolol and Cardizem. Continue Xarelto.   2. Thoracic aortic aneurysm (ascending aorta): Mild dilation of ascending aorta by CTA August 2019. Repeat in August 2020.    3. Coronary artery calcification: Noted on CTA chest. He has no symptoms c/w angina and is very active. LIkely that this represents calcification of his artery wall only without significant luminal obstruction. No plans for ischemic workup at this time.   4. HTN: BP is controlled on Valsartan/HCTZ. He has stopped taking Lisinopril.   Current medicines are reviewed at length with the patient today.  The patient does not have concerns regarding medicines.  The following changes have been made:   Labs/ tests ordered today include  Orders Placed This Encounter  Procedures  . CT ANGIO CHEST AORTA W &/OR WO CONTRAST  . Basic Metabolic Panel (BMET)    Disposition:   FU with me in 12 months   Signed, Lauree Chandler, MD 11/20/2018 8:51 AM    [facility] [place], [place], [place]  [address] Phone: [phone]; Fax: [phone]

## 2018-11-20 NOTE — Patient Instructions (Signed)
Medication Instructions:  Your physician recommends that you continue on your current medications as directed. Please refer to the Current Medication list given to you today.  If you need a refill on your cardiac medications before your next appointment, please call your pharmacy.   Lab work: Your physician recommends that you return for lab work in: August 2020--BMP --week or so prior to CT scan  If you have labs (blood work) drawn today and your tests are completely normal, you will receive your results only by: Marland Kitchen MyChart Message (if you have MyChart) OR . A paper copy in the mail If you have any lab test that is abnormal or we need to change your treatment, we will call you to review the results.  Testing/Procedures: Non-Cardiac CT scanning, (CAT scanning), is a noninvasive, special x-ray that produces cross-sectional images of the body using x-rays and a computer. CT scans help physicians diagnose and treat medical conditions. For some CT exams, a contrast material is used to enhance visibility in the area of the body being studied. CT scans provide greater clarity and reveal more details than regular x-ray exams.  To be done in August 2020    Follow-Up: At [facility], you and your health needs are our priority.  As part of our continuing mission to provide you with exceptional heart care, we have created designated Provider Care Teams.  These Care Teams include your primary Cardiologist (physician) and Advanced Practice Providers (APPs -  Physician Assistants and Nurse Practitioners) who all work together to provide you with the care you need, when you need it. You will need a follow up appointment in 12 months.  Please call our office 2 months in advance to schedule this appointment.  You may see Lauree Chandler, MD or one of the following Advanced Practice Providers on your designated Care Team:   [place], PA-C Melina Copa, PA-C . Ermalinda Barrios, PA-C  Any Other  Special Instructions Will Be Listed Below (If Applicable).

## 2019-02-03 ENCOUNTER — Other Ambulatory Visit: Payer: Self-pay | Admitting: Cardiovascular Disease

## 2019-02-05 NOTE — Telephone Encounter (Signed)
Pt last saw Dr Angelena Form 11/20/18, last labs 11/02/18 Creat 0.89, age 78, weight 120.4kg, CrCl 120.25, based on CrCl pt is on appropriate dosage of Xarelto 20mg  QD.  Will refill rx.

## 2019-07-31 ENCOUNTER — Other Ambulatory Visit: Payer: Self-pay | Admitting: Cardiovascular Disease

## 2019-07-31 NOTE — Telephone Encounter (Signed)
Prescription refill request for Xarelto received.   Last office visit: Dr. Angelena Form (11-20-2018) Weight: 120.4 kg (11-20-2018) Age: 78 yo Scr: 0.89 (11-02-2018) CrCl: 118 ml/min  Prescription refill sent.

## 2019-08-01 ENCOUNTER — Other Ambulatory Visit: Payer: Self-pay

## 2019-08-01 ENCOUNTER — Other Ambulatory Visit: Payer: Medicare Other | Admitting: *Deleted

## 2019-08-01 DIAGNOSIS — I712 Thoracic aortic aneurysm, without rupture, unspecified: Secondary | ICD-10-CM

## 2019-08-01 LAB — BASIC METABOLIC PANEL
BUN/Creatinine Ratio: 19 (ref 10–24)
BUN: 21 mg/dL (ref 8–27)
CO2: 25 mmol/L (ref 20–29)
Calcium: 9.1 mg/dL (ref 8.6–10.2)
Chloride: 101 mmol/L (ref 96–106)
Creatinine, Ser: 1.09 mg/dL (ref 0.76–1.27)
GFR calc Af Amer: 75 mL/min/{1.73_m2} (ref >59)
GFR calc non Af Amer: 65 mL/min/{1.73_m2} (ref >59)
Glucose: 113 mg/dL — ABNORMAL HIGH (ref 65–99)
Potassium: 3.5 mmol/L (ref 3.5–5.2)
Sodium: 143 mmol/L (ref 134–144)

## 2019-08-07 ENCOUNTER — Other Ambulatory Visit: Payer: Medicare Other

## 2019-08-08 ENCOUNTER — Other Ambulatory Visit: Payer: Self-pay

## 2019-08-08 ENCOUNTER — Ambulatory Visit (INDEPENDENT_AMBULATORY_CARE_PROVIDER_SITE_OTHER)
Admission: RE | Admit: 2019-08-08 | Discharge: 2019-08-08 | Disposition: A | Discharge status: Home / Self Care | Payer: Medicare Other | Source: Ambulatory Visit | Attending: Cardiovascular Disease | Admitting: Cardiovascular Disease

## 2019-08-08 DIAGNOSIS — I712 Thoracic aortic aneurysm, without rupture, unspecified: Secondary | ICD-10-CM

## 2019-08-08 MED ORDER — IOHEXOL 350 MG/ML SOLN
100.0000 mL | Freq: Once | INTRAVENOUS | Status: AC | PRN
  Administered 2019-08-08: 09:00:00 100 mL via INTRAVENOUS

## 2019-08-13 DIAGNOSIS — G4700 Insomnia, unspecified: Secondary | ICD-10-CM | POA: Insufficient documentation

## 2019-10-27 ENCOUNTER — Emergency Department (HOSPITAL_COMMUNITY): Payer: Medicare Other

## 2019-10-27 ENCOUNTER — Other Ambulatory Visit: Payer: Self-pay

## 2019-10-27 ENCOUNTER — Encounter (HOSPITAL_COMMUNITY): Payer: Self-pay

## 2019-10-27 ENCOUNTER — Inpatient Hospital Stay (HOSPITAL_COMMUNITY)
Admission: EM | Admit: 2019-10-27 | Discharge: 2019-10-29 | DRG: 065 | Disposition: A | Discharge status: Home / Self Care | Payer: Medicare Other | Attending: Internal Medicine | Admitting: Internal Medicine

## 2019-10-27 DIAGNOSIS — W010XXA Fall on same level from slipping, tripping and stumbling without subsequent striking against object, initial encounter: Secondary | ICD-10-CM | POA: Diagnosis present

## 2019-10-27 DIAGNOSIS — Z713 Dietary counseling and surveillance: Secondary | ICD-10-CM | POA: Diagnosis not present

## 2019-10-27 DIAGNOSIS — Z79899 Other long term (current) drug therapy: Secondary | ICD-10-CM | POA: Diagnosis not present

## 2019-10-27 DIAGNOSIS — N1831 Chronic kidney disease, stage 3a: Secondary | ICD-10-CM | POA: Diagnosis present

## 2019-10-27 DIAGNOSIS — I6340 Cerebral infarction due to embolism of unspecified cerebral artery: Secondary | ICD-10-CM | POA: Diagnosis present

## 2019-10-27 DIAGNOSIS — I639 Cerebral infarction, unspecified: Secondary | ICD-10-CM | POA: Diagnosis not present

## 2019-10-27 DIAGNOSIS — Z9049 Acquired absence of other specified parts of digestive tract: Secondary | ICD-10-CM | POA: Diagnosis not present

## 2019-10-27 DIAGNOSIS — I340 Nonrheumatic mitral (valve) insufficiency: Secondary | ICD-10-CM | POA: Diagnosis not present

## 2019-10-27 DIAGNOSIS — E876 Hypokalemia: Secondary | ICD-10-CM | POA: Diagnosis present

## 2019-10-27 DIAGNOSIS — E1122 Type 2 diabetes mellitus with diabetic chronic kidney disease: Secondary | ICD-10-CM | POA: Diagnosis present

## 2019-10-27 DIAGNOSIS — Z85828 Personal history of other malignant neoplasm of skin: Secondary | ICD-10-CM

## 2019-10-27 DIAGNOSIS — Z91010 Allergy to peanuts: Secondary | ICD-10-CM | POA: Diagnosis not present

## 2019-10-27 DIAGNOSIS — Z20828 Contact with and (suspected) exposure to other viral communicable diseases: Secondary | ICD-10-CM | POA: Diagnosis present

## 2019-10-27 DIAGNOSIS — G4733 Obstructive sleep apnea (adult) (pediatric): Secondary | ICD-10-CM | POA: Diagnosis present

## 2019-10-27 DIAGNOSIS — I4819 Other persistent atrial fibrillation: Secondary | ICD-10-CM | POA: Diagnosis present

## 2019-10-27 DIAGNOSIS — E1142 Type 2 diabetes mellitus with diabetic polyneuropathy: Secondary | ICD-10-CM | POA: Diagnosis present

## 2019-10-27 DIAGNOSIS — I4892 Unspecified atrial flutter: Secondary | ICD-10-CM | POA: Diagnosis present

## 2019-10-27 DIAGNOSIS — M109 Gout, unspecified: Secondary | ICD-10-CM | POA: Diagnosis present

## 2019-10-27 DIAGNOSIS — Z7901 Long term (current) use of anticoagulants: Secondary | ICD-10-CM

## 2019-10-27 DIAGNOSIS — I129 Hypertensive chronic kidney disease with stage 1 through stage 4 chronic kidney disease, or unspecified chronic kidney disease: Secondary | ICD-10-CM | POA: Diagnosis present

## 2019-10-27 DIAGNOSIS — E669 Obesity, unspecified: Secondary | ICD-10-CM | POA: Diagnosis present

## 2019-10-27 DIAGNOSIS — Z87891 Personal history of nicotine dependence: Secondary | ICD-10-CM

## 2019-10-27 DIAGNOSIS — E785 Hyperlipidemia, unspecified: Secondary | ICD-10-CM | POA: Diagnosis present

## 2019-10-27 DIAGNOSIS — I4891 Unspecified atrial fibrillation: Secondary | ICD-10-CM

## 2019-10-27 DIAGNOSIS — Z6837 Body mass index (BMI) 37.0-37.9, adult: Secondary | ICD-10-CM

## 2019-10-27 DIAGNOSIS — Z91012 Allergy to eggs: Secondary | ICD-10-CM

## 2019-10-27 DIAGNOSIS — Z8249 Family history of ischemic heart disease and other diseases of the circulatory system: Secondary | ICD-10-CM

## 2019-10-27 DIAGNOSIS — W19XXXA Unspecified fall, initial encounter: Secondary | ICD-10-CM

## 2019-10-27 DIAGNOSIS — I10 Essential (primary) hypertension: Secondary | ICD-10-CM | POA: Diagnosis present

## 2019-10-27 DIAGNOSIS — Z7289 Other problems related to lifestyle: Secondary | ICD-10-CM

## 2019-10-27 DIAGNOSIS — I361 Nonrheumatic tricuspid (valve) insufficiency: Secondary | ICD-10-CM | POA: Diagnosis not present

## 2019-10-27 LAB — URINALYSIS, ROUTINE W REFLEX MICROSCOPIC
Bilirubin Urine: NEGATIVE
Glucose, UA: NEGATIVE mg/dL
Hgb urine dipstick: NEGATIVE
Ketones, ur: 5 mg/dL — AB
Leukocytes,Ua: NEGATIVE
Nitrite: NEGATIVE
Protein, ur: NEGATIVE mg/dL
Specific Gravity, Urine: 1.016 (ref 1.005–1.030)
pH: 7 (ref 5.0–8.0)

## 2019-10-27 LAB — BASIC METABOLIC PANEL
Anion gap: 11 (ref 5–15)
BUN: 27 mg/dL — ABNORMAL HIGH (ref 8–23)
CO2: 26 mmol/L (ref 22–32)
Calcium: 9.4 mg/dL (ref 8.9–10.3)
Chloride: 105 mmol/L (ref 98–111)
Creatinine, Ser: 1.36 mg/dL — ABNORMAL HIGH (ref 0.61–1.24)
GFR calc Af Amer: 58 mL/min — ABNORMAL LOW (ref >60)
GFR calc non Af Amer: 50 mL/min — ABNORMAL LOW (ref >60)
Glucose, Bld: 149 mg/dL — ABNORMAL HIGH (ref 70–99)
Potassium: 3.4 mmol/L — ABNORMAL LOW (ref 3.5–5.1)
Sodium: 142 mmol/L (ref 135–145)

## 2019-10-27 LAB — CBC
HCT: 43.6 % (ref 39.0–52.0)
Hemoglobin: 14.8 g/dL (ref 13.0–17.0)
MCH: 32.5 pg (ref 26.0–34.0)
MCHC: 33.9 g/dL (ref 30.0–36.0)
MCV: 95.6 fL (ref 80.0–100.0)
Platelets: 172 10*3/uL (ref 150–400)
RBC: 4.56 MIL/uL (ref 4.22–5.81)
RDW: 13.4 % (ref 11.5–15.5)
WBC: 3.4 10*3/uL — ABNORMAL LOW (ref 4.0–10.5)
nRBC: 0 % (ref 0.0–0.2)

## 2019-10-27 LAB — CK: Total CK: 85 U/L (ref 49–397)

## 2019-10-27 LAB — TROPONIN I (HIGH SENSITIVITY)
Troponin I (High Sensitivity): 8 ng/L (ref <18)
Troponin I (High Sensitivity): 9 ng/L (ref <18)

## 2019-10-27 LAB — BRAIN NATRIURETIC PEPTIDE: B Natriuretic Peptide: 84.1 pg/mL (ref 0.0–100.0)

## 2019-10-27 MED ORDER — ACETAMINOPHEN 160 MG/5ML PO SOLN: 650.0000 mg | ORAL | Status: DC | PRN

## 2019-10-27 MED ORDER — RIVAROXABAN 20 MG PO TABS
20.0000 mg | ORAL_TABLET | Freq: Every day | ORAL | Status: DC
  Administered 2019-10-28 (×2): 20 mg via ORAL
  Filled 2019-10-27 (×2): qty 1

## 2019-10-27 MED ORDER — ATORVASTATIN CALCIUM 10 MG PO TABS
10.0000 mg | ORAL_TABLET | Freq: Every day | ORAL | Status: DC
  Administered 2019-10-28 (×2): 10 mg via ORAL
  Filled 2019-10-27 (×2): qty 1

## 2019-10-27 MED ORDER — SENNOSIDES-DOCUSATE SODIUM 8.6-50 MG PO TABS: 1.0000 | ORAL_TABLET | Freq: Every evening | ORAL | Status: DC | PRN

## 2019-10-27 MED ORDER — ACETAMINOPHEN 650 MG RE SUPP: 650.0000 mg | RECTAL | Status: DC | PRN

## 2019-10-27 MED ORDER — CO-ENZYME Q-10 50 MG PO CAPS: 50.0000 mg | ORAL_CAPSULE | Freq: Every day | ORAL | Status: DC

## 2019-10-27 MED ORDER — IRBESARTAN 300 MG PO TABS
300.0000 mg | ORAL_TABLET | Freq: Every day | ORAL | Status: DC
  Administered 2019-10-28: 09:00:00 300 mg via ORAL
  Filled 2019-10-27: qty 1

## 2019-10-27 MED ORDER — SODIUM CHLORIDE 0.9 % IV SOLN
INTRAVENOUS | Status: DC
  Administered 2019-10-28: 01:00:00 via INTRAVENOUS

## 2019-10-27 MED ORDER — COLCHICINE 0.6 MG PO TABS: 0.6000 mg | ORAL_TABLET | Freq: Every day | ORAL | Status: DC | PRN

## 2019-10-27 MED ORDER — VITAMIN C 500 MG PO TABS
500.0000 mg | ORAL_TABLET | Freq: Every day | ORAL | Status: DC
  Administered 2019-10-28 – 2019-10-29 (×2): 500 mg via ORAL
  Filled 2019-10-27 (×2): qty 1

## 2019-10-27 MED ORDER — ATENOLOL 50 MG PO TABS
25.0000 mg | ORAL_TABLET | Freq: Every day | ORAL | Status: DC
  Administered 2019-10-28 – 2019-10-29 (×2): 25 mg via ORAL
  Filled 2019-10-27 (×2): qty 1

## 2019-10-27 MED ORDER — VALSARTAN-HYDROCHLOROTHIAZIDE 320-12.5 MG PO TABS: 1.0000 | ORAL_TABLET | Freq: Every day | ORAL | Status: DC

## 2019-10-27 MED ORDER — HYDROCHLOROTHIAZIDE 12.5 MG PO CAPS
12.5000 mg | ORAL_CAPSULE | Freq: Every day | ORAL | Status: DC
  Administered 2019-10-28: 12.5 mg via ORAL
  Filled 2019-10-27: qty 1

## 2019-10-27 MED ORDER — ALLOPURINOL 300 MG PO TABS
300.0000 mg | ORAL_TABLET | Freq: Every day | ORAL | Status: DC
  Administered 2019-10-28 (×2): 300 mg via ORAL
  Filled 2019-10-27 (×2): qty 1

## 2019-10-27 MED ORDER — STROKE: EARLY STAGES OF RECOVERY BOOK
Freq: Once | Status: AC
  Administered 2019-10-28: 01:00:00
  Filled 2019-10-27: qty 1

## 2019-10-27 MED ORDER — ACETAMINOPHEN 325 MG PO TABS: 650.0000 mg | ORAL_TABLET | ORAL | Status: DC | PRN

## 2019-10-27 MED ORDER — ADULT MULTIVITAMIN W/MINERALS CH
1.0000 | ORAL_TABLET | Freq: Every day | ORAL | Status: DC
  Administered 2019-10-28 – 2019-10-29 (×2): 1 via ORAL
  Filled 2019-10-27 (×2): qty 1

## 2019-10-27 MED ORDER — MELATONIN 3 MG PO TABS
3.0000 mg | ORAL_TABLET | Freq: Every day | ORAL | Status: DC
  Administered 2019-10-28 (×2): 3 mg via ORAL
  Filled 2019-10-27 (×3): qty 1

## 2019-10-27 MED ORDER — SODIUM CHLORIDE 0.9% FLUSH
3.0000 mL | Freq: Once | INTRAVENOUS | Status: AC
  Administered 2019-10-27: 13:00:00 3 mL via INTRAVENOUS

## 2019-10-27 MED ORDER — DILTIAZEM HCL ER COATED BEADS 180 MG PO CP24
180.0000 mg | ORAL_CAPSULE | Freq: Every day | ORAL | Status: DC
  Administered 2019-10-28: 180 mg via ORAL
  Filled 2019-10-27: qty 1

## 2019-10-27 MED ORDER — OMEGA-3-ACID ETHYL ESTERS 1 G PO CAPS
1.0000 g | ORAL_CAPSULE | Freq: Every day | ORAL | Status: DC
  Administered 2019-10-28 – 2019-10-29 (×2): 1 g via ORAL
  Filled 2019-10-27 (×2): qty 1

## 2019-10-27 MED ORDER — VITAMIN E 45 MG (100 UNIT) PO CAPS
1000.0000 [IU] | ORAL_CAPSULE | Freq: Every day | ORAL | Status: DC
  Administered 2019-10-28 – 2019-10-29 (×2): 1000 [IU] via ORAL
  Filled 2019-10-27 (×2): qty 2

## 2019-10-27 NOTE — Consult Note (Signed)
Referring Physician: Dr. Jonelle Sidle    Chief Complaint: Punctate ischemic infarctions on MRI  HPI: Tyler Sparks is an 78 y.o. male presenting to EMS after having a fall while walking in his neighborhood this AM. The fall was preceded by a feeling of his legs feeling unusually weak, followed by his legs giving out from underneath him. He fell down to one knee without head trauma. He could not get back up and was on the ground for several minutes prior to being assisted back to his feet. He endorsed to Triage RN that he suddenly became dizzy, diaphoretic and pale just prior to the fall, but did not lose consciousness. In the ED, MRI brain was obtained revealing 2 acute, punctate, cortically-based infarctions in the left parietal lobe.   His PMHx includes a-fib on Eliquis, HTN, HLD, a 30 year history of bilateral peripheral neuropathy, skin cancer, gout and sleep apnea.   MRI brain: 1. Positive for several punctate acute infarcts in the posterior left MCA territory, superimposed on several small chronic cortical infarcts in that region. No associated hemorrhage or mass effect. 2. Elsewhere largely unremarkable for age non-contrast MRI appearance of the brain.  LSN: Saturday AM tPA Given: No: Asymptomatic on arrival.   Past Medical History:  Diagnosis Date  . Atrial fibrillation ([place])    2008  . Cancer ([place])    skin  . Cholecystitis 10/2018  . Gout   . HTN (hypertension)   . Hyperlipidemia   . Nephrolithiasis   . Peripheral neuropathy   . Sleep apnea     Past Surgical History:  Procedure Laterality Date  . ARM SKIN LESION BIOPSY / EXCISION Right 8 15  . CHOLECYSTECTOMY N/A 11/01/2018   Procedure: LAPAROSCOPIC CHOLECYSTECTOMY;  Surgeon: Clovis Riley, MD;  Location: [facility];  Service: General;  Laterality: N/A;  . [place]  . SKIN CANCER EXCISION  2008, 07/2014  . TONSILECTOMY/ADENOIDECTOMY WITH MYRINGOTOMY  1950  . WISDOM TOOTH EXTRACTION      Family History   Problem Relation Age of Onset  . Heart attack Father   . Heart attack Mother   . Colon cancer Neg Hx   . Esophageal cancer Neg Hx   . Rectal cancer Neg Hx   . Stomach cancer Neg Hx    Social History:  reports that he quit smoking about 45 years ago. He has never used smokeless tobacco. He reports current alcohol use of about 14.0 standard drinks of alcohol per week. He reports that he does not use drugs.  Allergies:  Allergies  Allergen Reactions  . Peanuts [Peanut Oil] Anaphylaxis  . Eggs Or Egg-Derived Products Hives, Itching and Swelling    Medications:  No current facility-administered medications on file prior to encounter.    Current Outpatient Medications on File Prior to Encounter  Medication Sig Dispense Refill  . allopurinol (ZYLOPRIM) 300 MG tablet Take 300 mg by mouth at bedtime.     Marland Kitchen atenolol (TENORMIN) 25 MG tablet Take 25 mg by mouth daily.    Marland Kitchen atorvastatin (LIPITOR) 10 MG tablet Take 10 mg by mouth at bedtime.     Marland Kitchen co-enzyme Q-10 50 MG capsule Take 50 mg by mouth daily.    . colchicine 0.6 MG tablet Take 0.6 mg by mouth daily as needed (FOR GOUT FLARES).     Marland Kitchen diltiazem (CARDIZEM CD) 180 MG 24 hr capsule Take 180 mg by mouth daily.    . Melatonin 3 MG CAPS Take 3 mg by  mouth at bedtime.     . Misc Natural Products (OSTEO BI-FLEX JOINT SHIELD PO) Take 1 tablet by mouth 2 (two) times daily.     . Multiple Vitamin (MULTIVITAMIN) tablet Take 1 tablet by mouth daily.    . Omega-3 Fatty Acids (FISH OIL) 1000 MG CAPS Take 1,000 mg by mouth daily.     . valsartan-hydrochlorothiazide (DIOVAN-HCT) 320-12.5 MG tablet Take 1 tablet by mouth daily.    . vitamin C (ASCORBIC ACID) 500 MG tablet Take 500 mg by mouth daily.    . vitamin E 1000 UNIT capsule Take 1,000 Units by mouth daily.    Alveda Reasons 20 MG TABS tablet TAKE ONE TABLET BY MOUTH DAILY WITH SUPPER (Patient taking differently: Take 20 mg by mouth daily with supper. ) 90 tablet 1     ROS: No fevers, chills or  other symptoms of illness. No vomiting. Currently with no dizziness, CP, SOB, visual changes or headache. Other symptom review as per HPI with comprehensive ROS otherwise negative.    Physical Examination: Blood pressure (!) 179/108, pulse (!) 54, temperature 98.7 F (37.1 C), temperature source Oral, resp. rate (!) 23, height 5\' 9"  (1.753 m), weight 114.3 kg, SpO2 98 %.  HEENT: [place]/AT Lungs: Respirations unlabored Ext: Mild edema distal BLE  Neurologic Examination: Mental Status: Alert, oriented, thought content appropriate.  Speech fluent without evidence of aphasia.  Able to follow all commands without difficulty. Cranial Nerves: II:  Visual fields intact with no extinction to DSS. Pupils equal.  III,IV, VI: No ptosis. EOMI without nystagmus.  V,VII: Smile symmetric, facial temp sensation equal bilaterally VIII: hearing intact to conversation IX,X: No hypophonia XI: Symmetric shoulder shrug XII: midline tongue extension  Motor: Right : Upper extremity   5/5    Left:     Upper extremity   5/5  Lower extremity   5/5     Lower extremity   5/5 Tone and bulk:normal tone throughout; no atrophy noted No pronator drift Sensory: Temp and light touch intact throughout, bilaterally. No extinction. Deep Tendon Reflexes:  1+ bilateral upper extremities and patellae. 0 achilles bilaterally. Toes mute.  Cerebellar: No ataxia with FNF bilaterally. Intention tremor is noted bilaterally.  Gait: Deferred  Results for orders placed or performed during the hospital encounter of 10/27/19 (from the past 48 hour(s))  Basic metabolic panel     Status: Abnormal   Collection Time: 10/27/19  1:00 PM  Result Value Ref Range   Sodium 142 135 - 145 mmol/L   Potassium 3.4 (L) 3.5 - 5.1 mmol/L   Chloride 105 98 - 111 mmol/L   CO2 26 22 - 32 mmol/L   Glucose, Bld 149 (H) 70 - 99 mg/dL   BUN 27 (H) 8 - 23 mg/dL   Creatinine, Ser 1.36 (H) 0.61 - 1.24 mg/dL   Calcium 9.4 8.9 - 10.3 mg/dL   GFR calc non Af  Amer 50 (L) >60 mL/min   GFR calc Af Amer 58 (L) >60 mL/min   Anion gap 11 5 - 15    Comment: Performed at [facility], [address] [place] [address]  CBC     Status: Abnormal   Collection Time: 10/27/19  1:00 PM  Result Value Ref Range   WBC 3.4 (L) 4.0 - 10.5 K/uL   RBC 4.56 4.22 - 5.81 MIL/uL   Hemoglobin 14.8 13.0 - 17.0 g/dL   HCT 43.6 39.0 - 52.0 %   MCV 95.6 80.0 - 100.0  fL   MCH 32.5 26.0 - 34.0 pg   MCHC 33.9 30.0 - 36.0 g/dL   RDW 13.4 11.5 - 15.5 %   Platelets 172 150 - 400 K/uL   nRBC 0.0 0.0 - 0.2 %    Comment: Performed at [facility], [place] [address] [place], [place] [address]  Brain natriuretic peptide     Status: None   Collection Time: 10/27/19  1:00 PM  Result Value Ref Range   B Natriuretic Peptide 84.1 0.0 - 100.0 pg/mL    Comment: Performed at [facility] [address], [place], [place] [address]  CK     Status: None   Collection Time: 10/27/19  2:22 PM  Result Value Ref Range   Total CK 85 49 - 397 U/L    Comment: Performed at [facility], [place] [address] [place], [place] [address]  Troponin I (High Sensitivity)     Status: None   Collection Time: 10/27/19  2:22 PM  Result Value Ref Range   Troponin I (High Sensitivity) 8 <18 ng/L    Comment: (NOTE) Elevated high sensitivity troponin I (hsTnI) values and significant  changes across serial measurements may suggest ACS but many other  chronic and acute conditions are known to elevate hsTnI results.  Refer to the "Links" section for chest pain algorithms and additional  guidance. Performed at [facility], [place] [address] [place], [place] [address]   Urinalysis, Routine w reflex microscopic     Status: Abnormal   Collection Time: 10/27/19  3:16 PM  Result Value Ref Range   Color, Urine YELLOW YELLOW   APPearance CLEAR CLEAR   Specific Gravity, Urine 1.016 1.005 - 1.030   pH 7.0 5.0 - 8.0   Glucose, UA NEGATIVE NEGATIVE mg/dL   Hgb urine dipstick  NEGATIVE NEGATIVE   Bilirubin Urine NEGATIVE NEGATIVE   Ketones, ur 5 (A) NEGATIVE mg/dL   Protein, ur NEGATIVE NEGATIVE mg/dL   Nitrite NEGATIVE NEGATIVE   Leukocytes,Ua NEGATIVE NEGATIVE    Comment: Performed at [facility] [address] [place], [place] [address]  Troponin I (High Sensitivity)     Status: None   Collection Time: 10/27/19  4:23 PM  Result Value Ref Range   Troponin I (High Sensitivity) 9 <18 ng/L    Comment: (NOTE) Elevated high sensitivity troponin I (hsTnI) values and significant  changes across serial measurements may suggest ACS but many other  chronic and acute conditions are known to elevate hsTnI results.  Refer to the "Links" section for chest pain algorithms and additional  guidance. Performed at [facility], [place] [address], [place], [place] [address]    Mr Brain 90 Contrast  Result Date: 10/27/2019 CLINICAL DATA:  78 year old male with near syncope. Weakness dizziness and diaphoresis. EXAM: MRI HEAD WITHOUT CONTRAST TECHNIQUE: Multiplanar, multiecho pulse sequences of the brain and surrounding structures were obtained without intravenous contrast. COMPARISON:  None. FINDINGS: Brain: There are several punctate foci of restricted diffusion in the anterior superior left parietal lobe near the perirolandic cortex (series 5, images 95 and 96). Mild if any associated T2 and FLAIR hyperintensity. No hemorrhage or mass effect. Nearby small area of chronic cortical encephalomalacia in the left motor strip on series 9, image 21. similar area in the more anterior left middle frontal gyrus on image 20. Possible chronic microhemorrhage in the left frontal lobe subcortical white matter on series 12, image 36, but otherwise there is  normal for age gray and white matter signal aside from the above findings. No other restricted diffusion. No midline shift, mass effect, evidence of mass lesion, ventriculomegaly, extra-axial collection or acute intracranial  hemorrhage. Cervicomedullary junction within normal limits. Partially empty sella. Vascular: Major intracranial vascular flow voids are preserved. The left vertebral artery appears dominant and the right diminutive. Skull and upper cervical spine: Negative for age visible cervical spine. Normal bone marrow signal. Sinuses/Orbits: Negative orbits. Paranasal sinuses are well pneumatized. Other: Mastoids are clear. Visible internal auditory structures appear normal. Negative scalp and face soft tissues. IMPRESSION: 1. Positive for several punctate acute infarcts in the posterior left MCA territory, superimposed on several small chronic cortical infarcts in that region. No associated hemorrhage or mass effect. 2. Elsewhere largely unremarkable for age non-contrast MRI appearance of the brain. Electronically Signed   By: Genevie Ann M.D.   On: 10/27/2019 17:54    Assessment: 78 y.o. male with punctate acute infarct in the posterior left MCA territory 1. Exam is nonfocal due to the relatively low stroke burden.  2. Suspect cardioembolic etiology given the locations of the acute infarctions as well as old infarctions that are also cortically based. Has atrial fibrillation and is on oral anticoagulation.  Stroke Risk Factors -  a-fib, HTN, HLD and sleep apnea.   Plan: 1. HgbA1c, fasting lipid panel 2. MRA of the brain without contrast 3. PT consult, OT consult, Speech consult 4. Echocardiogram 5. Carotid dopplers 6. Prophylactic therapy- Continue Eliquis. On low-dose Lipitor.  7. Risk factor modification 8. Telemetry monitoring 9. Frequent neuro checks 10. BP management.     @Electronically  signed: Dr. Kerney Elbe 10/27/2019, 7:22 PM

## 2019-10-27 NOTE — ED Provider Notes (Signed)
[facility] EMERGENCY DEPARTMENT Provider Note   CSN: [id number] Arrival date & time: 10/27/19  1244     History   Chief Complaint Chief Complaint  Patient presents with  . Near Syncope    HPI Tyler Sparks is a 78 y.o. male with past medical history significant for atrial fibrillation on Eliquis, HTN, HLD, and peripheral neuropathy of unknown etiology who presents to the ED via EMS after a sustaining mechanical fall while going for a walk around the neighborhood this morning.  Patient states that he typically goes for a walk once per week.  He walked approximately 3 miles last evening, which is more than he typically walks, and then decided to go for another walk this morning.  After 1 mile, he states that his legs felt particularly weak and he remembered that he had just gone for a walk less than 18 hours previously, which is unusual for him.  Around the 1.5 mile marker he states as though his legs "gave out from underneath him" and he stumbled, falling to his knees.  He denies any precipitating dizziness, visual changes, chest pain, shortness of breath, diaphoresis, headache, or any other symptoms.  He then was unable to get back up to his feet due to weakness and was on the ground for approximately 10 minutes before one of his neighbors assisted him to his feet.  He believes that his increased activity is what contributed to his fall, in addition to his bilateral peripheral neuropathy that he has had for nearly 3 decades that he reports causes balance disturbance.  Patient also contributes that he has been on a keto diet for approximately 2 months and questions whether or not that played a role.  He also denies any recent illness, fevers or chills, recent diarrhea or vomiting, or any other symptoms.     HPI  Past Medical History:  Diagnosis Date  . Atrial fibrillation ([place])    2008  . Cancer ([place])    skin  . Cholecystitis 10/2018  . Gout   . HTN (hypertension)    . Hyperlipidemia   . Nephrolithiasis   . Peripheral neuropathy   . Sleep apnea     Patient Active Problem List   Diagnosis Date Noted  . Essential hypertension 11/01/2018  . AKI (acute kidney injury) ([place]) 11/01/2018  . Hyperlipidemia 11/01/2018  . Cholecystitis 10/30/2018  . Peripheral neuropathy 07/22/2014  . Atrial fibrillation ([place]) 06/26/2014    Past Surgical History:  Procedure Laterality Date  . ARM SKIN LESION BIOPSY / EXCISION Right 8 15  . CHOLECYSTECTOMY N/A 11/01/2018   Procedure: LAPAROSCOPIC CHOLECYSTECTOMY;  Surgeon: Clovis Riley, MD;  Location: [facility];  Service: General;  Laterality: N/A;  . [place]  . SKIN CANCER EXCISION  2008, 07/2014  . TONSILECTOMY/ADENOIDECTOMY WITH MYRINGOTOMY  1950  . WISDOM TOOTH EXTRACTION          Home Medications    Prior to Admission medications   Medication Sig Start Date End Date Taking? Authorizing Provider  allopurinol (ZYLOPRIM) 300 MG tablet Take 300 mg by mouth at bedtime.     [provider]  atenolol (TENORMIN) 25 MG tablet Take 25 mg by mouth daily.    [provider]  atorvastatin (LIPITOR) 10 MG tablet Take 10 mg by mouth at bedtime.     [provider]  co-enzyme Q-10 50 MG capsule Take 50 mg by mouth daily.    [provider]  colchicine 0.6  MG tablet Take 0.6 mg by mouth daily as needed (FOR GOUT FLARES).     [provider]  diltiazem (CARDIZEM CD) 180 MG 24 hr capsule Take 180 mg by mouth daily.    [provider]  Melatonin 3 MG CAPS Take 3 mg by mouth at bedtime.     [provider]  Misc Natural Products (OSTEO BI-FLEX JOINT SHIELD PO) Take 1 tablet by mouth 2 (two) times daily.     [provider]  Multiple Vitamin (MULTIVITAMIN) tablet Take 1 tablet by mouth daily.    [provider]  Omega-3 Fatty Acids (FISH OIL) 1000 MG CAPS Take 1,000 mg by mouth daily.     [provider]   valsartan-hydrochlorothiazide (DIOVAN-HCT) 320-12.5 MG tablet Take 1 tablet by mouth daily.    [provider]  vitamin C (ASCORBIC ACID) 500 MG tablet Take 500 mg by mouth daily.    [provider]  vitamin E 1000 UNIT capsule Take 1,000 Units by mouth daily.    [provider]  XARELTO 20 MG TABS tablet TAKE ONE TABLET BY MOUTH DAILY WITH SUPPER 07/31/19   Burnell Blanks, MD    Family History Family History  Problem Relation Age of Onset  . Heart attack Father   . Heart attack Mother   . Colon cancer Neg Hx   . Esophageal cancer Neg Hx   . Rectal cancer Neg Hx   . Stomach cancer Neg Hx     Social History Social History   Tobacco Use  . Smoking status: Former Smoker    Quit date: 12/26/1973    Years since quitting: 45.8  . Smokeless tobacco: Never Used  Substance Use Topics  . Alcohol use: Yes    Alcohol/week: 14.0 standard drinks    Types: 14 Standard drinks or equivalent per week    Comment: OCCASIONAL  . Drug use: No     Allergies   Peanuts [peanut oil] and Eggs or egg-derived products   Review of Systems Review of Systems  All other systems reviewed and are negative.    Physical Exam Updated Vital Signs BP (!) 162/87   Pulse 73   Temp 98.7 F (37.1 C) (Oral)   Resp 18   Ht 5\' 9"  (1.753 m)   Wt 114.3 kg   SpO2 98%   BMI 37.21 kg/m   Physical Exam Vitals signs and nursing note reviewed. Exam conducted with a chaperone present.  Constitutional:      Appearance: Normal appearance.  HENT:     Head: Normocephalic and atraumatic.  Eyes:     General: No scleral icterus.    Conjunctiva/sclera: Conjunctivae normal.  Cardiovascular:     Rate and Rhythm: Normal rate. Rhythm irregular.     Pulses: Normal pulses.  Pulmonary:     Effort: Pulmonary effort is normal. No respiratory distress.     Breath sounds: Normal breath sounds. No wheezing or rales.  Abdominal:     General: Abdomen is flat. There is no distension.      Palpations: Abdomen is soft.     Tenderness: There is no abdominal tenderness. There is no guarding.  Musculoskeletal:     Right lower leg: Edema present.     Left lower leg: Edema present.     Comments: 1+ pitting edema.  Skin:    General: Skin is dry.     Comments: Mild abrasion over right tibia.  Nonbleeding.  Neurological:     Mental Status:  He is alert.     GCS: GCS eye subscore is 4. GCS verbal subscore is 5. GCS motor subscore is 6.  Psychiatric:        Mood and Affect: Mood normal.        Behavior: Behavior normal.        Thought Content: Thought content normal.      ED Treatments / Results  Labs (all labs ordered are listed, but only abnormal results are displayed) Labs Reviewed  BASIC METABOLIC PANEL - Abnormal; Notable for the following components:      Result Value   Potassium 3.4 (*)    Glucose, Bld 149 (*)    BUN 27 (*)    Creatinine, Ser 1.36 (*)    GFR calc non Af Amer 50 (*)    GFR calc Af Amer 58 (*)    All other components within normal limits  CBC - Abnormal; Notable for the following components:   WBC 3.4 (*)    All other components within normal limits  BRAIN NATRIURETIC PEPTIDE  CK  URINALYSIS, ROUTINE W REFLEX MICROSCOPIC  TROPONIN I (HIGH SENSITIVITY)  TROPONIN I (HIGH SENSITIVITY)    EKG EKG Interpretation  Date/Time:  Saturday October 27 2019 12:47:54 EST Ventricular Rate:  84 PR Interval:    QRS Duration: 124 QT Interval:  474 QTC Calculation: 540 R Axis:   13 Text Interpretation: Atrial fibrillation Nonspecific intraventricular conduction delay Inferior infarct, age indeterminate Lateral leads are also involved When comapred to prior, similar afib. No STEMI Confirmed by Antony Blackbird [phone]) on 10/27/2019 1:51:56 PM   Radiology No results found.  Procedures Procedures (including critical care time)  Medications Ordered in ED Medications  sodium chloride flush (NS) 0.9 % injection 3 mL (3 mLs Intravenous Given 10/27/19 1254)      Initial Impression / Assessment and Plan / ED Course  I have reviewed the triage vital signs and the nursing notes.  Pertinent labs & imaging results that were available during my care of the patient were reviewed by me and considered in my medical decision making (see chart for details).       His BMP demonstrates mildly elevated BUN possibly suggesting dehydration and he is mildly hypokalemic to 3.4, perhaps attributable to his keto diet.  Encouraged him to eat banana or potato to replenish his potassium.  His lab work is otherwise reassuring and consistent with his baseline.  Given his weakness and increased physical activity, obtained CK to rule out exercise-induced hemolysis and a troponin to rule out ACS as cause of his weakness and fatigue.  Patient CK was 85 and his initial troponin is also reassuring and within normal limits.  Patient had complained of swollen legs x6 months and recently began using compression stockings.  I reviewed his chart and he is followed by [facility], but reports it has been a while since he has seen them.  We will obtain BNP to rule out fluid overload status and new onset heart failure.  BMP was obtained and is within normal limits.   Despite EMS reports of presyncopal event, patient tells me that this was purely mechanical fall.  He denies any preceding or subsequent visual changes, dizziness, diaphoresis, chest pain, shortness of breath, or other symptoms associated with his mechanical fall and weakness.  There are no electrolyte abnormalities that may explain his weakness.  He believes that it is entirely attributable to his increased exercise activity with possible peripheral neuropathy involvement.   3:35 PM  On reevaluation, patient mentioned that it was primarily his right leg that gave out and then he had difficulty telling EMS his phone number, the year, and was uncharacteristically forgetful and confused.  This obviously elicits concern for TIA  and will consult neurology.  Neurology agrees that we should obtain MRI brain w/o contrast to r/o stroke.   At shift change care was transferred to [facility] who will follow pending studies, re-evaluate, and determine disposition.     Final Clinical Impressions(s) / ED Diagnoses   Final diagnoses:  Fall, initial encounter    ED Discharge Orders    None       Corena Herter, PA-C 10/27/19 1615    Tegeler, Gwenyth Allegra, MD 10/28/19 [phone]

## 2019-10-27 NOTE — Progress Notes (Signed)
Tyler Sparks [id number] Admitted to 5W17: 10/27/2019 9:37 PM Attending Provider: Elwyn Reach, MD    Tyler Sparks is a 78 y.o. male patient admitted from ED awake, alert  & orientated  X 3,  Prior, VSS - Blood pressure (!) 159/90, pulse 78, temperature 98.7 F (37.1 C), temperature source Oral, resp. rate 18, height 5\' 9"  (1.753 m), weight 114.3 kg, SpO2 97 %., no c/o shortness of breath, no c/o chest pain, no distress noted. Tele # [id number] placed and pt is currently running: afib   IV site WDL:  with a transparent dsg that's clean dry and intact.  Allergies:   Allergies  Allergen Reactions  . Peanuts [Peanut Oil] Anaphylaxis  . Eggs Or Egg-Derived Products Hives, Itching and Swelling     Past Medical History:  Diagnosis Date  . Atrial fibrillation ([place])    2008  . Cancer ([place])    skin  . Cholecystitis 10/2018  . Gout   . HTN (hypertension)   . Hyperlipidemia   . Nephrolithiasis   . Peripheral neuropathy   . Sleep apnea     History:  obtained from patient  Pt orientation to unit, room and routine. Information packet given to patient.  Admission INP armband ID verified with patient, and in place. SR up x 2, fall risk assessment complete with Patient verbalizing understanding of risks associated with falls. Pt verbalizes an understanding of how to use the call bell and to call for help before getting out of bed.  Skin, see flow sheet.  Will cont to monitor and assist as needed.  Tyler Ames, RN 10/27/2019 9:37 PM

## 2019-10-27 NOTE — ED Notes (Signed)
Help get patient undress on the monitor did ekg shown to the er doctor patient is resting with with call bell in reach

## 2019-10-27 NOTE — H&P (Signed)
History and Physical   Tyler Sparks [id number] DOB: 06-Apr-1941 DOA: 10/27/2019  Referring MD/NP/PA: Dr. Sherry Ruffing  PCP: Velna Hatchet, MD   Outpatient Specialists: Burnell Blanks, MD, cardiology  Patient coming from: Home  Chief Complaint: Lower extremity weakness and speech changes  HPI: Tyler Sparks is a 78 y.o. male with medical history significant of atrial fibrillation, cholecystitis, chronic anticoagulation with Xarelto, hypertension, hyperlipidemia, peripheral neuropathy and obstructive sleep apnea who presented today with a fall after feeling weak in bilateral lower extremities today.  He was walking in the neighborhood when he started feeling the weakness bilaterally followed by the fall.  Patient subsequently had EMS arrived.  At the time he realized he was confused.  Was unable to give correct dates.  He could also not recall some recent events.  Patient was brought into the ER where he was initially evaluated for the fall.  Later MRI was done showing acute CVA.  Patient denied losing consciousness denied hitting his head.  He laid on the ground for couple of minutes after he fell.  He did feel some dizzy and swell as diaphoretic.  He has been on chronic anticoagulation for his A. fib and he has been consistent with all his medications.  At this point patient's symptoms have reverted to normal..  ED Course: Temperature 97 blood pressure 179/108 pulse 78 respirate of 23 oxygen sat 94% on room air.  White count is 3.4 hemoglobin 14.8 and platelets 172.  Sodium 142 potassium 3.4 chloride 105 CO2 26 BUN is 27 and creatinine 1.36 calcium 9.4.  Urinalysis negative.  COVID-19 screen is currently pending.  CK 85 and troponin 8.  MRI of the brain showed findings consistent with punctate acute infarcts in posterior left MCA territory.  Also small chronic cortical infarcts.  No hemorrhage or mass-effects.  Neurology consulted and patient admitted for treat  Review of Systems: As per  HPI otherwise 10 point review of systems negative.    Past Medical History:  Diagnosis Date   Atrial fibrillation ([place])    2008   Cancer [facility])    skin   Cholecystitis 10/2018   Gout    HTN (hypertension)    Hyperlipidemia    Nephrolithiasis    Peripheral neuropathy    Sleep apnea     Past Surgical History:  Procedure Laterality Date   ARM SKIN LESION BIOPSY / EXCISION Right 8 15   CHOLECYSTECTOMY N/A 11/01/2018   Procedure: LAPAROSCOPIC CHOLECYSTECTOMY;  Surgeon: Clovis Riley, MD;  Location: [facility];  Service: General;  Laterality: N/A;   [place]  2008, 07/2014   TONSILECTOMY/ADENOIDECTOMY WITH MYRINGOTOMY  1950   WISDOM TOOTH EXTRACTION       reports that he quit smoking about 45 years ago. He has never used smokeless tobacco. He reports current alcohol use of about 14.0 standard drinks of alcohol per week. He reports that he does not use drugs.  Allergies  Allergen Reactions   Peanuts [Peanut Oil] Anaphylaxis   Eggs Or Egg-Derived Products Hives, Itching and Swelling    Family History  Problem Relation Age of Onset   Heart attack Father    Heart attack Mother    Colon cancer Neg Hx    Esophageal cancer Neg Hx    Rectal cancer Neg Hx    Stomach cancer Neg Hx      Prior to Admission medications   Medication Sig Start Date End Date Taking? Authorizing Provider  allopurinol (  ZYLOPRIM) 300 MG tablet Take 300 mg by mouth at bedtime.    Yes [provider]  atenolol (TENORMIN) 25 MG tablet Take 25 mg by mouth daily.   Yes [provider]  atorvastatin (LIPITOR) 10 MG tablet Take 10 mg by mouth at bedtime.    Yes [provider]  co-enzyme Q-10 50 MG capsule Take 50 mg by mouth daily.   Yes [provider]  colchicine 0.6 MG tablet Take 0.6 mg by mouth daily as needed (FOR GOUT FLARES).    Yes [provider]  diltiazem (CARDIZEM CD) 180 MG 24 hr capsule Take 180  mg by mouth daily.   Yes [provider]  Melatonin 3 MG CAPS Take 3 mg by mouth at bedtime.    Yes [provider]  Misc Natural Products (OSTEO BI-FLEX JOINT SHIELD PO) Take 1 tablet by mouth 2 (two) times daily.    Yes [provider]  Multiple Vitamin (MULTIVITAMIN) tablet Take 1 tablet by mouth daily.   Yes [provider]  Omega-3 Fatty Acids (FISH OIL) 1000 MG CAPS Take 1,000 mg by mouth daily.    Yes [provider]  valsartan-hydrochlorothiazide (DIOVAN-HCT) 320-12.5 MG tablet Take 1 tablet by mouth daily.   Yes [provider]  vitamin C (ASCORBIC ACID) 500 MG tablet Take 500 mg by mouth daily.   Yes [provider]  vitamin E 1000 UNIT capsule Take 1,000 Units by mouth daily.   Yes [provider]  XARELTO 20 MG TABS tablet TAKE ONE TABLET BY MOUTH DAILY WITH SUPPER Patient taking differently: Take 20 mg by mouth daily with supper.  07/31/19  Yes Burnell Blanks, MD    Physical Exam: Vitals:   10/27/19 1855 10/27/19 1945 10/27/19 2000 10/27/19 2121  BP: (!) 179/108 (!) 139/98 (!) 159/96 (!) 159/90  Pulse: (!) 54 (!) 57 72 78  Resp: (!) 23 14 13 18   Temp:      TempSrc:      SpO2: 98% 94% 97% 97%  Weight:      Height:          Constitutional: NAD, calm, comfortable Vitals:   10/27/19 1855 10/27/19 1945 10/27/19 2000 10/27/19 2121  BP: (!) 179/108 (!) 139/98 (!) 159/96 (!) 159/90  Pulse: (!) 54 (!) 57 72 78  Resp: (!) 23 14 13 18   Temp:      TempSrc:      SpO2: 98% 94% 97% 97%  Weight:      Height:       Eyes: PERRL, lids and conjunctivae normal ENMT: Mucous membranes are moist. Posterior pharynx clear of any exudate or lesions.Normal dentition.  Neck: normal, supple, no masses, no thyromegaly Respiratory: clear to auscultation bilaterally, no wheezing, no crackles. Normal respiratory effort. No accessory muscle use.  Cardiovascular: Irregularly irregular rate and rhythm, no murmurs /  rubs / gallops. No extremity edema. 2+ pedal pulses. No carotid bruits.  Abdomen: no tenderness, no masses palpated. No hepatosplenomegaly. Bowel sounds positive.  Musculoskeletal: no clubbing / cyanosis. No joint deformity upper and lower extremities. Good ROM, no contractures. Normal muscle tone.  Skin: no rashes, lesions, ulcers. No induration Neurologic: CN 2-12 grossly intact. Sensation intact, DTR normal. Strength 5/5 in all 4.  Psychiatric: Normal judgment and insight. Alert and oriented x 3. Normal mood.     Labs on Admission: I have personally reviewed following labs and imaging studies  CBC: Recent Labs  Lab 10/27/19 1300  WBC 3.4*  HGB 14.8  HCT 43.6  MCV 95.6  PLT [id number]   Basic Metabolic Panel: Recent Labs  Lab 10/27/19 1300  NA 142  K 3.4*  CL 105  CO2 26  GLUCOSE 149*  BUN 27*  CREATININE 1.36*  CALCIUM 9.4   GFR: Estimated Creatinine Clearance: 56.7 mL/min (A) (by C-G formula based on SCr of 1.36 mg/dL (H)). Liver Function Tests: No results for input(s): AST, ALT, ALKPHOS, BILITOT, PROT, ALBUMIN in the last 168 hours. No results for input(s): LIPASE, AMYLASE in the last 168 hours. No results for input(s): AMMONIA in the last 168 hours. Coagulation Profile: No results for input(s): INR, PROTIME in the last 168 hours. Cardiac Enzymes: Recent Labs  Lab 10/27/19 1422  CKTOTAL 85   BNP (last 3 results) No results for input(s): PROBNP in the last 8760 hours. HbA1C: No results for input(s): HGBA1C in the last 72 hours. CBG: No results for input(s): GLUCAP in the last 168 hours. Lipid Profile: No results for input(s): CHOL, HDL, LDLCALC, TRIG, CHOLHDL, LDLDIRECT in the last 72 hours. Thyroid Function Tests: No results for input(s): TSH, T4TOTAL, FREET4, T3FREE, THYROIDAB in the last 72 hours. Anemia Panel: No results for input(s): VITAMINB12, FOLATE, FERRITIN, TIBC, IRON, RETICCTPCT in the last 72 hours. Urine analysis:    Component Value Date/Time     COLORURINE YELLOW 10/27/2019 [place] 10/27/2019 1516   LABSPEC 1.016 10/27/2019 1516   PHURINE 7.0 10/27/2019 1516   GLUCOSEU NEGATIVE 10/27/2019 1516   HGBUR NEGATIVE 10/27/2019 1516   BILIRUBINUR NEGATIVE 10/27/2019 1516   KETONESUR 5 (A) 10/27/2019 1516   PROTEINUR NEGATIVE 10/27/2019 1516   NITRITE NEGATIVE 10/27/2019 1516   LEUKOCYTESUR NEGATIVE 10/27/2019 1516   Sepsis Labs: @LABRCNTIP (procalcitonin:4,lacticidven:4) )No results found for this or any previous visit (from the past 240 hour(s)).   Radiological Exams on Admission: Mr Brain Wo Contrast  Result Date: 10/27/2019 CLINICAL DATA:  78 year old male with near syncope. Weakness dizziness and diaphoresis. EXAM: MRI HEAD WITHOUT CONTRAST TECHNIQUE: Multiplanar, multiecho pulse sequences of the brain and surrounding structures were obtained without intravenous contrast. COMPARISON:  None. FINDINGS: Brain: There are several punctate foci of restricted diffusion in the anterior superior left parietal lobe near the perirolandic cortex (series 5, images 95 and 96). Mild if any associated T2 and FLAIR hyperintensity. No hemorrhage or mass effect. Nearby small area of chronic cortical encephalomalacia in the left motor strip on series 9, image 21. similar area in the more anterior left middle frontal gyrus on image 20. Possible chronic microhemorrhage in the left frontal lobe subcortical white matter on series 12, image 36, but otherwise there is normal for age gray and white matter signal aside from the above findings. No other restricted diffusion. No midline shift, mass effect, evidence of mass lesion, ventriculomegaly, extra-axial collection or acute intracranial hemorrhage. Cervicomedullary junction within normal limits. Partially empty sella. Vascular: Major intracranial vascular flow voids are preserved. The left vertebral artery appears dominant and the right diminutive. Skull and upper cervical spine: Negative for  age visible cervical spine. Normal bone marrow signal. Sinuses/Orbits: Negative orbits. Paranasal sinuses are well pneumatized. Other: Mastoids are clear. Visible internal auditory structures appear normal. Negative scalp and face soft tissues. IMPRESSION: 1. Positive for several punctate acute infarcts in the posterior left MCA territory, superimposed on several small chronic cortical infarcts in that region. No associated hemorrhage or mass effect. 2. Elsewhere largely unremarkable for age non-contrast MRI appearance of the brain. Electronically Signed   By: Lemmie Evens  [place] Crane M.D.   On: 10/27/2019 17:54    EKG: Independently reviewed.  Shows atrial fibrillation with a rate of 84.  No significant ST change  Assessment/Plan Principal Problem:   Acute cerebrovascular accident (CVA) ([place]) Active Problems:   Atrial fibrillation ([place])   Essential hypertension   Hyperlipidemia   OSA (obstructive sleep apnea)     #1 acute CVA: Patient will be admitted.  Neurology consulted.  We will continue with his anticoagulation once he is passed swallow eval.  Statin and low-dose aspirin.  May require cerebral angiogram in a.m.  PT with OT and speech therapy.  He is currently asymptomatic.  #2 atrial fibrillation: Rate is controlled.  Continue with anticoagulation once cleared.  #3 hyperlipidemia: Continue with statin  #4 obstructive sleep apnea: Patient uses CPAP at night.  He has a variable pressure CPAP.  We will order CPAP while in the hospital  #5 essential hypertension: Possibly permissive hypertension.  Defer decision to neurology.  #6 hypokalemia: Replete potassium   DVT prophylaxis: Xarelto Code Status: Full Family Communication: No family Disposition Plan: Home Consults called: Dr. Kerney Elbe, neurology Admission status: Inpatient  Severity of Illness: The appropriate patient status for this patient is INPATIENT. Inpatient status is judged to be reasonable and necessary in order to provide the  required intensity of service to ensure the patient's safety. The patient's presenting symptoms, physical exam findings, and initial radiographic and laboratory data in the context of their chronic comorbidities is felt to place them at high risk for further clinical deterioration. Furthermore, it is not anticipated that the patient will be medically stable for discharge from the hospital within 2 midnights of admission. The following factors support the patient status of inpatient.   " The patient's presenting symptoms include fall with speech changes. " The worrisome physical exam findings include no significant logic deficits. " The initial radiographic and laboratory data are worrisome because of MRI showing multiple acute infarcts. " The chronic co-morbidities include atrial fibrillation with hypertension.   * I certify that at the point of admission it is my clinical judgment that the patient will require inpatient hospital care spanning beyond 2 midnights from the point of admission due to high intensity of service, high risk for further deterioration and high frequency of surveillance required.Barbette Merino MD [facility] Pager [phone]  If 7PM-7AM, please contact night-coverage [email or web address] Password [facility]  10/27/2019, 10:54 PM

## 2019-10-27 NOTE — Discharge Instructions (Addendum)
Please follow-up with your PCP, specifically regarding your weakness and keto diet.  Please also follow-up with your cardiologist regarding your leg swelling as well as your neurologist regarding your peripheral neuropathy if you feel it is getting worse.  Please return to the ED for any new or worsening symptoms.

## 2019-10-27 NOTE — ED Triage Notes (Signed)
Pt arrived via EMS for a chief complaint of a near syncopal episode. Pt stated he was out for his morning walk when he suddenly became weak, dizzy, diaphoretic, and pale. Pt went down to one knee, but remembers not losing consciousness. EMS was called by a bystander. Pt is alert and oriented x 4 and has no complaints at this time.

## 2019-10-27 NOTE — ED Notes (Signed)
ED TO INPATIENT HANDOFF REPORT  ED Nurse Name and Phone #: Lovell Sheehan [phone]  S Name/Age/Gender Tyler Sparks 78 y.o. male Room/Bed: 026C/026C  Code Status   Code Status: Prior  Home/SNF/Other Home Patient oriented to: self, place, time and situation Is this baseline? Yes   Triage Complete: Triage complete  Chief Complaint weakness/syncopal episode   Triage Note Pt arrived via EMS for a chief complaint of a near syncopal episode. Pt stated he was out for his morning walk when he suddenly became weak, dizzy, diaphoretic, and pale. Pt went down to one knee, but remembers not losing consciousness. EMS was called by a bystander. Pt is alert and oriented x 4 and has no complaints at this time.    Allergies Allergies  Allergen Reactions  . Peanuts [Peanut Oil] Anaphylaxis  . Eggs Or Egg-Derived Products Hives, Itching and Swelling    Level of Care/Admitting Diagnosis ED Disposition    ED Disposition Condition [facility]: [place] [[id number]]  Level of Care: Telemetry Medical [104]  Covid Evaluation: Asymptomatic Screening Protocol (No Symptoms)  Diagnosis: [facility]) [id number]  Admitting Physician: Elwyn Reach [[id number]]  Attending Physician: Elwyn Reach [[id number]]  Estimated length of stay: past midnight tomorrow  Certification:: I certify this patient will need inpatient services for at least 2 midnights  PT Class (Do Not Modify): Inpatient [101]  PT Acc Code (Do Not Modify): Private [1]       B Medical/Surgery History Past Medical History:  Diagnosis Date  . Atrial fibrillation ([place])    2008  . Cancer ([place])    skin  . Cholecystitis 10/2018  . Gout   . HTN (hypertension)   . Hyperlipidemia   . Nephrolithiasis   . Peripheral neuropathy   . Sleep apnea    Past Surgical History:  Procedure Laterality Date  . ARM SKIN LESION BIOPSY / EXCISION Right 8 15  . CHOLECYSTECTOMY N/A 11/01/2018    Procedure: LAPAROSCOPIC CHOLECYSTECTOMY;  Surgeon: Clovis Riley, MD;  Location: [facility];  Service: General;  Laterality: N/A;  . [place]  . SKIN CANCER EXCISION  2008, 07/2014  . TONSILECTOMY/ADENOIDECTOMY WITH MYRINGOTOMY  1950  . WISDOM TOOTH EXTRACTION       A IV Location/Drains/Wounds Patient Lines/Drains/Airways Status   Active Line/Drains/Airways    Name:   Placement date:   Placement time:   Site:   Days:   Peripheral IV 10/27/19 Right Antecubital   10/27/19    1253    Antecubital   less than 1   Incision (Closed) 11/01/18 Abdomen   11/01/18    1342     360   Incision - 4 Ports Abdomen Left;Lateral Mid;Upper Right;Lateral;Upper Right;Lateral;Lower   11/01/18    1311     360   Incision - 1 Port Abdomen Right;Upper;Medial   11/01/18    1314     360          Intake/Output Last 24 hours No intake or output data in the 24 hours ending 10/27/19 2008  Labs/Imaging Results for orders placed or performed during the hospital encounter of 10/27/19 (from the past 48 hour(s))  Basic metabolic panel     Status: Abnormal   Collection Time: 10/27/19  1:00 PM  Result Value Ref Range   Sodium 142 135 - 145 mmol/L   Potassium 3.4 (L) 3.5 - 5.1 mmol/L   Chloride 105 98 - 111 mmol/L  CO2 26 22 - 32 mmol/L   Glucose, Bld 149 (H) 70 - 99 mg/dL   BUN 27 (H) 8 - 23 mg/dL   Creatinine, Ser 1.36 (H) 0.61 - 1.24 mg/dL   Calcium 9.4 8.9 - 10.3 mg/dL   GFR calc non Af Amer 50 (L) >60 mL/min   GFR calc Af Amer 58 (L) >60 mL/min   Anion gap 11 5 - 15    Comment: Performed at [facility] [address], [place], [place] [address]  CBC     Status: Abnormal   Collection Time: 10/27/19  1:00 PM  Result Value Ref Range   WBC 3.4 (L) 4.0 - 10.5 K/uL   RBC 4.56 4.22 - 5.81 MIL/uL   Hemoglobin 14.8 13.0 - 17.0 g/dL   HCT 43.6 39.0 - 52.0 %   MCV 95.6 80.0 - 100.0 fL   MCH 32.5 26.0 - 34.0 pg   MCHC 33.9 30.0 - 36.0 g/dL   RDW 13.4 11.5 - 15.5 %   Platelets 172 150 - 400  K/uL   nRBC 0.0 0.0 - 0.2 %    Comment: Performed at [facility], [place] [address] [place], [place] [address]  Brain natriuretic peptide     Status: None   Collection Time: 10/27/19  1:00 PM  Result Value Ref Range   B Natriuretic Peptide 84.1 0.0 - 100.0 pg/mL    Comment: Performed at [facility] [address] [place], [place] [address]  CK     Status: None   Collection Time: 10/27/19  2:22 PM  Result Value Ref Range   Total CK 85 49 - 397 U/L    Comment: Performed at [facility], [place] [address] [place], [place] [address]  Troponin I (High Sensitivity)     Status: None   Collection Time: 10/27/19  2:22 PM  Result Value Ref Range   Troponin I (High Sensitivity) 8 <18 ng/L    Comment: (NOTE) Elevated high sensitivity troponin I (hsTnI) values and significant  changes across serial measurements may suggest ACS but many other  chronic and acute conditions are known to elevate hsTnI results.  Refer to the "Links" section for chest pain algorithms and additional  guidance. Performed at [facility], [place] [address], [place], [place] [address]   Urinalysis, Routine w reflex microscopic     Status: Abnormal   Collection Time: 10/27/19  3:16 PM  Result Value Ref Range   Color, Urine YELLOW YELLOW   APPearance CLEAR CLEAR   Specific Gravity, Urine 1.016 1.005 - 1.030   pH 7.0 5.0 - 8.0   Glucose, UA NEGATIVE NEGATIVE mg/dL   Hgb urine dipstick NEGATIVE NEGATIVE   Bilirubin Urine NEGATIVE NEGATIVE   Ketones, ur 5 (A) NEGATIVE mg/dL   Protein, ur NEGATIVE NEGATIVE mg/dL   Nitrite NEGATIVE NEGATIVE   Leukocytes,Ua NEGATIVE NEGATIVE    Comment: Performed at [facility] [address] [place],  [address]  Troponin I (High Sensitivity)     Status: None   Collection Time: 10/27/19  4:23 PM  Result Value Ref Range   Troponin I (High Sensitivity) 9 <18 ng/L    Comment: (NOTE) Elevated high sensitivity troponin I (hsTnI) values and significant   changes across serial measurements may suggest ACS but many other  chronic and acute conditions are known to elevate hsTnI results.  Refer to the "Links" section for chest pain algorithms and additional  guidance. Performed at [facility], [place] [address], [place], [place] [address]    Mr Brain 20 Contrast  Result Date: 10/27/2019 CLINICAL DATA:  78 year old male with near syncope. Weakness dizziness and diaphoresis. EXAM: MRI HEAD WITHOUT CONTRAST TECHNIQUE: Multiplanar, multiecho pulse sequences of the brain and surrounding structures were obtained without intravenous contrast. COMPARISON:  None. FINDINGS: Brain: There are several punctate foci of restricted diffusion in the anterior superior left parietal lobe near the perirolandic cortex (series 5, images 95 and 96). Mild if any associated T2 and FLAIR hyperintensity. No hemorrhage or mass effect. Nearby small area of chronic cortical encephalomalacia in the left motor strip on series 9, image 21. similar area in the more anterior left middle frontal gyrus on image 20. Possible chronic microhemorrhage in the left frontal lobe subcortical white matter on series 12, image 36, but otherwise there is normal for age gray and white matter signal aside from the above findings. No other restricted diffusion. No midline shift, mass effect, evidence of mass lesion, ventriculomegaly, extra-axial collection or acute intracranial hemorrhage. Cervicomedullary junction within normal limits. Partially empty sella. Vascular: Major intracranial vascular flow voids are preserved. The left vertebral artery appears dominant and the right diminutive. Skull and upper cervical spine: Negative for age visible cervical spine. Normal bone marrow signal. Sinuses/Orbits: Negative orbits. Paranasal sinuses are well pneumatized. Other: Mastoids are clear. Visible internal auditory structures appear normal. Negative scalp and face soft tissues. IMPRESSION: 1. Positive for  several punctate acute infarcts in the posterior left MCA territory, superimposed on several small chronic cortical infarcts in that region. No associated hemorrhage or mass effect. 2. Elsewhere largely unremarkable for age non-contrast MRI appearance of the brain. Electronically Signed   By: Genevie Ann M.D.   On: 10/27/2019 17:54    Pending Labs Unresulted Labs (From admission, onward)    Start     Ordered   10/27/19 1810  SARS CORONAVIRUS 2 (TAT 6-24 HRS) Nasopharyngeal Nasopharyngeal Swab  (Asymptomatic/Tier 2 Patients Labs)  Once,   STAT    Question Answer Comment  Is this test for diagnosis or screening Screening   Symptomatic for COVID-19 as defined by CDC No   Hospitalized for COVID-19 No   Admitted to ICU for COVID-19 No   Previously tested for COVID-19 Unknown   Resident in a congregate (group) care setting No   Employed in healthcare setting No      10/27/19 1809   Signed and Held  Hemoglobin A1c  Tomorrow morning,   R     Signed and Held   Signed and Held  Lipid panel  Tomorrow morning,   R    Comments: Fasting    Signed and Held   Signed and Held  CBC  Tomorrow morning,   R     Signed and Held   Signed and Held  Comprehensive metabolic panel  Tomorrow morning,   R     Signed and Held          Vitals/Pain Today's Vitals   10/27/19 1500 10/27/19 1600 10/27/19 1855 10/27/19 1901  BP: (!) 162/87 (!) 164/90 (!) 179/108   Pulse:  76 (!) 54   Resp: 18 16 (!) 23   Temp:      TempSrc:      SpO2:  99% 98%   Weight:      Height:      PainSc:    0-No pain    Isolation Precautions No active isolations  Medications Medications  sodium chloride flush (NS) 0.9 % injection 3 mL (3 mLs Intravenous Given 10/27/19 1254)    Mobility walks Low fall risk   Focused Assessments Neuro Assessment Handoff:  Swallow screen pass? Yes  Cardiac Rhythm: Atrial fibrillation NIH Stroke Scale ( + Modified Stroke Scale Criteria)  Interval: Initial Level of Consciousness (1a.)   :  Alert, keenly responsive LOC Questions (1b. )   +: Answers both questions correctly LOC Commands (1c. )   + : Performs both tasks correctly Best Gaze (2. )  +: Normal Visual (3. )  +: No visual loss Facial Palsy (4. )    : Normal symmetrical movements Motor Arm, Left (5a. )   +: No drift Motor Arm, Right (5b. )   +: No drift Motor Leg, Left (6a. )   +: No drift Motor Leg, Right (6b. )   +: No drift Limb Ataxia (7. ): Absent Sensory (8. )   +: Mild-to-moderate sensory loss, patient feels pinprick is less sharp or is dull on the affected side, or there is a loss of superficial pain with pinprick, but patient is aware of being touched Best Language (9. )   +: No aphasia Dysarthria (10. ): Normal Extinction/Inattention (11.)   +: No Abnormality Modified SS Total  +: 1 Complete NIHSS TOTAL: 1     Neuro Assessment: Within Defined Limits Neuro Checks:   Initial (10/27/19 1600)  Last Documented NIHSS Modified Score: 1 (10/27/19 1600) Has TPA been given? No If patient is a Neuro Trauma and patient is going to OR before floor call report to Start nurse: [phone] or [phone]     R Recommendations: See Admitting Provider Note  Report given to:   Additional Notes:

## 2019-10-27 NOTE — ED Provider Notes (Signed)
Patient signed out to me by G. Green, PA-C.  Please see previous notes for further history.  In brief, patient presenting for evaluation of leg weakness and memory deficit.  Patient went for a walk today, felt weak and his legs gave out on him.  This was bilateral.  He had trouble getting up to do his weakness.  When EMS arrived, patient could not answer questions such as what his phone number was or what year it was.  This has since resolved, and he remains alert and oriented.  No focal neurologic deficits noted on exam.  Case was discussed with neurology, who recommended MRI, negative can be discharged.  MRI shows acute strokes.  Patient has a history of A. fib, is on Xarelto and says he has been taking this as prescribed.  No missed doses.  Will consult with neurology and call medicine for admission.  Discussed with Dr. Jonelle Sidle from [facility] hospitalist service, patient to be admitted.  Discussed with Dr. Cheral Marker from neurology, who will consult on pt.    Franchot Heidelberg, PA-C 10/27/19 Eugenia Pancoast, MD 10/28/19 [phone]

## 2019-10-28 ENCOUNTER — Inpatient Hospital Stay (HOSPITAL_COMMUNITY): Payer: Medicare Other

## 2019-10-28 ENCOUNTER — Encounter (HOSPITAL_COMMUNITY): Payer: Self-pay | Admitting: *Deleted

## 2019-10-28 DIAGNOSIS — I361 Nonrheumatic tricuspid (valve) insufficiency: Secondary | ICD-10-CM

## 2019-10-28 DIAGNOSIS — I639 Cerebral infarction, unspecified: Secondary | ICD-10-CM

## 2019-10-28 DIAGNOSIS — I340 Nonrheumatic mitral (valve) insufficiency: Secondary | ICD-10-CM

## 2019-10-28 LAB — ECHOCARDIOGRAM COMPLETE
Height: 69 in
Weight: 4032 oz

## 2019-10-28 LAB — COMPREHENSIVE METABOLIC PANEL
ALT: 21 U/L (ref 0–44)
AST: 25 U/L (ref 15–41)
Albumin: 3.3 g/dL — ABNORMAL LOW (ref 3.5–5.0)
Alkaline Phosphatase: 58 U/L (ref 38–126)
Anion gap: 6 (ref 5–15)
BUN: 18 mg/dL (ref 8–23)
CO2: 29 mmol/L (ref 22–32)
Calcium: 8.7 mg/dL — ABNORMAL LOW (ref 8.9–10.3)
Chloride: 105 mmol/L (ref 98–111)
Creatinine, Ser: 1.19 mg/dL (ref 0.61–1.24)
GFR calc Af Amer: >60 mL/min (ref >60)
GFR calc non Af Amer: 59 mL/min — ABNORMAL LOW (ref >60)
Glucose, Bld: 121 mg/dL — ABNORMAL HIGH (ref 70–99)
Potassium: 3.1 mmol/L — ABNORMAL LOW (ref 3.5–5.1)
Sodium: 140 mmol/L (ref 135–145)
Total Bilirubin: 0.9 mg/dL (ref 0.3–1.2)
Total Protein: 5.6 g/dL — ABNORMAL LOW (ref 6.5–8.1)

## 2019-10-28 LAB — SARS CORONAVIRUS 2 (TAT 6-24 HRS): SARS Coronavirus 2: NEGATIVE

## 2019-10-28 LAB — LIPID PANEL
Cholesterol: 126 mg/dL (ref 0–200)
HDL: 38 mg/dL — ABNORMAL LOW (ref >40)
LDL Cholesterol: 73 mg/dL (ref 0–99)
Total CHOL/HDL Ratio: 3.3 RATIO
Triglycerides: 74 mg/dL (ref <150)
VLDL: 15 mg/dL (ref 0–40)

## 2019-10-28 LAB — HEMOGLOBIN A1C
Hgb A1c MFr Bld: 5.7 % — ABNORMAL HIGH (ref 4.8–5.6)
Mean Plasma Glucose: 116.89 mg/dL

## 2019-10-28 LAB — CBC
HCT: 40.5 % (ref 39.0–52.0)
Hemoglobin: 13.8 g/dL (ref 13.0–17.0)
MCH: 32.5 pg (ref 26.0–34.0)
MCHC: 34.1 g/dL (ref 30.0–36.0)
MCV: 95.5 fL (ref 80.0–100.0)
Platelets: 181 10*3/uL (ref 150–400)
RBC: 4.24 MIL/uL (ref 4.22–5.81)
RDW: 13.2 % (ref 11.5–15.5)
WBC: 5.2 10*3/uL (ref 4.0–10.5)
nRBC: 0 % (ref 0.0–0.2)

## 2019-10-28 MED ORDER — POTASSIUM CHLORIDE CRYS ER 20 MEQ PO TBCR
40.0000 meq | EXTENDED_RELEASE_TABLET | Freq: Once | ORAL | Status: AC
  Administered 2019-10-28: 09:00:00 40 meq via ORAL
  Filled 2019-10-28: qty 2

## 2019-10-28 MED ORDER — IOHEXOL 350 MG/ML SOLN
75.0000 mL | Freq: Once | INTRAVENOUS | Status: AC | PRN
  Administered 2019-10-28: 75 mL via INTRAVENOUS

## 2019-10-28 MED ORDER — APIXABAN 5 MG PO TABS: 5.0000 mg | ORAL_TABLET | Freq: Two times a day (BID) | ORAL | Status: DC

## 2019-10-28 NOTE — Progress Notes (Signed)
ANTICOAGULATION CONSULT NOTE - Initial Consult  Pharmacy Consult for Eliquis Indication: atrial fibrillation  Allergies  Allergen Reactions  . Peanuts [Peanut Oil] Anaphylaxis  . Eggs Or Egg-Derived Products Hives, Itching and Swelling    Patient Measurements: Height: 5\' 9"  (175.3 cm) Weight: 252 lb (114.3 kg) IBW/kg (Calculated) : 70.7  Vital Signs: Temp: 97.6 F (36.4 C) (11/08 1613) Temp Source: Oral (11/08 1613) BP: 155/85 (11/08 1613) Pulse Rate: 63 (11/08 1613)  Labs: Recent Labs    10/27/19 1300 10/27/19 1422 10/27/19 1623 10/28/19 0338  HGB 14.8  --   --  13.8  HCT 43.6  --   --  40.5  PLT 172  --   --  181  CREATININE 1.36*  --   --  1.19  CKTOTAL  --  85  --   --   TROPONINIHS  --  8 9  --     Estimated Creatinine Clearance: 64.8 mL/min (by C-G formula based on SCr of 1.19 mg/dL).   Medical History: Past Medical History:  Diagnosis Date  . Abdominal aortic aneurysm (AAA) ([place]) 2017   Per patient he has AAA and has regular scans with Cardiology following  . Atrial fibrillation ([place])    2008  . Cancer ([place])    skin  . Cholecystitis 10/2018  . Gout   . HTN (hypertension)   . Hyperlipidemia   . Nephrolithiasis   . Peripheral neuropathy   . Sleep apnea     Medications:  Scheduled:  . allopurinol  300 mg Oral QHS  . atenolol  25 mg Oral Daily  . atorvastatin  10 mg Oral QHS  . Melatonin  3 mg Oral QHS  . multivitamin with minerals  1 tablet Oral Daily  . omega-3 acid ethyl esters  1 g Oral Daily  . rivaroxaban  20 mg Oral Q supper  . vitamin C  500 mg Oral Daily  . vitamin E  1,000 Units Oral Daily    Assessment: 78 yo M previously on Xarelto for afib presented to ED with new CVA.  Per neuro recs will switch anticoagulation to Eliquis.  Last dose of Xarelto given 11/8 at 1755.  Goal of Therapy:  Therapeutic anticoagulation Monitor platelets by anticoagulation protocol: Yes   Plan:  Start Eliquis 5mg  PO BID - next dose due 11/9 at  1800 Monitor for s/sx of bleeding  [employer], Pharm.D., BCPS Clinical Pharmacist  **Pharmacist phone directory can now be found on [email or web address] ([email or web address]).  Listed under [place].  10/28/2019 6:31 PM

## 2019-10-28 NOTE — Progress Notes (Signed)
  Echocardiogram 2D Echocardiogram has been performed.  Tyler Sparks 10/28/2019, 3:32 PM

## 2019-10-28 NOTE — Progress Notes (Addendum)
PROGRESS NOTE    Christie Badami  [id number] DOB: [birth year] DOA: 10/27/2019 PCP: Velna Hatchet, MD   Brief Narrative: 78 year old male with history of A. fib, chronic anticoagulation with Xarelto, hypertension, hyperlipidemia, peripheral neuropathy, OSA presented after a fall falling feeling weak in bilateral lower extremities 11/7-and patient was confused at that time unable to give cardiac dates and his wife's phone number. He was brought to the ER underwent MRI of the brain that showed acute CVA neuro was consulted and is being admitted for further management.  Subjective:  Doing well this am reports on presentation  He had fall and had inability to remember his wife's  Phone now, butno nausea. Vomitting, chest pain. Worked with PT  Assessment & Plan:   Acute cerebrovascular accident with fall and confusion-MRI showing several punctate acute infarcts in the posterior left MCA territory, superimposed several small chronic cortical infarcts in that region.  Mental status currently stable.  Continue PT OT, stroke work-up as per neurology with carotid ultrasound, echocardiogram.  Hemoglobin A1c 5.7 at goal.  Lipid panel LDL 73, goal <70.  [facility]inea PT OT, telemetry monitoring.  Patient was on Xarelto despite that he sustained acute stroke neuro contemplating on switching to Eliquis.  Continue his Lipitor 10 mg.  Atrial fibrillation on xarelto-switching to Eliquis as per neurology.  Followed by [facility] health cardiology  Essential hypertension: Blood pressure fairly controlled-hold off his meds except atenolol for A. fib allow permissive hypertension  Hyperlipidemia- continue statin  OSA-continue CPAP at night.  Hypokalemia-repleted again. Addendum Discussed w Dr Jaynee Eagles re CTA brain findings- she advised to switch to eliquis and pharmacy consulted. Body mass index is 37.21 kg/m.   DVT prophylaxis: DOAC Code Status: FULL Family Communication: plan of care discussed with patient at  bedside. Lives with his wife/son. Disposition Plan: Remains inpatient pending clinical improvement and completion of stroke work up, once signed off by neurology and stable   Consultants:  neurology  Procedures:none  Microbiology: none  Antimicrobials: Anti-infectives (From admission, onward)   None       Objective: Vitals:   10/28/19 0719 10/28/19 0739 10/28/19 0926 10/28/19 1002  BP: (!) 141/96 (!) 150/79 (!) 153/80 130/73  Pulse: 62 65 61 (!) 50  Resp: 17 20  20   Temp:  97.6 F (36.4 C)  97.8 F (36.6 C)  TempSrc:  Oral  Oral  SpO2: 96% 98%  96%  Weight:      Height:        Intake/Output Summary (Last 24 hours) at 10/28/2019 1046 Last data filed at 10/28/2019 1022 Gross per 24 hour  Intake 836.21 ml  Output 200 ml  Net 636.21 ml   Filed Weights   10/27/19 1248  Weight: 114.3 kg   Weight change:   Body mass index is 37.21 kg/m.  Intake/Output from previous day: 11/07 0701 - 11/08 0700 In: 596.2 [P.O.:240; I.V.:356.2] Out: 200 [Urine:200] Intake/Output this shift: Total I/O In: 240 [P.O.:240] Out: -   Examination:  General exam: AAO,NAD, Weak appearing. HEENT:Oral mucosa moist, Ear/Nose WNL grossly, dentition normal. Respiratory system: Diminished at the base,no wheezing or crackles,no use of accessory muscle Cardiovascular system: S1 & S2 +, No JVD,. Gastrointestinal system: Abdomen soft, NT,ND, BS+ Nervous System:Alert, awake, oriented x3,moving extremities and has fair strength bilaterally  Extremities: No edema, distal peripheral pulses palpable.  Skin: No rashes,no icterus. MSK: Normal muscle bulk,tone, power.  Medications:  Scheduled Meds:  allopurinol  300 mg Oral QHS   atenolol  25 mg Oral Daily  atorvastatin  10 mg Oral QHS   diltiazem  180 mg Oral Daily   hydrochlorothiazide  12.5 mg Oral Daily   irbesartan  300 mg Oral Daily   Melatonin  3 mg Oral QHS   multivitamin with minerals  1 tablet Oral Daily   omega-3 acid  ethyl esters  1 g Oral Daily   rivaroxaban  20 mg Oral Q supper   vitamin C  500 mg Oral Daily   vitamin E  1,000 Units Oral Daily   Continuous Infusions:  sodium chloride 100 mL/hr at 10/28/19 0118    Data Reviewed: I have personally reviewed following labs and imaging studies  CBC: Recent Labs  Lab 10/27/19 1300 10/28/19 0338  WBC 3.4* 5.2  HGB 14.8 13.8  HCT 43.6 40.5  MCV 95.6 95.5  PLT 172 0000000   Basic Metabolic Panel: Recent Labs  Lab 10/27/19 1300 10/28/19 0338  NA 142 140  K 3.4* 3.1*  CL 105 105  CO2 26 29  GLUCOSE 149* 121*  BUN 27* 18  CREATININE 1.36* 1.19  CALCIUM 9.4 8.7*   GFR: Estimated Creatinine Clearance: 64.8 mL/min (by C-G formula based on SCr of 1.19 mg/dL). Liver Function Tests: Recent Labs  Lab 10/28/19 0338  AST 25  ALT 21  ALKPHOS 58  BILITOT 0.9  PROT 5.6*  ALBUMIN 3.3*   No results for input(s): LIPASE, AMYLASE in the last 168 hours. No results for input(s): AMMONIA in the last 168 hours. Coagulation Profile: No results for input(s): INR, PROTIME in the last 168 hours. Cardiac Enzymes: Recent Labs  Lab 10/27/19 1422  CKTOTAL 85   BNP (last 3 results) No results for input(s): PROBNP in the last 8760 hours. HbA1C: Recent Labs    10/28/19 0338  HGBA1C 5.7*   CBG: No results for input(s): GLUCAP in the last 168 hours. Lipid Profile: Recent Labs    10/28/19 0338  CHOL 126  HDL 38*  LDLCALC 73  TRIG 74  CHOLHDL 3.3   Thyroid Function Tests: No results for input(s): TSH, T4TOTAL, FREET4, T3FREE, THYROIDAB in the last 72 hours. Anemia Panel: No results for input(s): VITAMINB12, FOLATE, FERRITIN, TIBC, IRON, RETICCTPCT in the last 72 hours. Sepsis Labs: No results for input(s): PROCALCITON, LATICACIDVEN in the last 168 hours.  Recent Results (from the past 240 hour(s))  SARS CORONAVIRUS 2 (TAT 6-24 HRS) Nasopharyngeal Nasopharyngeal Swab     Status: None   Collection Time: 10/27/19  6:54 PM   Specimen:  Nasopharyngeal Swab  Result Value Ref Range Status   SARS Coronavirus 2 NEGATIVE NEGATIVE Final    Comment: (NOTE) SARS-CoV-2 target nucleic acids are NOT DETECTED. The SARS-CoV-2 RNA is generally detectable in upper and lower respiratory specimens during the acute phase of infection. Negative results do not preclude SARS-CoV-2 infection, do not rule out co-infections with other pathogens, and should not be used as the sole basis for treatment or other patient management decisions. Negative results must be combined with clinical observations, patient history, and epidemiological information. The expected result is Negative. Fact Sheet for Patients: SugarRoll.be Fact Sheet for Healthcare Providers: https://www.woods-mathews.com/ This test is not yet approved or cleared by the [place] FDA and  has been authorized for detection and/or diagnosis of SARS-CoV-2 by FDA under an Emergency Use Authorization (EUA). This EUA will remain  in effect (meaning this test can be used) for the duration of the COVID-19 declaration under Section 56 4(b)(1) of the Act, 21 U.S.C. section 360bbb-3(b)(1), unless the authorization  is terminated or revoked sooner. Performed at [facility], [place] [address], [place], [place] [address]       Radiology Studies: Mr Brain 50 Contrast  Result Date: 10/27/2019 CLINICAL DATA:  78 year old male with near syncope. Weakness dizziness and diaphoresis. EXAM: MRI HEAD WITHOUT CONTRAST TECHNIQUE: Multiplanar, multiecho pulse sequences of the brain and surrounding structures were obtained without intravenous contrast. COMPARISON:  None. FINDINGS: Brain: There are several punctate foci of restricted diffusion in the anterior superior left parietal lobe near the perirolandic cortex (series 5, images 95 and 96). Mild if any associated T2 and FLAIR hyperintensity. No hemorrhage or mass effect. Nearby small area of chronic  cortical encephalomalacia in the left motor strip on series 9, image 21. similar area in the more anterior left middle frontal gyrus on image 20. Possible chronic microhemorrhage in the left frontal lobe subcortical white matter on series 12, image 36, but otherwise there is normal for age gray and white matter signal aside from the above findings. No other restricted diffusion. No midline shift, mass effect, evidence of mass lesion, ventriculomegaly, extra-axial collection or acute intracranial hemorrhage. Cervicomedullary junction within normal limits. Partially empty sella. Vascular: Major intracranial vascular flow voids are preserved. The left vertebral artery appears dominant and the right diminutive. Skull and upper cervical spine: Negative for age visible cervical spine. Normal bone marrow signal. Sinuses/Orbits: Negative orbits. Paranasal sinuses are well pneumatized. Other: Mastoids are clear. Visible internal auditory structures appear normal. Negative scalp and face soft tissues. IMPRESSION: 1. Positive for several punctate acute infarcts in the posterior left MCA territory, superimposed on several small chronic cortical infarcts in that region. No associated hemorrhage or mass effect. 2. Elsewhere largely unremarkable for age non-contrast MRI appearance of the brain. Electronically Signed   By: Genevie Ann M.D.   On: 10/27/2019 17:54      LOS: 1 day   Time spent: More than 50% of that time was spent in counseling and/or coordination of care.  Antonieta Pert, MD [facility]  10/28/2019, 10:46 AM

## 2019-10-28 NOTE — Evaluation (Signed)
Speech Language Pathology Evaluation Patient Details Name: Tyler Sparks MRN: [id number] DOB: Aug 01, 1941 Today's Date: 10/28/2019 Time: [id number] SLP Time Calculation (min) (ACUTE ONLY): 29 min  Problem List:  Patient Active Problem List   Diagnosis Date Noted  . Acute cerebrovascular accident (CVA) ([place]) 10/27/2019  . OSA (obstructive sleep apnea) 10/27/2019  . Essential hypertension 11/01/2018  . AKI (acute kidney injury) ([place]) 11/01/2018  . Hyperlipidemia 11/01/2018  . Cholecystitis 10/30/2018  . Peripheral neuropathy 07/22/2014  . Atrial fibrillation ([place]) 06/26/2014   Past Medical History:  Past Medical History:  Diagnosis Date  . Abdominal aortic aneurysm (AAA) ([place]) 2017   Per patient he has AAA and has regular scans with Cardiology following  . Atrial fibrillation ([place])    2008  . Cancer ([place])    skin  . Cholecystitis 10/2018  . Gout   . HTN (hypertension)   . Hyperlipidemia   . Nephrolithiasis   . Peripheral neuropathy   . Sleep apnea    Past Surgical History:  Past Surgical History:  Procedure Laterality Date  . ARM SKIN LESION BIOPSY / EXCISION Right 8 15  . CHOLECYSTECTOMY N/A 11/01/2018   Procedure: LAPAROSCOPIC CHOLECYSTECTOMY;  Surgeon: Clovis Riley, MD;  Location: [facility];  Service: General;  Laterality: N/A;  . [place]  . SKIN CANCER EXCISION  2008, 07/2014  . TONSILECTOMY/ADENOIDECTOMY WITH MYRINGOTOMY  1950  . WISDOM TOOTH EXTRACTION     HPI:  Tyler Sparks is a 78 y.o. male with medical history significant of atrial fibrillation, cholecystitis, chronic anticoagulation with Xarelto, hypertension, hyperlipidemia, peripheral neuropathy and obstructive sleep apnea who presented today with a fall after feeling weak in bilateral lower extremities today.  He was walking in the neighborhood when he started feeling the weakness bilaterally followed by the fall.  Patient subsequently had EMS arrived.  At the time he realized he was confused.   Was unable to give correct dates.  He could also not recall some recent events.  Patient was brought into the ER where he was initially evaluated for the fall.  Later MRI was done showing acute CVA.  Patient denied losing consciousness denied hitting his head.  He laid on the ground for couple of minutes after he fell.  He did feel some dizzy as well as diaphoretic.  He has been on chronic anticoagulation for his A. fib and he has been consistent with all his medications.  At this point patient's symptoms have reverted to normal.  MRI of the brain was showing several punctate acute infarcts in the posterior left MCA territory, superimposed on several small chronic cortical   Assessment / Plan / Recommendation Clinical Impression  Cognitive/linguistic and motor speech evaluation was completed.  Cranial nerve exam was completed and unremarkable.  Lingual, labial, facial and jaw range of motion and strength appeared to be adequate.  Facial sensation appeared to be intact.  Speech was clear and easy to understand.  No discernible dyarthria or apraxia was noted.  He achieved an overall score of 28/30 on the Mini Mental State Exam suggesting functional cognitive/linguistic skills.  He was oriented to person, place, time and situation.  Speech was fluent and he easily discussed why he was in the hospital.  Attention to task was good and language skills appeared to be intact.  Immediate recall of three novel words was good, however, given a short delay he was only able to recall 2/3 words.  Use of semantic cue did facilitate recall.  He was  able to provide logical solutions to simple problems and complete the clock drawing task.  Given results ST follow up is not indicated.     Procedure:  The Mini Mental State Exam was administered to assess the patient's cognitive/linguistic skills.  Results were as follows:  ORIENTATION:  10/10 REGISTRATION:  3/3 ATTENTION:  5/5 RECALL:   2/3 LANGUAGE:  8/9  TOTAL SCORE:   28/30 Norms:  24/30 and above considered functional      SLP Assessment  SLP Recommendation/Assessment: Patient does not need any further Speech Lanaguage Pathology Services    Follow Up Recommendations  None          SLP Evaluation Cognition  Overall Cognitive Status: Within Functional Limits for tasks assessed Arousal/Alertness: Awake/alert Orientation Level: Oriented X4 Attention: Sustained Sustained Attention: Appears intact Memory: Appears intact Awareness: Appears intact Problem Solving: Appears intact Safety/Judgment: Appears intact       Comprehension  Auditory Comprehension Overall Auditory Comprehension: Appears within functional limits for tasks assessed Commands: Within Functional Limits Conversation: Complex Reading Comprehension Reading Status: Within funtional limits    Expression Expression Primary Mode of Expression: Verbal Verbal Expression Overall Verbal Expression: Appears within functional limits for tasks assessed Initiation: No impairment Automatic Speech: Name;Social Response Level of Generative/Spontaneous Verbalization: Conversation Repetition: No impairment Naming: No impairment Pragmatics: No impairment Non-Verbal Means of Communication: Not applicable Written Expression Dominant Hand: Right Written Expression: Within Functional Limits   Oral / Motor  Oral Motor/Sensory Function Overall Oral Motor/Sensory Function: Within functional limits Motor Speech Overall Motor Speech: Appears within functional limits for tasks assessed Respiration: Within functional limits Phonation: Normal Resonance: Within functional limits Articulation: Within functional limitis Intelligibility: Intelligible Motor Planning: Witnin functional limits Motor Speech Errors: Not applicable   GO                    Tyler Sparks 10/28/2019, 1:28 PM

## 2019-10-28 NOTE — Progress Notes (Signed)
STROKE TEAM PROGRESS NOTE  Tyler Sparks is an 78 y.o. male presenting to EMS after having a fall while walking in his neighborhood this AM. The fall was preceded by a feeling of his legs feeling unusually weak, followed by his legs giving out from underneath him. He fell down to one knee without head trauma. He could not get back up and was on the ground for several minutes prior to being assisted back to his feet. He endorsed to Triage RN that he suddenly became dizzy, diaphoretic and pale just prior to the fall, but did not lose consciousness. In the ED, MRI brain was obtained revealing 2 acute, punctate, cortically-based infarctions in the left parietal lobe.   His PMHx includes a-fib on Eliquis, HTN, HLD, a 30 year history of bilateral peripheral neuropathy, skin cancer, gout and sleep apnea.   INTERVAL HISTORY His family is not at bedside. Discussed Xarelto failure with patient and recommended switch to eliquis and he was in agreement.  Vitals:   10/28/19 0719 10/28/19 0739 10/28/19 0926 10/28/19 1002  BP: (!) 141/96 (!) 150/79 (!) 153/80 130/73  Pulse: 62 65 61 (!) 50  Resp: 17 20  20   Temp:  97.6 F (36.4 C)  97.8 F (36.6 C)  TempSrc:  Oral  Oral  SpO2: 96% 98%  96%  Weight:      Height:        CBC:  Recent Labs  Lab 10/27/19 1300 10/28/19 0338  WBC 3.4* 5.2  HGB 14.8 13.8  HCT 43.6 40.5  MCV 95.6 95.5  PLT 172 0000000    Basic Metabolic Panel:  Recent Labs  Lab 10/27/19 1300 10/28/19 0338  NA 142 140  K 3.4* 3.1*  CL 105 105  CO2 26 29  GLUCOSE 149* 121*  BUN 27* 18  CREATININE 1.36* 1.19  CALCIUM 9.4 8.7*   Lipid Panel:     Component Value Date/Time   CHOL 126 10/28/2019 0338   TRIG 74 10/28/2019 0338   HDL 38 (L) 10/28/2019 0338   CHOLHDL 3.3 10/28/2019 0338   VLDL 15 10/28/2019 0338   LDLCALC 73 10/28/2019 0338   HgbA1c:  Lab Results  Component Value Date   HGBA1C 5.7 (H) 10/28/2019   Urine Drug Screen: No results found for: LABOPIA,  COCAINSCRNUR, LABBENZ, AMPHETMU, THCU, LABBARB  Alcohol Level No results found for: [facility]  IMAGING Mr Brain Wo Contrast  Result Date: 10/27/2019 CLINICAL DATA:  78 year old male with near syncope. Weakness dizziness and diaphoresis. EXAM: MRI HEAD WITHOUT CONTRAST TECHNIQUE: Multiplanar, multiecho pulse sequences of the brain and surrounding structures were obtained without intravenous contrast. COMPARISON:  None. FINDINGS: Brain: There are several punctate foci of restricted diffusion in the anterior superior left parietal lobe near the perirolandic cortex (series 5, images 95 and 96). Mild if any associated T2 and FLAIR hyperintensity. No hemorrhage or mass effect. Nearby small area of chronic cortical encephalomalacia in the left motor strip on series 9, image 21. similar area in the more anterior left middle frontal gyrus on image 20. Possible chronic microhemorrhage in the left frontal lobe subcortical white matter on series 12, image 36, but otherwise there is normal for age gray and white matter signal aside from the above findings. No other restricted diffusion. No midline shift, mass effect, evidence of mass lesion, ventriculomegaly, extra-axial collection or acute intracranial hemorrhage. Cervicomedullary junction within normal limits. Partially empty sella. Vascular: Major intracranial vascular flow voids are preserved. The left vertebral artery appears dominant and the  right diminutive. Skull and upper cervical spine: Negative for age visible cervical spine. Normal bone marrow signal. Sinuses/Orbits: Negative orbits. Paranasal sinuses are well pneumatized. Other: Mastoids are clear. Visible internal auditory structures appear normal. Negative scalp and face soft tissues. IMPRESSION: 1. Positive for several punctate acute infarcts in the posterior left MCA territory, superimposed on several small chronic cortical infarcts in that region. No associated hemorrhage or mass effect. 2. Elsewhere largely  unremarkable for age non-contrast MRI appearance of the brain. Electronically Signed   By: Genevie Ann M.D.   On: 10/27/2019 17:54    PHYSICAL EXAM  Exam: NAD, pleasant                  Speech:    Speech is normal; fluent and spontaneous with normal comprehension.  Cognition:    The patient is oriented to person, place, and time;     recent and remote memory intact;     language fluent;    Cranial Nerves:    The pupils are equal, round, and reactive to light.Trigeminal sensation is intact and the muscles of mastication are normal. The face is symmetric. The palate elevates in the midline. Hearing intact. Voice is normal. Shoulder shrug is normal. The tongue has normal motion without fasciculations.   Coordination:  No dysmetria  Motor Observation:    No asymmetry, no atrophy, and no involuntary movements noted. Tone:    Normal muscle tone.     Strength:    Strength is V/V in the upper and lower limbs.      Sensation: intact to LT     ASSESSMENT/PLAN [name] is a 78 y.o. male with history of -fib on Xarelto (taking properly and compliant), HTN, HLD, a 30 year history of bilateral peripheral neuropathy, skin cancer, gout and sleep apnea.presenting with embolic strokes   Stroke: embolic secondary to afib with Xarelto Failure  MRI  - punctate acute infarcts in the posterior left MCA territory, superimposed on several small chronic cortical infarcts in that region  MRA not ordered, pending CTA  Carotid Doppler  pending  2D Echo pending  LDL 73  HgbA1c 5.7    Diet   Diet Heart Room service appropriate? Yes; Fluid consistency: Thin     Xarelt prior to admission, now on Xarelto. Pending completed workup consider switching to Eliquis due to Xarelto failure  Therapy recommendations:  pending  Disposition:  pending  Hypertension . stable . Long-term BP goal normotensive  Hyperlipidemia  Home meds:  resumed in hospital  LDL 73, goal < 70  Continue  statin at discharge  Diabetes type II Controlled  HgbA1c 5.7, goal < 7.0  CBGs  SSI  Other Stroke Risk Factors  Advanced age  Obesity, Body mass index is 37.21 kg/m., recommend weight loss, diet and exercise as appropriate   OSA   Hospital day # 1   Personally examined patient and images, and have participated in and made any corrections needed to history, physical, neuro exam,assessment and plan as stated above.  I have personally obtained the history, evaluated lab date, reviewed imaging studies and agree with radiology interpretations.    Sarina Ill, MD Stroke Neurology   A total of 25 minutes was spent for the care of this patient, spent on counseling patient and family on different diagnostic and therapeutic options, counseling and coordination of care, riskd ans benefits of management, compliance, or risk factor reduction and education.   To contact Stroke Continuity provider, please refer to [email or web address]  After hours, contact General Neurology

## 2019-10-28 NOTE — Progress Notes (Signed)
VASCULAR LAB PRELIMINARY  PRELIMINARY  PRELIMINARY  PRELIMINARY  Carotid duplex completed.    Preliminary report:  See CV proc for preliminary report.  Montez Cuda, RVT 10/28/2019, 12:35 PM

## 2019-10-28 NOTE — Progress Notes (Signed)
Occupational Therapy Evaluation Patient Details Name: Tyler Sparks MRN: [id number] DOB: 27-Nov-1941 Today's Date: 10/28/2019    History of Present Illness Tyler Sparks is a 78 y.o. male with medical history significant of atrial fibrillation, cholecystitis, chronic anticoagulation with Xarelto, hypertension, hyperlipidemia, peripheral neuropathy and obstructive sleep apnea who presented with a fall after feeling weak in bilateral lower extremities. MRI was positive for several punctate acute infarcts in the posterior left MCA territory.   Clinical Impression   PTA, pt lived in two story home with wife and son, and reports independence of all ADLs, IADLs,and driving. Pt currently presents with decreased strength and knowledge of stroke signs and symptoms. Pt required min guard A for LB dressing and oral care standing at sink. Provided education regarding stroke signs and symptoms; pt verbalized understanding. Recommend dc home with no OT follow up once medically stable per MD. All education and acute needs met, will sign off.     Follow Up Recommendations  No OT follow up;Supervision/Assistance - 24 hour    Equipment Recommendations  None recommended by OT    Recommendations for Other Services PT consult     Precautions / Restrictions Precautions Precautions: Fall Restrictions Weight Bearing Restrictions: No      Mobility Bed Mobility Overal bed mobility: Modified Independent             General bed mobility comments: pt required increased time to come to sitting EOB  Transfers Overall transfer level: Needs assistance Equipment used: None Transfers: Sit to/from Stand Sit to Stand: Min guard         General transfer comment: Pt required min guard A for safety and balance     Balance Overall balance assessment: Mild deficits observed, not formally tested                                         ADL either performed or assessed with clinical  judgement   ADL Overall ADL's : Needs assistance/impaired Eating/Feeding: Sitting;Independent   Grooming: Oral care;Min guard;Standing Grooming Details (indicate cue type and reason): Pt performed oral care standing at sink with min guard A for safety.  Upper Body Bathing: Supervision/ safety;Sitting   Lower Body Bathing: Min guard;Sit to/from stand   Upper Body Dressing : Supervision/safety;Sitting   Lower Body Dressing: Min guard;Sit to/from stand Lower Body Dressing Details (indicate cue type and reason): Pt demonstrated figure 4 method for donning socks sitting EOB, required min gaurd A for safety Toilet Transfer: Min guard;Ambulation(simulated to recliner)   Toileting- Clothing Manipulation and Hygiene: Min guard;Sit to/from stand       Functional mobility during ADLs: Min guard General ADL Comments: Pt requires supervision- min guard A for all UB and LB ADLs.     Vision Baseline Vision/History: Wears glasses Wears Glasses: At all times Vision Assessment?: No apparent visual deficits     Perception     Praxis      Pertinent Vitals/Pain Pain Assessment: No/denies pain     Hand Dominance Right   Extremity/Trunk Assessment Upper Extremity Assessment Upper Extremity Assessment: Overall WFL for tasks assessed   Lower Extremity Assessment Lower Extremity Assessment: Defer to PT evaluation   Cervical / Trunk Assessment Cervical / Trunk Assessment: Normal   Communication Communication Communication: No difficulties   Cognition Arousal/Alertness: Awake/alert Behavior During Therapy: WFL for tasks assessed/performed Overall Cognitive Status: Within Functional Limits for tasks assessed  General Comments: Pt telling long story of what brought him into hospital throughout session. Pt demonstrating awareness of deficits, and answered all questions appropriately.    General Comments  VSS throughout, pt very talkative  throughout session. Provided education on stroke signs and symptoms; pt verbalized understanding.    Exercises     Shoulder Instructions      Home Living Family/patient expects to be discharged to:: Private residence Living Arrangements: Spouse/significant other;Children Available Help at Discharge: Family;Available 24 hours/day Type of Home: House Home Access: Stairs to enter     Home Layout: Two level;Able to live on main level with bedroom/bathroom(Office upstairs) Alternate Level Stairs-Number of Steps: flight   Bathroom Shower/Tub: Occupational psychologist: Handicapped height     Home Equipment: Shower seat          Prior Functioning/Environment Level of Independence: Independent        Comments: Pt independent for all ADLs, IADLs, and driving.         OT Problem List: Decreased strength;Decreased range of motion;Decreased activity tolerance;Impaired balance (sitting and/or standing)      OT Treatment/Interventions:      OT Goals(Current goals can be found in the care plan section) Acute Rehab OT Goals Patient Stated Goal: Be able to drive again OT Goal Formulation: All assessment and education complete, DC therapy  OT Frequency:     Barriers to D/C:            Co-evaluation              AM-PAC OT "6 Clicks" Daily Activity     Outcome Measure Help from another person eating meals?: None Help from another person taking care of personal grooming?: A Little Help from another person toileting, which includes using toliet, bedpan, or urinal?: A Little Help from another person bathing (including washing, rinsing, drying)?: A Little Help from another person to put on and taking off regular upper body clothing?: None Help from another person to put on and taking off regular lower body clothing?: A Little 6 Click Score: 20   End of Session Equipment Utilized During Treatment: Gait belt Nurse Communication: Mobility status  Activity  Tolerance: Patient tolerated treatment well Patient left: in chair;with call bell/phone within reach;with chair alarm set  OT Visit Diagnosis: History of falling (Z91.81);Muscle weakness (generalized) (M62.81)                Time: 4585-9292 OT Time Calculation (min): 22 min Charges:  OT General Charges $OT Visit: 1 Visit OT Evaluation $OT Eval Moderate Complexity: 1 Mod  Gus Rankin, OT Student  Gus Rankin 10/28/2019, 10:26 AM

## 2019-10-28 NOTE — Evaluation (Signed)
Physical Therapy Evaluation and Discharge Patient Details Name: Tyler Sparks MRN: [id number] DOB: June 16, 1941 Today's Date: 10/28/2019   History of Present Illness  Tyler Sparks is a 78 y.o. male with medical history significant of atrial fibrillation, cholecystitis, chronic anticoagulation with Xarelto, hypertension, hyperlipidemia, peripheral neuropathy and obstructive sleep apnea who presented with a fall after feeling weak in bilateral lower extremities. MRI was positive for several punctate acute infarcts in the posterior left MCA territory.  Clinical Impression  Pt admitted with above.Prior to admission, pt is independent and active, enjoys taking 3 mile walks every other day. On PT evaluation, pt ambulating hallway distances without difficulty. Scoring 20/24 on Dynamic Gait Index, indicating he is not at high risk for falls. Education provided re: proper footwear in setting of peripheral neuropathy, daily foot inspections, exercise recommendations. Pt verbalizing understanding. No further acute PT or follow up needs. PT signing off.     Follow Up Recommendations No PT follow up    Equipment Recommendations  None recommended by PT    Recommendations for Other Services       Precautions / Restrictions Precautions Precautions: Fall Restrictions Weight Bearing Restrictions: No      Mobility  Bed Mobility Overal bed mobility: Modified Independent             General bed mobility comments: pt required increased time to come to sitting EOB  Transfers Overall transfer level: Modified independent Equipment used: None Transfers: Sit to/from Stand Sit to Stand: Min guard         General transfer comment: Pt required min guard A for safety and balance   Ambulation/Gait Ambulation/Gait assistance: Modified independent (Device/Increase time) Gait Distance (Feet): 300 Feet Assistive device: None Gait Pattern/deviations: Step-through pattern;Decreased dorsiflexion -  right;Decreased dorsiflexion - left     General Gait Details: Pt with no evidence of gross instability, able to perform high level balance activities without assist  Stairs            Wheelchair Mobility    Modified Rankin (Stroke Patients Only) Modified Rankin (Stroke Patients Only) Pre-Morbid Rankin Score: No symptoms Modified Rankin: No significant disability     Balance Overall balance assessment: Mild deficits observed, not formally tested                               Standardized Balance Assessment Standardized Balance Assessment : Dynamic Gait Index   Dynamic Gait Index Level Surface: Mild Impairment Change in Gait Speed: Mild Impairment Gait with Horizontal Head Turns: Normal Gait with Vertical Head Turns: Normal Gait and Pivot Turn: Normal Step Over Obstacle: Mild Impairment Step Around Obstacles: Normal Steps: Mild Impairment Total Score: 20       Pertinent Vitals/Pain Pain Assessment: No/denies pain    Home Living Family/patient expects to be discharged to:: Private residence Living Arrangements: Spouse/significant other;Children Available Help at Discharge: Family;Available 24 hours/day Type of Home: House Home Access: Stairs to enter     Home Layout: Two level;Able to live on main level with bedroom/bathroom(Office upstairs) Home Equipment: Shower seat      Prior Function Level of Independence: Independent         Comments: Pt independent for all ADLs, IADLs, and driving.      Hand Dominance   Dominant Hand: Right    Extremity/Trunk Assessment   Upper Extremity Assessment Upper Extremity Assessment: Overall WFL for tasks assessed    Lower Extremity Assessment Lower Extremity Assessment: RLE deficits/detail;LLE  deficits/detail RLE Deficits / Details: Strength 5/5 RLE Sensation: history of peripheral neuropathy LLE Deficits / Details: Strength 5/5 LLE Sensation: history of peripheral neuropathy    Cervical /  Trunk Assessment Cervical / Trunk Assessment: Normal  Communication   Communication: No difficulties  Cognition Arousal/Alertness: Awake/alert Behavior During Therapy: WFL for tasks assessed/performed Overall Cognitive Status: Within Functional Limits for tasks assessed                                 General Comments: Pt telling long story of what brought him into hospital throughout session. Pt demonstrating awareness of deficits, and answered all questions appropriately.       General Comments General comments (skin integrity, edema, etc.): VSS throughout, pt very talkative throughout session. Provided education on stroke signs and symptoms; pt verbalized understanding.    Exercises     Assessment/Plan    PT Assessment Patent does not need any further PT services  PT Problem List Decreased balance;Decreased mobility       PT Treatment Interventions      PT Goals (Current goals can be found in the Care Plan section)  Acute Rehab PT Goals Patient Stated Goal: Be able to drive again PT Goal Formulation: All assessment and education complete, DC therapy    Frequency     Barriers to discharge        Co-evaluation               AM-PAC PT "6 Clicks" Mobility  Outcome Measure Help needed turning from your back to your side while in a flat bed without using bedrails?: None Help needed moving from lying on your back to sitting on the side of a flat bed without using bedrails?: None Help needed moving to and from a bed to a chair (including a wheelchair)?: None Help needed standing up from a chair using your arms (e.g., wheelchair or bedside chair)?: None Help needed to walk in hospital room?: None Help needed climbing 3-5 steps with a railing? : None 6 Click Score: 24    End of Session   Activity Tolerance: Patient tolerated treatment well Patient left: in bed;with call bell/phone within reach;with bed alarm set   PT Visit Diagnosis: Other symptoms  and signs involving the nervous system (R29.898)    Time: [id number] PT Time Calculation (min) (ACUTE ONLY): 17 min   Charges:   PT Evaluation $PT Eval Low Complexity: 1 Low          Ellamae Sia, PT, DPT Acute Rehabilitation Services Pager [phone] Office [phone]   Willy Eddy 10/28/2019, 12:59 PM

## 2019-10-29 LAB — BASIC METABOLIC PANEL
Anion gap: 8 (ref 5–15)
BUN: 17 mg/dL (ref 8–23)
CO2: 26 mmol/L (ref 22–32)
Calcium: 8.7 mg/dL — ABNORMAL LOW (ref 8.9–10.3)
Chloride: 106 mmol/L (ref 98–111)
Creatinine, Ser: 1.36 mg/dL — ABNORMAL HIGH (ref 0.61–1.24)
GFR calc Af Amer: 58 mL/min — ABNORMAL LOW (ref >60)
GFR calc non Af Amer: 50 mL/min — ABNORMAL LOW (ref >60)
Glucose, Bld: 102 mg/dL — ABNORMAL HIGH (ref 70–99)
Potassium: 3.5 mmol/L (ref 3.5–5.1)
Sodium: 140 mmol/L (ref 135–145)

## 2019-10-29 MED ORDER — APIXABAN 5 MG PO TABS: 5.0000 mg | ORAL_TABLET | Freq: Two times a day (BID) | ORAL | 1 refills | Status: DC

## 2019-10-29 MED ORDER — APIXABAN 5 MG PO TABS: 5.0000 mg | ORAL_TABLET | Freq: Two times a day (BID) | ORAL | 0 refills | Status: DC

## 2019-10-29 MED FILL — ELIQUIS 5 MG TABLET: 5 | 30 days supply | Qty: 60 | Fill #0

## 2019-10-29 NOTE — Progress Notes (Signed)
NURSING PROGRESS NOTE  Tyler Sparks [id number] Discharge Data: 10/29/2019 12:00 p.m. Attending Provider: No att. providers found [id number], Nicki Reaper, MD     Nelida Gores to be D/C'd Home per MD order.  Discussed with the patient the After Visit Summary and all questions fully answered. All IV's discontinued with no bleeding noted. All belongings returned to patient for patient to take home.   Last Vital Signs:  Blood pressure (!) 153/92, pulse 65, temperature 97.6 F (36.4 C), temperature source Oral, resp. rate 18, height 5\' 9"  (1.753 m), weight 114.3 kg, SpO2 100 %.  Discharge Medication List Allergies as of 10/29/2019      Reactions   Peanuts [peanut Oil] Anaphylaxis   Eggs Or Egg-derived Products Hives, Itching, Swelling      Medication List    STOP taking these medications   Xarelto 20 MG Tabs tablet Generic drug: rivaroxaban     TAKE these medications   allopurinol 300 MG tablet Commonly known as: ZYLOPRIM Take 300 mg by mouth at bedtime. Notes to patient: 11/9 bedtime   apixaban 5 MG Tabs tablet Commonly known as: ELIQUIS Take 1 tablet (5 mg total) by mouth 2 (two) times daily.   atenolol 25 MG tablet Commonly known as: TENORMIN Take 25 mg by mouth daily. Notes to patient: 11/9 morning   atorvastatin 10 MG tablet Commonly known as: LIPITOR Take 10 mg by mouth at bedtime. Notes to patient: 11/9 bedtime   co-enzyme Q-10 50 MG capsule Take 50 mg by mouth daily.   colchicine 0.6 MG tablet Take 0.6 mg by mouth daily as needed (FOR GOUT FLARES).   diltiazem 180 MG 24 hr capsule Commonly known as: CARDIZEM CD Take 180 mg by mouth daily.   Fish Oil 1000 MG Caps Take 1,000 mg by mouth daily. Notes to patient: 11/10 am   Melatonin 3 MG Caps Take 3 mg by mouth at bedtime.   multivitamin tablet Take 1 tablet by mouth daily. Notes to patient: 11/10 am   OSTEO BI-FLEX JOINT SHIELD PO Take 1 tablet by mouth 2 (two) times daily.    valsartan-hydrochlorothiazide 320-12.5 MG tablet Commonly known as: DIOVAN-HCT Take 1 tablet by mouth daily. Notes to patient: 11/10 am   vitamin C 500 MG tablet Commonly known as: ASCORBIC ACID Take 500 mg by mouth daily. Notes to patient: 11/10 am   vitamin E 1000 UNIT capsule Take 1,000 Units by mouth daily. Notes to patient: 11/10 am

## 2019-10-29 NOTE — Progress Notes (Signed)
STROKE TEAM PROGRESS NOTE     INTERVAL HISTORY His family is not at bedside I have personally reviewed history of presenting illness with the patient and imaging films in PACS and answered questions.  Patient states he is back to baseline.  He was being compliant with his Xarelto but was not taking it exactly at the same time every day.  He is agreeable to switching to Eliquis. Vitals:   10/28/19 2101 10/29/19 0552 10/29/19 0800 10/29/19 0940  BP: 131/63 118/78 (!) 149/75 (!) 153/92  Pulse: 60 (!) 50 65   Resp: 18 18    Temp: 97.9 F (36.6 C) 97.8 F (36.6 C) 97.6 F (36.4 C)   TempSrc: Oral  Oral   SpO2: 97% 100% 100%   Weight:      Height:        CBC:  Recent Labs  Lab 10/27/19 1300 10/28/19 0338  WBC 3.4* 5.2  HGB 14.8 13.8  HCT 43.6 40.5  MCV 95.6 95.5  PLT 172 0000000    Basic Metabolic Panel:  Recent Labs  Lab 10/28/19 0338 10/29/19 0324  NA 140 140  K 3.1* 3.5  CL 105 106  CO2 29 26  GLUCOSE 121* 102*  BUN 18 17  CREATININE 1.19 1.36*  CALCIUM 8.7* 8.7*   Lipid Panel:     Component Value Date/Time   CHOL 126 10/28/2019 0338   TRIG 74 10/28/2019 0338   HDL 38 (L) 10/28/2019 0338   CHOLHDL 3.3 10/28/2019 0338   VLDL 15 10/28/2019 0338   LDLCALC 73 10/28/2019 0338   HgbA1c:  Lab Results  Component Value Date   HGBA1C 5.7 (H) 10/28/2019   Urine Drug Screen: No results found for: LABOPIA, COCAINSCRNUR, LABBENZ, AMPHETMU, THCU, LABBARB  Alcohol Level No results found for: ETH  IMAGING Ct Angio Head W Or Wo Contrast  Result Date: 10/28/2019 CLINICAL DATA:  Cardiac thrombus or embolic source suspected. EXAM: CT ANGIOGRAPHY HEAD TECHNIQUE: Multidetector CT imaging of the head was performed using the standard protocol during bolus administration of intravenous contrast. Multiplanar CT image reconstructions and MIPs were obtained to evaluate the vascular anatomy. CONTRAST:  39mL OMNIPAQUE IOHEXOL 350 MG/ML SOLN COMPARISON:  Brain MRI 10/27/2019, FINDINGS: CT  HEAD Brain: Again demonstrated are small chronic infarcts within the left frontal lobe. Tiny acute infarcts within the high left parietal lobe were better appreciated on prior MRI 10/27/2019. No new acute infarct is identified. No evidence of intracranial hemorrhage. No midline shift or extra-axial fluid collection. Partially empty sella turcica. Vascular: Reported separately. Skull: Normal. Negative for fracture or focal lesion. Sinuses: Mild ethmoid sinus mucosal thickening. No significant mastoid effusion. Orbits: Visualized orbits demonstrate no acute abnormality. CTA HEAD Anterior circulation: The intracranial internal carotid arteries are patent. Atherosclerotic calcification within the cavernous/paraclinoid segments with sites of mild stenosis. The right middle cerebral artery is patent without significant proximal stenosis. The right anterior cerebral artery is patent. There is a nonocclusive calcified embolus within the frontopolar branch of the right anterior cerebral artery. The left anterior cerebral artery is patent proximally. A nonocclusive calcified embolus is present within the supracallosal segment of the left anterior cerebral artery (series 12, image 20) (series 13, image 77). Apparent high-grade focal stenosis within a left ACA branch vessel immediately distal to this. The left middle cerebral artery is patent. Calcified embolus within a mid left M2 branch with at least some distal reconstitution of this vessel (series 12, image 27). No intracranial aneurysm is identified. Posterior circulation: The  non dominant intracranial right vertebral artery is significantly diminutive. Apparent multifocal moderate/severe stenosis within this vessel. The dominant intracranial left vertebral artery is patent without significant stenosis, as is the basilar artery. The posterior cerebral arteries are patent without significant proximal stenosis. Venous sinuses: Poorly assessed due to contrast timing. Anatomic  variants: There are large bilateral posterior communicating arteries. These results were called by telephone at the time of interpretation on 10/28/2019 at 2:47 pm to provider Dr. Maren Beach, who verbally acknowledged these results. IMPRESSION: CT head: 1. Several punctate acute infarcts within the high left parietal lobe were better appreciated on brain MRI 10/27/2019. No new acute infarct identified. 2. Redemonstrated small chronic infarcts within the left frontal lobe. CTA head: 1. Multiple calcified emboli as follows. Nonocclusive calcified embolus within the frontopolar branch of the right anterior cerebral artery. Nonocclusive calcified embolus within the supracallosal left anterior cerebral artery with an apparent high-grade focal stenosis within a branch vessel immediately distal to this. Calcified embolus within a mid left M2 MCA branch with at least some distal reconstitution of this vessel. 2. Non dominant and diminutive intracranial right vertebral artery with multifocal moderate/severe stenoses. 3. Mild atherosclerotic disease within the intracranial internal carotid arteries with sites of mild stenosis. Electronically Signed   By: Kellie Simmering DO   On: 10/28/2019 14:49   Mr Brain Wo Contrast  Result Date: 10/27/2019 CLINICAL DATA:  77 year old male with near syncope. Weakness dizziness and diaphoresis. EXAM: MRI HEAD WITHOUT CONTRAST TECHNIQUE: Multiplanar, multiecho pulse sequences of the brain and surrounding structures were obtained without intravenous contrast. COMPARISON:  None. FINDINGS: Brain: There are several punctate foci of restricted diffusion in the anterior superior left parietal lobe near the perirolandic cortex (series 5, images 95 and 96). Mild if any associated T2 and FLAIR hyperintensity. No hemorrhage or mass effect. Nearby small area of chronic cortical encephalomalacia in the left motor strip on series 9, image 21. similar area in the more anterior left middle frontal gyrus on  image 20. Possible chronic microhemorrhage in the left frontal lobe subcortical white matter on series 12, image 36, but otherwise there is normal for age gray and white matter signal aside from the above findings. No other restricted diffusion. No midline shift, mass effect, evidence of mass lesion, ventriculomegaly, extra-axial collection or acute intracranial hemorrhage. Cervicomedullary junction within normal limits. Partially empty sella. Vascular: Major intracranial vascular flow voids are preserved. The left vertebral artery appears dominant and the right diminutive. Skull and upper cervical spine: Negative for age visible cervical spine. Normal bone marrow signal. Sinuses/Orbits: Negative orbits. Paranasal sinuses are well pneumatized. Other: Mastoids are clear. Visible internal auditory structures appear normal. Negative scalp and face soft tissues. IMPRESSION: 1. Positive for several punctate acute infarcts in the posterior left MCA territory, superimposed on several small chronic cortical infarcts in that region. No associated hemorrhage or mass effect. 2. Elsewhere largely unremarkable for age non-contrast MRI appearance of the brain. Electronically Signed   By: Genevie Ann M.D.   On: 10/27/2019 17:54   Vas US Carotid (at [facility] Only)  Result Date: 10/28/2019 Carotid Arterial Duplex Study Indications:       CVA, Speech disturbance and Weakness. Risk Factors:      Hypertension, hyperlipidemia. Other Factors:     Sleep apnea. Comparison Study:  No prior study on file Performing Technologist: Sharion Dove RVS  Examination Guidelines: A complete evaluation includes B-mode imaging, spectral Doppler, color Doppler, and power Doppler as needed of all accessible portions of  each vessel. Bilateral testing is considered an integral part of a complete examination. Limited examinations for reoccurring indications may be performed as noted.  Right Carotid Findings:  +----------+--------+--------+--------+------------------+---------+           PSV cm/sEDV cm/sStenosisPlaque DescriptionComments  +----------+--------+--------+--------+------------------+---------+ CCA Prox  28      6                                           +----------+--------+--------+--------+------------------+---------+ CCA Distal29      6               calcific                    +----------+--------+--------+--------+------------------+---------+ ICA Prox  62      25              calcific          Shadowing +----------+--------+--------+--------+------------------+---------+ ICA Distal78      28                                          +----------+--------+--------+--------+------------------+---------+ ECA       70      8                                           +----------+--------+--------+--------+------------------+---------+ +----------+--------+-------+--------+-------------------+           PSV cm/sEDV cmsDescribeArm Pressure (mmHG) +----------+--------+-------+--------+-------------------+ [id number]                                         +----------+--------+-------+--------+-------------------+ +---------+--------+--+--------+--+ VertebralPSV cm/s75EDV cm/s27 +---------+--------+--+--------+--+  Left Carotid Findings: +----------+--------+--------+--------+------------------+------------------+           PSV cm/sEDV cm/sStenosisPlaque DescriptionComments           +----------+--------+--------+--------+------------------+------------------+ CCA Prox  65      12                                intimal thickening +----------+--------+--------+--------+------------------+------------------+ CCA Distal59      10                                intimal thickening +----------+--------+--------+--------+------------------+------------------+ ICA Prox  48      17              calcific          Shadowing           +----------+--------+--------+--------+------------------+------------------+ ICA Distal54      19                                                   +----------+--------+--------+--------+------------------+------------------+ ECA       79      9                                                    +----------+--------+--------+--------+------------------+------------------+ +----------+--------+--------+--------+-------------------+  PSV cm/sEDV cm/sDescribeArm Pressure (mmHG) +----------+--------+--------+--------+-------------------+ [id number]                                          +----------+--------+--------+--------+-------------------+ +---------+--------+--+--------+--+ VertebralPSV cm/s43EDV cm/s10 +---------+--------+--+--------+--+  Summary: Right Carotid: Velocities in the right ICA are consistent with a 1-39% stenosis. Left Carotid: Velocities in the left ICA are consistent with a 1-39% stenosis. Vertebrals:  Bilateral vertebral arteries demonstrate antegrade flow. Subclavians: Normal flow hemodynamics were seen in bilateral subclavian              arteries. *See table(s) above for measurements and observations.     Preliminary     PHYSICAL EXAM  Pleasant elderly Caucasian male not in distress.  . Afebrile. Head is nontraumatic. Neck is supple without bruit.    Cardiac exam no murmur or gallop. Lungs are clear to auscultation. Distal pulses are well felt. Neurological Exam ;  Awake  Alert oriented x 3. Normal speech and language.eye movements full without nystagmus.fundi were not visualized. Vision acuity and fields appear normal. Hearing is normal. Palatal movements are normal. Face symmetric. Tongue midline. Normal strength, tone, reflexes and coordination. Normal sensation. Gait deferred.  ASSESSMENT/PLAN Tyler Sparks is a 78 y.o. male with history of -fib on Xarelto (taking properly and compliant), HTN, HLD, a 30 year history of  bilateral peripheral neuropathy, skin cancer, gout and sleep apnea.presenting with embolic strokes   Stroke: embolic left hemispheric MCA branch infarcts secondary to afib with Xarelto Failure  MRI  - punctate acute infarcts in the posterior left MCA territory, superimposed on several small chronic cortical infarcts in that region  MRA not ordered, pending CTA  Carotid Doppler bilateral 1-39% carotid stenosis  2D Echo normal ejection fraction LDL 73  HgbA1c 5.7    There are no active orders of the following types: Diet, Nourishments.    Xarelt prior to admission, now on Xarelto. Pending completed workup consider switching to Eliquis due to Xarelto failure  Therapy recommendations:  pending  Disposition:  pending  Hypertension . stable . Long-term BP goal normotensive  Hyperlipidemia  Home meds:  resumed in hospital  LDL 73, goal < 70  Continue statin at discharge  Diabetes type II Controlled  HgbA1c 5.7, goal < 7.0  CBGs  SSI  Other Stroke Risk Factors  Advanced age  Obesity, Body mass index is 37.21 kg/m., recommend weight loss, diet and exercise as appropriate   OSA   Hospital day # 2   I have personally obtained history,examined this patient, reviewed notes, independently viewed imaging studies, participated in medical decision making and plan of care.ROS completed by me personally and pertinent positives fully documented  I have made any additions or clarifications directly to the above note.  I had a long discussion with patient with regards to alternative treatment options to Xarelto and is agreeable to switching to Eliquis even though there is no definitive data about it superiority.  Recommend aggressive risk factor modification.  Follow-up as an outpatient stroke clinic in 6 weeks.  Discussed with Dr. Maren Beach.A total of 25 minutes was spent for the care of this patient, spent on counseling patient and family on different diagnostic and therapeutic  options, counseling and coordination of care, riskd ans benefits of management, compliance, or risk factor reduction and education Tyler Contras, MD Medical Director [facility] Pager: [phone] 10/29/2019 4:31 PM    .  To contact Stroke Continuity provider, please refer to [email or web address] After hours, contact General Neurology

## 2019-10-29 NOTE — Plan of Care (Signed)
  Problem: Education: Goal: Knowledge of disease or condition will improve Outcome: Completed/Met Goal: Knowledge of secondary prevention will improve Outcome: Completed/Met Goal: Knowledge of patient specific risk factors addressed and post discharge goals established will improve Outcome: Completed/Met Goal: Individualized Educational Video(s) Outcome: Completed/Met

## 2019-10-29 NOTE — Discharge Summary (Signed)
Physician Discharge Summary  Tyler Sparks [id number] DOB: [birth year] DOA: 10/27/2019  PCP: Velna Hatchet, MD  Admit date: 10/27/2019 Discharge date: 10/29/2019  Admitted From: home Disposition:  home  Recommendations for Outpatient Follow-up:  1. Follow up with PCP in 1-2 weeks, and with neurology and cardiology as outpatient as instructed 2. Please obtain BMP/CBC in one week 3. Please follow up on the following pending results:  Home Health:no  Equipment/Devices:none  Discharge Condition: Stable CODE STATUS: FULL Diet recommendation: Heart Healthy  Brief/Interim Summary: 78 year old male with history of A. fib, chronic anticoagulation with Xarelto, hypertension, hyperlipidemia, peripheral neuropathy, OSA presented after a fall falling feeling weak in bilateral lower extremities 11/7-and patient was confused at that time unable to give cardiac dates and his wife's phone number. He was brought to the ER underwent MRI of the brain that showed acute CVA neuro was consulted  Discharge Diagnoses:  Principal Problem:   Acute cerebrovascular accident (CVA) ([place]) Active Problems:   Atrial fibrillation ([place])   Essential hypertension   Hyperlipidemia   OSA (obstructive sleep apnea)  Acute cerebrovascular accident with fall and confusion-MRI showing several punctate acute infarcts in the posterior left MCA territory, superimposed several small chronic cortical infarcts in that region.  Mental status currently stable and he is non focal. No PT OT needs. carotid ultrasound 1-39% b/l ic stenosis, vert antergrade flor, echocardiogram stable.  Hemoglobin A1c 5.7 at goal.  Lipid panel LDL 73, goal <70.   Patient was on Xarelto despite that he sustained acute stroke neuro advised switching to Eliquis, cont lipitor 10 mg. CTA head showed "  multiple calcified emboli as follows. Nonocclusive calcified embolus within the frontopolar branch of the right anterior cerebral artery. Nonocclusive  calcified embolus within the supracallosal left anterior cerebral artery with an apparent high-grade focal stenosis within a branch vessel immediately distal to this. Calcified embolus within a mid left M2 MCA branch with at least some distal reconstitution of this vessel"  Atrial fibrillation on xarelto-switching to Eliquis as per neurology.  Followed by [facility] cardiology.  Continue atenolol and Cardizem. Essential hypertension: Blood pressure fairly controlled-well medication while here to allow permissive hypertension resume home meds Hyperlipidemia- continue statin CKD stage IIIa baseline creatinine 1.2 to 1.3, stable. follow-up BMP in 5 days w pcp OSA-continue CPAP at night. Hypokalemia improved.  Discussed w neuro and okay to d/c home today Discussed with patient and he verbalized understanding  Procedures: TTE  1. Left ventricular ejection fraction, by visual estimation, is 55%. The left ventricle has normal function. There is mildly increased left ventricular hypertrophy.  2. Left ventricular diastolic parameters are indeterminate in the setting of atrial fibrillation/flutter.  3. Global right ventricle has mildly reduced systolic function.The right ventricular size is normal. No increase in right ventricular wall thickness.  4. Left atrial size was moderately dilated.  5. Right atrial size was normal.  6. Mild aortic valve annular calcification.  7. Mild mitral annular calcification.  8. The mitral valve is grossly normal. Mild mitral valve regurgitation.  9. The tricuspid valve is grossly normal. Tricuspid valve regurgitation is mild. 10. The aortic valve is tricuspid. Aortic valve regurgitation is not visualized. Mild aortic valve sclerosis without stenosis. 11. The pulmonic valve was grossly normal. Pulmonic valve regurgitation is trivial. 12. Mildly elevated pulmonary artery systolic pressure. 13. The inferior vena cava is dilated in size with >50% respiratory  variability, suggesting right atrial pressure of 8 mmHg. 14. The tricuspid regurgitant velocity is 2.76 m/s, and with an  assumed right atrial pressure of 8 mmHg, the estimated right ventricular systolic pressure is mildly elevated at 38.5 mmHg.  cta head 1. Multiple calcified emboli as follows. Nonocclusive calcified embolus within the frontopolar branch of the right anterior cerebral artery. Nonocclusive calcified embolus within the supracallosal left anterior cerebral artery with an apparent high-grade focal stenosis within a branch vessel immediately distal to this. Calcified embolus within a mid left M2 MCA branch with at least some distal reconstitution of this vessel. 2. Non dominant and diminutive intracranial right vertebral artery with multifocal moderate/severe stenoses. 3. Mild atherosclerotic disease within the intracranial internal carotid arteries with sites of mild stenosis  Discharge Instructions  Discharge Instructions    Ambulatory referral to Neurology   Complete by: As directed    An appointment is requested in approximately: 3-4 Week   Diet - low sodium heart healthy   Complete by: As directed    Discharge instructions   Complete by: As directed    Please call call MD or return to ER for similar or worsening recurring problem that brought you to hospital or if any fever,nausea/vomiting,abdominal pain, uncontrolled pain, chest pain,  shortness of breath or any other alarming symptoms.  Please follow-up your doctor as instructed in a week time and call the office for appointment.  Please avoid alcohol, smoking, or any other illicit substance and maintain healthy habits including taking your regular medications as prescribed.  You were cared for by a hospitalist during your hospital stay. If you have any questions about your discharge medications or the care you received while you were in the hospital after you are discharged, you can call the unit and ask to speak  with the hospitalist on call if the hospitalist that took care of you is not available.  Once you are discharged, your primary care physician will handle any further medical issues. Please note that NO REFILLS for any discharge medications will be authorized once you are discharged, as it is imperative that you return to your primary care physician (or establish a relationship with a primary care physician if you do not have one) for your aftercare needs so that they can reassess your need for medications and monitor your lab values  Check BMP in 5 days from PCP or Cardiology office   Increase activity slowly   Complete by: As directed      Allergies as of 10/29/2019      Reactions   Peanuts [peanut Oil] Anaphylaxis   Eggs Or Egg-derived Products Hives, Itching, Swelling      Medication List    STOP taking these medications   Xarelto 20 MG Tabs tablet Generic drug: rivaroxaban     TAKE these medications   allopurinol 300 MG tablet Commonly known as: ZYLOPRIM Take 300 mg by mouth at bedtime.   apixaban 5 MG Tabs tablet Commonly known as: ELIQUIS Take 1 tablet (5 mg total) by mouth 2 (two) times daily.   atenolol 25 MG tablet Commonly known as: TENORMIN Take 25 mg by mouth daily.   atorvastatin 10 MG tablet Commonly known as: LIPITOR Take 10 mg by mouth at bedtime.   co-enzyme Q-10 50 MG capsule Take 50 mg by mouth daily.   colchicine 0.6 MG tablet Take 0.6 mg by mouth daily as needed (FOR GOUT FLARES).   diltiazem 180 MG 24 hr capsule Commonly known as: CARDIZEM CD Take 180 mg by mouth daily.   Fish Oil 1000 MG Caps Take 1,000 mg by mouth daily.  Melatonin 3 MG Caps Take 3 mg by mouth at bedtime.   multivitamin tablet Take 1 tablet by mouth daily.   OSTEO BI-FLEX JOINT SHIELD PO Take 1 tablet by mouth 2 (two) times daily.   valsartan-hydrochlorothiazide 320-12.5 MG tablet Commonly known as: DIOVAN-HCT Take 1 tablet by mouth daily.   vitamin C 500 MG  tablet Commonly known as: ASCORBIC ACID Take 500 mg by mouth daily.   vitamin E 1000 UNIT capsule Take 1,000 Units by mouth daily.      Follow-up Information    Velna Hatchet, MD. Call in 1 day.   Specialty: Internal Medicine Why: As needed Contact information: [address] [place] [address] [phone]          Allergies  Allergen Reactions  . Peanuts [Peanut Oil] Anaphylaxis  . Eggs Or Egg-Derived Products Hives, Itching and Swelling    Consultations:  neuro  Procedures/Studies: Ct Angio Head W Or Wo Contrast  Result Date: 10/28/2019 CLINICAL DATA:  Cardiac thrombus or embolic source suspected. EXAM: CT ANGIOGRAPHY HEAD TECHNIQUE: Multidetector CT imaging of the head was performed using the standard protocol during bolus administration of intravenous contrast. Multiplanar CT image reconstructions and MIPs were obtained to evaluate the vascular anatomy. CONTRAST:  88mL OMNIPAQUE IOHEXOL 350 MG/ML SOLN COMPARISON:  Brain MRI 10/27/2019, FINDINGS: CT HEAD Brain: Again demonstrated are small chronic infarcts within the left frontal lobe. Tiny acute infarcts within the high left parietal lobe were better appreciated on prior MRI 10/27/2019. No new acute infarct is identified. No evidence of intracranial hemorrhage. No midline shift or extra-axial fluid collection. Partially empty sella turcica. Vascular: Reported separately. Skull: Normal. Negative for fracture or focal lesion. Sinuses: Mild ethmoid sinus mucosal thickening. No significant mastoid effusion. Orbits: Visualized orbits demonstrate no acute abnormality. CTA HEAD Anterior circulation: The intracranial internal carotid arteries are patent. Atherosclerotic calcification within the cavernous/paraclinoid segments with sites of mild stenosis. The right middle cerebral artery is patent without significant proximal stenosis. The right anterior cerebral artery is patent. There is a nonocclusive calcified embolus within  the frontopolar branch of the right anterior cerebral artery. The left anterior cerebral artery is patent proximally. A nonocclusive calcified embolus is present within the supracallosal segment of the left anterior cerebral artery (series 12, image 20) (series 13, image 77). Apparent high-grade focal stenosis within a left ACA branch vessel immediately distal to this. The left middle cerebral artery is patent. Calcified embolus within a mid left M2 branch with at least some distal reconstitution of this vessel (series 12, image 27). No intracranial aneurysm is identified. Posterior circulation: The non dominant intracranial right vertebral artery is significantly diminutive. Apparent multifocal moderate/severe stenosis within this vessel. The dominant intracranial left vertebral artery is patent without significant stenosis, as is the basilar artery. The posterior cerebral arteries are patent without significant proximal stenosis. Venous sinuses: Poorly assessed due to contrast timing. Anatomic variants: There are large bilateral posterior communicating arteries. These results were called by telephone at the time of interpretation on 10/28/2019 at 2:47 pm to provider Dr. Maren Beach, who verbally acknowledged these results. IMPRESSION: CT head: 1. Several punctate acute infarcts within the high left parietal lobe were better appreciated on brain MRI 10/27/2019. No new acute infarct identified. 2. Redemonstrated small chronic infarcts within the left frontal lobe. CTA head: 1. Multiple calcified emboli as follows. Nonocclusive calcified embolus within the frontopolar branch of the right anterior cerebral artery. Nonocclusive calcified embolus within the supracallosal left anterior cerebral artery with an apparent high-grade  focal stenosis within a branch vessel immediately distal to this. Calcified embolus within a mid left M2 MCA branch with at least some distal reconstitution of this vessel. 2. Non dominant and  diminutive intracranial right vertebral artery with multifocal moderate/severe stenoses. 3. Mild atherosclerotic disease within the intracranial internal carotid arteries with sites of mild stenosis. Electronically Signed   By: Kellie Simmering DO   On: 10/28/2019 14:49   Mr Brain Wo Contrast  Result Date: 10/27/2019 CLINICAL DATA:  78 year old male with near syncope. Weakness dizziness and diaphoresis. EXAM: MRI HEAD WITHOUT CONTRAST TECHNIQUE: Multiplanar, multiecho pulse sequences of the brain and surrounding structures were obtained without intravenous contrast. COMPARISON:  None. FINDINGS: Brain: There are several punctate foci of restricted diffusion in the anterior superior left parietal lobe near the perirolandic cortex (series 5, images 95 and 96). Mild if any associated T2 and FLAIR hyperintensity. No hemorrhage or mass effect. Nearby small area of chronic cortical encephalomalacia in the left motor strip on series 9, image 21. similar area in the more anterior left middle frontal gyrus on image 20. Possible chronic microhemorrhage in the left frontal lobe subcortical white matter on series 12, image 36, but otherwise there is normal for age gray and white matter signal aside from the above findings. No other restricted diffusion. No midline shift, mass effect, evidence of mass lesion, ventriculomegaly, extra-axial collection or acute intracranial hemorrhage. Cervicomedullary junction within normal limits. Partially empty sella. Vascular: Major intracranial vascular flow voids are preserved. The left vertebral artery appears dominant and the right diminutive. Skull and upper cervical spine: Negative for age visible cervical spine. Normal bone marrow signal. Sinuses/Orbits: Negative orbits. Paranasal sinuses are well pneumatized. Other: Mastoids are clear. Visible internal auditory structures appear normal. Negative scalp and face soft tissues. IMPRESSION: 1. Positive for several punctate acute infarcts in  the posterior left MCA territory, superimposed on several small chronic cortical infarcts in that region. No associated hemorrhage or mass effect. 2. Elsewhere largely unremarkable for age non-contrast MRI appearance of the brain. Electronically Signed   By: Genevie Ann M.D.   On: 10/27/2019 17:54   Vas US Carotid (at [place] Only)  Result Date: 10/28/2019 Carotid Arterial Duplex Study Indications:       CVA, Speech disturbance and Weakness. Risk Factors:      Hypertension, hyperlipidemia. Other Factors:     Sleep apnea. Comparison Study:  No prior study on file Performing Technologist: Sharion Dove RVS  Examination Guidelines: A complete evaluation includes B-mode imaging, spectral Doppler, color Doppler, and power Doppler as needed of all accessible portions of each vessel. Bilateral testing is considered an integral part of a complete examination. Limited examinations for reoccurring indications may be performed as noted.  Right Carotid Findings: +----------+--------+--------+--------+------------------+---------+           PSV cm/sEDV cm/sStenosisPlaque DescriptionComments  +----------+--------+--------+--------+------------------+---------+ CCA Prox  28      6                                           +----------+--------+--------+--------+------------------+---------+ CCA Distal29      6               calcific                    +----------+--------+--------+--------+------------------+---------+ ICA Prox  62      25  calcific          Shadowing +----------+--------+--------+--------+------------------+---------+ ICA Distal78      28                                          +----------+--------+--------+--------+------------------+---------+ ECA       70      8                                           +----------+--------+--------+--------+------------------+---------+ +----------+--------+-------+--------+-------------------+           PSV cm/sEDV  cmsDescribeArm Pressure (mmHG) +----------+--------+-------+--------+-------------------+ [id number]                                         +----------+--------+-------+--------+-------------------+ +---------+--------+--+--------+--+ VertebralPSV cm/s75EDV cm/s27 +---------+--------+--+--------+--+  Left Carotid Findings: +----------+--------+--------+--------+------------------+------------------+           PSV cm/sEDV cm/sStenosisPlaque DescriptionComments           +----------+--------+--------+--------+------------------+------------------+ CCA Prox  65      12                                intimal thickening +----------+--------+--------+--------+------------------+------------------+ CCA Distal59      10                                intimal thickening +----------+--------+--------+--------+------------------+------------------+ ICA Prox  48      17              calcific          Shadowing          +----------+--------+--------+--------+------------------+------------------+ ICA Distal54      19                                                   +----------+--------+--------+--------+------------------+------------------+ ECA       79      9                                                    +----------+--------+--------+--------+------------------+------------------+ +----------+--------+--------+--------+-------------------+           PSV cm/sEDV cm/sDescribeArm Pressure (mmHG) +----------+--------+--------+--------+-------------------+ [id number]                                          +----------+--------+--------+--------+-------------------+ +---------+--------+--+--------+--+ VertebralPSV cm/s43EDV cm/s10 +---------+--------+--+--------+--+  Summary: Right Carotid: Velocities in the right ICA are consistent with a 1-39% stenosis. Left Carotid: Velocities in the left ICA are consistent with a 1-39% stenosis. Vertebrals:   Bilateral vertebral arteries demonstrate antegrade flow. Subclavians: Normal flow hemodynamics were seen in bilateral subclavian              arteries. *See table(s) above for measurements and  observations.     Preliminary     Subjective: Alert awake oriented x3 no new complaints.  Eager to go home today.  Discharge Exam: Vitals:   10/29/19 0800 10/29/19 0940  BP: (!) 149/75 (!) 153/92  Pulse: 65   Resp:    Temp: 97.6 F (36.4 C)   SpO2: 100%    Vitals:   10/28/19 2101 10/29/19 0552 10/29/19 0800 10/29/19 0940  BP: 131/63 118/78 (!) 149/75 (!) 153/92  Pulse: 60 (!) 50 65   Resp: 18 18    Temp: 97.9 F (36.6 C) 97.8 F (36.6 C) 97.6 F (36.4 C)   TempSrc: Oral  Oral   SpO2: 97% 100% 100%   Weight:      Height:        General: Pt is alert, awake, not in acute distress Cardiovascular: RRR, S1/S2 +, no rubs, no gallops Respiratory: CTA bilaterally, no wheezing, no rhonchi Abdominal: Soft, NT, ND, bowel sounds + Extremities: no edema, no cyanosis   The results of significant diagnostics from this hospitalization (including imaging, microbiology, ancillary and laboratory) are listed below for reference.     Microbiology: Recent Results (from the past 240 hour(s))  SARS CORONAVIRUS 2 (TAT 6-24 HRS) Nasopharyngeal Nasopharyngeal Swab     Status: None   Collection Time: 10/27/19  6:54 PM   Specimen: Nasopharyngeal Swab  Result Value Ref Range Status   SARS Coronavirus 2 NEGATIVE NEGATIVE Final    Comment: (NOTE) SARS-CoV-2 target nucleic acids are NOT DETECTED. The SARS-CoV-2 RNA is generally detectable in upper and lower respiratory specimens during the acute phase of infection. Negative results do not preclude SARS-CoV-2 infection, do not rule out co-infections with other pathogens, and should not be used as the sole basis for treatment or other patient management decisions. Negative results must be combined with clinical observations, patient history, and  epidemiological information. The expected result is Negative. Fact Sheet for Patients: SugarRoll.be Fact Sheet for Healthcare Providers: https://www.woods-mathews.com/ This test is not yet approved or cleared by the [place] FDA and  has been authorized for detection and/or diagnosis of SARS-CoV-2 by FDA under an Emergency Use Authorization (EUA). This EUA will remain  in effect (meaning this test can be used) for the duration of the COVID-19 declaration under Section 56 4(b)(1) of the Act, 21 U.S.C. section 360bbb-3(b)(1), unless the authorization is terminated or revoked sooner. Performed at [facility], [place] [address] [place], [place] [address]      Labs: BNP (last 3 results) Recent Labs    10/27/19 1300  BNP [id number]   Basic Metabolic Panel: Recent Labs  Lab 10/27/19 1300 10/28/19 0338 10/29/19 0324  NA 142 140 140  K 3.4* 3.1* 3.5  CL 105 105 106  CO2 26 29 26   GLUCOSE 149* 121* 102*  BUN 27* 18 17  CREATININE 1.36* 1.19 1.36*  CALCIUM 9.4 8.7* 8.7*   Liver Function Tests: Recent Labs  Lab 10/28/19 0338  AST 25  ALT 21  ALKPHOS 58  BILITOT 0.9  PROT 5.6*  ALBUMIN 3.3*   No results for input(s): LIPASE, AMYLASE in the last 168 hours. No results for input(s): AMMONIA in the last 168 hours. CBC: Recent Labs  Lab 10/27/19 1300 10/28/19 0338  WBC 3.4* 5.2  HGB 14.8 13.8  HCT 43.6 40.5  MCV 95.6 95.5  PLT 172 181   Cardiac Enzymes: Recent Labs  Lab 10/27/19 1422  CKTOTAL 85   BNP: Invalid input(s): POCBNP CBG: No results  for input(s): GLUCAP in the last 168 hours. D-Dimer No results for input(s): DDIMER in the last 72 hours. Hgb A1c Recent Labs    10/28/19 0338  HGBA1C 5.7*   Lipid Profile Recent Labs    10/28/19 0338  CHOL 126  HDL 38*  LDLCALC 73  TRIG 74  CHOLHDL 3.3   Thyroid function studies No results for input(s): TSH, T4TOTAL, T3FREE, THYROIDAB in the last 72  hours.  Invalid input(s): FREET3 Anemia work up No results for input(s): VITAMINB12, FOLATE, FERRITIN, TIBC, IRON, RETICCTPCT in the last 72 hours. Urinalysis    Component Value Date/Time   COLORURINE YELLOW 10/27/2019 1516   APPEARANCEUR CLEAR 10/27/2019 1516   LABSPEC 1.016 10/27/2019 1516   PHURINE 7.0 10/27/2019 1516   GLUCOSEU NEGATIVE 10/27/2019 1516   HGBUR NEGATIVE 10/27/2019 1516   BILIRUBINUR NEGATIVE 10/27/2019 1516   KETONESUR 5 (A) 10/27/2019 1516   PROTEINUR NEGATIVE 10/27/2019 1516   NITRITE NEGATIVE 10/27/2019 1516   LEUKOCYTESUR NEGATIVE 10/27/2019 1516   Sepsis Labs Invalid input(s): PROCALCITONIN,  WBC,  LACTICIDVEN Microbiology Recent Results (from the past 240 hour(s))  SARS CORONAVIRUS 2 (TAT 6-24 HRS) Nasopharyngeal Nasopharyngeal Swab     Status: None   Collection Time: 10/27/19  6:54 PM   Specimen: Nasopharyngeal Swab  Result Value Ref Range Status   SARS Coronavirus 2 NEGATIVE NEGATIVE Final    Comment: (NOTE) SARS-CoV-2 target nucleic acids are NOT DETECTED. The SARS-CoV-2 RNA is generally detectable in upper and lower respiratory specimens during the acute phase of infection. Negative results do not preclude SARS-CoV-2 infection, do not rule out co-infections with other pathogens, and should not be used as the sole basis for treatment or other patient management decisions. Negative results must be combined with clinical observations, patient history, and epidemiological information. The expected result is Negative. Fact Sheet for Patients: SugarRoll.be Fact Sheet for Healthcare Providers: https://www.woods-mathews.com/ This test is not yet approved or cleared by the [place] FDA and  has been authorized for detection and/or diagnosis of SARS-CoV-2 by FDA under an Emergency Use Authorization (EUA). This EUA will remain  in effect (meaning this test can be used) for the duration of the COVID-19  declaration under Section 56 4(b)(1) of the Act, 21 U.S.C. section 360bbb-3(b)(1), unless the authorization is terminated or revoked sooner. Performed at [facility], [address], [place], [place] [address]      Time coordinating discharge: 25 minutes  SIGNED:   Antonieta Pert, MD  [facility] 10/29/2019, 11:52 AM  If 7PM-7AM, please contact night-coverage [email or web address]

## 2019-11-05 ENCOUNTER — Other Ambulatory Visit: Payer: Self-pay | Admitting: *Deleted

## 2019-11-05 NOTE — Patient Outreach (Signed)
[facility]) Care Management  11/05/2019  Tyler Sparks Mar 22, 1941 [id number]   RED ON EMMI ALERT Day # 3 Date: 11/02/2019 Red Alert Reason: Feeling worse overall   Outreach attempt #1, successful.  Member has history of A-fib, hypertension, hyperlipidemia, and recent stroke.  Call placed to follow up on above red alert, identity verified.  This care manager introduced self and stated purpose of call.  [facility] case management services explained.  He report he is "in excellent condition", no residuals of the stroke.  State the answer to feeling worse overall was a mistake.  Only concern is how long he should hold medications as advised during discharge but has appointment tomorrow with NP, will discuss at that time.  He was contacted by neurology to schedule appointment but state he declined at the time because he didn't feel like he needed to follow up with anyone but PCP office given that he was feeling much better.  Advised to discussed with NP tomorrow and proceed with follow up with neurology.  Verbalizes understanding.  Denies any needs from [facility], lives with wife and provides own transportation.    Plan: RN CM will close case at this time as no further needs identified.  Tyler Sparks, [place], MSN [place] [phone]

## 2019-11-06 DIAGNOSIS — I639 Cerebral infarction, unspecified: Secondary | ICD-10-CM | POA: Insufficient documentation

## 2019-11-07 ENCOUNTER — Ambulatory Visit: Payer: Medicare Other | Admitting: Cardiovascular Disease

## 2019-11-07 ENCOUNTER — Encounter: Payer: Self-pay | Admitting: Cardiovascular Disease

## 2019-11-07 ENCOUNTER — Other Ambulatory Visit: Payer: Self-pay

## 2019-11-07 VITALS — BP 126/82 | HR 68 | Ht 69.0 in | Wt 256.2 lb

## 2019-11-07 DIAGNOSIS — I4819 Other persistent atrial fibrillation: Secondary | ICD-10-CM

## 2019-11-07 DIAGNOSIS — I712 Thoracic aortic aneurysm, without rupture, unspecified: Secondary | ICD-10-CM

## 2019-11-07 DIAGNOSIS — I10 Essential (primary) hypertension: Secondary | ICD-10-CM

## 2019-11-07 NOTE — Progress Notes (Signed)
Chief Complaint  Patient presents with  . Follow-up    Atrial fibrillation   History of Present Illness: 78 yo male with history of paroxysmal atrial fibrillation, HTN, HLD, gout, sleep apnea and thoracic aortic aneurysm here today for cardiac follow up. I met him in July 2015. He had onset of atrial fib with RVR in 2008 and was converted to sinus with IV diltiazem. He had maintained sinus on Cardizem and atenolol but had not been on long term anticoagulation. He was seen in August 2016 and was in atrial fibrillation. Xarelto was started. Echo 10/15/15 with normal LV function, no valve disease. Plan was for rate control and anti-coagulation. He is known to have a mildly dilated aortic root, 4.3 cm by CTA August 2020. [facility] was [facility]19 with an acute CVA. Echo 10/28/19 with LVEF=55%, mild MR. Carotid artery dopplers 10/30/19 with mild bilateral carotid artery disease. Neurology changed his Xarelto to Eliquis.   He is here today for follow up. The patient denies any chest pain, dyspnea, palpitations, lower extremity edema, orthopnea, PND, dizziness, near syncope or syncope. He has fully recovered his strength over the past week.   Primary Care Physician: Velna Hatchet, MD   Past Medical History:  Diagnosis Date  . Abdominal aortic aneurysm (AAA) ([place]) 2017   Per patient he has AAA and has regular scans with Cardiology following  . Atrial fibrillation ([place])    2008  . Cancer ([place])    skin  . Cholecystitis 10/2018  . Gout   . HTN (hypertension)   . Hyperlipidemia   . Nephrolithiasis   . Peripheral neuropathy   . Sleep apnea     Past Surgical History:  Procedure Laterality Date  . ARM SKIN LESION BIOPSY / EXCISION Right 8 15  . CHOLECYSTECTOMY N/A 11/01/2018   Procedure: LAPAROSCOPIC CHOLECYSTECTOMY;  Surgeon: Clovis Riley, MD;  Location: [facility];  Service: General;  Laterality: N/A;  . [place]  . SKIN CANCER EXCISION  2008, 07/2014  .  TONSILECTOMY/ADENOIDECTOMY WITH MYRINGOTOMY  1950  . WISDOM TOOTH EXTRACTION      Current Outpatient Medications  Medication Sig Dispense Refill  . allopurinol (ZYLOPRIM) 300 MG tablet Take 300 mg by mouth at bedtime.     Marland Kitchen apixaban (ELIQUIS) 5 MG TABS tablet Take 1 tablet (5 mg total) by mouth 2 (two) times daily. 60 tablet 1  . atenolol (TENORMIN) 25 MG tablet Take 25 mg by mouth daily.    Marland Kitchen atorvastatin (LIPITOR) 10 MG tablet Take 10 mg by mouth at bedtime.     Marland Kitchen co-enzyme Q-10 50 MG capsule Take 50 mg by mouth daily.    . colchicine 0.6 MG tablet Take 0.6 mg by mouth daily as needed (FOR GOUT FLARES).     Marland Kitchen diltiazem (CARDIZEM CD) 180 MG 24 hr capsule Take 180 mg by mouth daily.    . Melatonin 3 MG CAPS Take 3 mg by mouth at bedtime.     . Misc Natural Products (OSTEO BI-FLEX JOINT SHIELD PO) Take 1 tablet by mouth 2 (two) times daily.     . Multiple Vitamin (MULTIVITAMIN) tablet Take 1 tablet by mouth daily.    . Omega-3 Fatty Acids (FISH OIL) 1000 MG CAPS Take 1,000 mg by mouth daily.     . valsartan-hydrochlorothiazide (DIOVAN-HCT) 320-12.5 MG tablet Take 1 tablet by mouth daily.    . vitamin C (ASCORBIC ACID) 500 MG tablet Take 500 mg by mouth daily.    Marland Kitchen  vitamin E 1000 UNIT capsule Take 1,000 Units by mouth daily.     No current facility-administered medications for this visit.     Allergies  Allergen Reactions  . Peanuts [Peanut Oil] Anaphylaxis  . Eggs Or Egg-Derived Products Hives, Itching and Swelling    Social History   Socioeconomic History  . Marital status: Married    Spouse name: Not on file  . Number of children: 2  . Years of education: Not on file  . Highest education level: Not on file  Occupational History  . Occupation: Retired-Linguistics Professor [employer]  Social Needs  . Financial resource strain: Not on file  . Food insecurity    Worry: Not on file    Inability: Not on file  . Transportation needs    Medical: Not on file    Non-medical: Not on  file  Tobacco Use  . Smoking status: Former Smoker    Packs/day: 1.00    Years: 14.00    Pack years: 14.00    Quit date: 12/26/1973    Years since quitting: 45.8  . Smokeless tobacco: Never Used  Substance and Sexual Activity  . Alcohol use: Yes    Alcohol/week: 14.0 standard drinks    Types: 14 Standard drinks or equivalent per week    Comment: When at [facility] has two glasses of wine per night  . Drug use: No  . Sexual activity: Not Currently  Lifestyle  . Physical activity    Days per week: Not on file    Minutes per session: Not on file  . Stress: Not on file  Relationships  . Social Herbalist on phone: Not on file    Gets together: Not on file    Attends religious service: Not on file    Active member of club or organization: Not on file    Attends meetings of clubs or organizations: Not on file    Relationship status: Not on file  . Intimate partner violence    Fear of current or ex partner: Not on file    Emotionally abused: Not on file    Physically abused: Not on file    Forced sexual activity: Not on file  Other Topics Concern  . Not on file  Social History Narrative  . Not on file    Family History  Problem Relation Age of Onset  . Heart attack Father   . Heart attack Mother   . Colon cancer Neg Hx   . Esophageal cancer Neg Hx   . Rectal cancer Neg Hx   . Stomach cancer Neg Hx     Review of Systems:  As stated in the HPI and otherwise negative.   BP 126/82   Pulse 68   Ht 5' 9"  (1.753 m)   Wt 256 lb 3.2 oz (116.2 kg)   SpO2 97%   BMI 37.83 kg/m   Physical Examination:  General: Well developed, well nourished, NAD  HEENT: OP clear, mucus membranes moist  SKIN: warm, dry. No rashes. Neuro: No focal deficits  Musculoskeletal: Muscle strength 5/5 all ext  Psychiatric: Mood and affect normal  Neck: No JVD, no carotid bruits, no thyromegaly, no lymphadenopathy.  Lungs:Clear bilaterally, no wheezes, rhonci, crackles Cardiovascular:  Regular rate and rhythm. No murmurs, gallops or rubs. Abdomen:Soft. Bowel sounds present. Non-tender.  Extremities: No lower extremity edema. Pulses are 2 + in the bilateral DP/PT.  EKG:  EKG is not  ordered today. The ekg ordered today  demonstrates   Echo 10/28/19:  1. Left ventricular ejection fraction, by visual estimation, is 55%. The left ventricle has normal function. There is mildly increased left ventricular hypertrophy.  2. Left ventricular diastolic parameters are indeterminate in the setting of atrial fibrillation/flutter.  3. Global right ventricle has mildly reduced systolic function.The right ventricular size is normal. No increase in right ventricular wall thickness.  4. Left atrial size was moderately dilated.  5. Right atrial size was normal.  6. Mild aortic valve annular calcification.  7. Mild mitral annular calcification.  8. The mitral valve is grossly normal. Mild mitral valve regurgitation.  9. The tricuspid valve is grossly normal. Tricuspid valve regurgitation is mild. 10. The aortic valve is tricuspid. Aortic valve regurgitation is not visualized. Mild aortic valve sclerosis without stenosis. 11. The pulmonic valve was grossly normal. Pulmonic valve regurgitation is trivial. 12. Mildly elevated pulmonary artery systolic pressure. 13. The inferior vena cava is dilated in size with >50% respiratory variability, suggesting right atrial pressure of 8 mmHg. 14. The tricuspid regurgitant velocity is 2.76 m/s, and with an assumed right atrial pressure of 8 mmHg, the estimated right ventricular systolic pressure is mildly elevated at 38.5 mmHg.  Recent Labs: 10/27/2019: B Natriuretic Peptide 84.1 10/28/2019: ALT 21; Hemoglobin 13.8; Platelets 181 10/29/2019: BUN 17; Creatinine, Ser 1.36; Potassium 3.5; Sodium 140    Wt Readings from Last 3 Encounters:  11/07/19 256 lb 3.2 oz (116.2 kg)  10/27/19 252 lb (114.3 kg)  11/20/18 265 lb 8 oz (120.4 kg)     Other studies  Reviewed: Additional studies/ records that were reviewed today include: . Review of the above records demonstrates:   Assessment and Plan:   1. Persistent atrial fibrillation: Atrial fib today. Rate controlled. Continue Cardizem, atenolol and Eliquis.  2. Thoracic aortic aneurysm (ascending aorta): Mild dilation of ascending aorta by CTA August 2020.   3. Coronary artery calcification: Noted on CTA chest. He has no symptoms c/w angina and is very active. LIkely that this represents calcification of his artery wall only without significant luminal obstruction. No plans for ischemic workup at this time.   4. HTN: BP controlled. No changes  5. Carotid artery disease: Mild bilateral disease by carotid artery dopplers 2020.  6. History of CVA November 2020: Fully recovered. Xarelto changed to Eliquis by Neurology.     Current medicines are reviewed at length with the patient today.  The patient does not have concerns regarding medicines.  The following changes have been made:   Labs/ tests ordered today include  No orders of the defined types were placed in this encounter.   Disposition:   FU with me in 12 months   Signed, Lauree Chandler, MD 11/07/2019 3:06 PM    [facility] [address], [place], [place]  [address] Phone: [phone]; Fax: [phone]

## 2019-11-07 NOTE — Patient Instructions (Signed)
Medication Instructions:  No chanages *If you need a refill on your cardiac medications before your next appointment, please call your pharmacy*  Lab Work: none If you have labs (blood work) drawn today and your tests are completely normal, you will receive your results only by: Marland Kitchen MyChart Message (if you have MyChart) OR . A paper copy in the mail If you have any lab test that is abnormal or we need to change your treatment, we will call you to review the results.  Testing/Procedures: none  Follow-Up: At [facility], you and your health needs are our priority.  As part of our continuing mission to provide you with exceptional heart care, we have created designated Provider Care Teams.  These Care Teams include your primary Cardiologist (physician) and Advanced Practice Providers (APPs -  Physician Assistants and Nurse Practitioners) who all work together to provide you with the care you need, when you need it.  Your next appointment:   6 month(s)  The format for your next appointment:   In Person  Provider:   Lauree Chandler, MD  Other Instructions

## 2019-11-20 ENCOUNTER — Ambulatory Visit: Payer: Medicare Other | Admitting: Neurology

## 2019-11-20 ENCOUNTER — Encounter

## 2019-11-20 ENCOUNTER — Telehealth: Payer: Medicare Other | Admitting: Physician Assistant

## 2019-11-20 ENCOUNTER — Encounter: Payer: Self-pay | Admitting: Neurology

## 2019-11-20 ENCOUNTER — Other Ambulatory Visit: Payer: Self-pay

## 2019-11-20 VITALS — BP 143/76 | HR 65 | Temp 97.0°F | Ht 69.0 in | Wt 255.0 lb

## 2019-11-20 DIAGNOSIS — I482 Chronic atrial fibrillation, unspecified: Secondary | ICD-10-CM | POA: Diagnosis not present

## 2019-11-20 DIAGNOSIS — I6310 Cerebral infarction due to embolism of unspecified precerebral artery: Secondary | ICD-10-CM

## 2019-11-20 NOTE — Patient Instructions (Signed)
I had a long d/w patient about his recent embolic stroke, atrial fibrillation,risk for recurrent stroke/TIAs, personally independently reviewed imaging studies and stroke evaluation results and answered questions.Continue Eliquis (apixaban) daily  for secondary stroke prevention and maintain strict control of hypertension with blood pressure goal below 130/90, diabetes with hemoglobin A1c goal below 6.5% and lipids with LDL cholesterol goal below 70 mg/dL. I also advised the patient to eat a healthy diet with plenty of whole grains, cereals, fruits and vegetables, exercise regularly and maintain ideal body weight Followup in the future with my nurse practitioner Janett Billow in 6 months or call earlier if necessary.   Stroke Prevention Some medical conditions and behaviors are associated with a higher chance of having a stroke. You can help prevent a stroke by making nutrition, lifestyle, and other changes, including managing any medical conditions you may have. What nutrition changes can be made?   Eat healthy foods. You can do this by: ? Choosing foods high in fiber, such as fresh fruits and vegetables and whole grains. ? Eating at least 5 or more servings of fruits and vegetables a day. Try to fill half of your plate at each meal with fruits and vegetables. ? Choosing lean protein foods, such as lean cuts of meat, poultry without skin, fish, tofu, beans, and nuts. ? Eating low-fat dairy products. ? Avoiding foods that are high in salt (sodium). This can help lower blood pressure. ? Avoiding foods that have saturated fat, trans fat, and cholesterol. This can help prevent high cholesterol. ? Avoiding processed and premade foods.  Follow your health care provider's specific guidelines for losing weight, controlling high blood pressure (hypertension), lowering high cholesterol, and managing diabetes. These may include: ? Reducing your daily calorie intake. ? Limiting your daily sodium intake to 1,500  milligrams (mg). ? Using only healthy fats for cooking, such as olive oil, canola oil, or sunflower oil. ? Counting your daily carbohydrate intake. What lifestyle changes can be made?  Maintain a healthy weight. Talk to your health care provider about your ideal weight.  Get at least 30 minutes of moderate physical activity at least 5 days a week. Moderate activity includes brisk walking, biking, and swimming.  Do not use any products that contain nicotine or tobacco, such as cigarettes and e-cigarettes. If you need help quitting, ask your health care provider. It may also be helpful to avoid exposure to secondhand smoke.  Limit alcohol intake to no more than 1 drink a day for nonpregnant women and 2 drinks a day for men. One drink equals 12 oz of beer, 5 oz of wine, or 1 oz of hard liquor.  Stop any illegal drug use.  Avoid taking birth control pills. Talk to your health care provider about the risks of taking birth control pills if: ? You are over 39 years old. ? You smoke. ? You get migraines. ? You have ever had a blood clot. What other changes can be made?  Manage your cholesterol levels. ? Eating a healthy diet is important for preventing high cholesterol. If cholesterol cannot be managed through diet alone, you may also need to take medicines. ? Take any prescribed medicines to control your cholesterol as told by your health care provider.  Manage your diabetes. ? Eating a healthy diet and exercising regularly are important parts of managing your blood sugar. If your blood sugar cannot be managed through diet and exercise, you may need to take medicines. ? Take any prescribed medicines to control your  diabetes as told by your health care provider.  Control your hypertension. ? To reduce your risk of stroke, try to keep your blood pressure below 130/80. ? Eating a healthy diet and exercising regularly are an important part of controlling your blood pressure. If your blood  pressure cannot be managed through diet and exercise, you may need to take medicines. ? Take any prescribed medicines to control hypertension as told by your health care provider. ? Ask your health care provider if you should monitor your blood pressure at home. ? Have your blood pressure checked every year, even if your blood pressure is normal. Blood pressure increases with age and some medical conditions.  Get evaluated for sleep disorders (sleep apnea). Talk to your health care provider about getting a sleep evaluation if you snore a lot or have excessive sleepiness.  Take over-the-counter and prescription medicines only as told by your health care provider. Aspirin or blood thinners (antiplatelets or anticoagulants) may be recommended to reduce your risk of forming blood clots that can lead to stroke.  Make sure that any other medical conditions you have, such as atrial fibrillation or atherosclerosis, are managed. What are the warning signs of a stroke? The warning signs of a stroke can be easily remembered as BEFAST.  B is for balance. Signs include: ? Dizziness. ? Loss of balance or coordination. ? Sudden trouble walking.  E is for eyes. Signs include: ? A sudden change in vision. ? Trouble seeing.  F is for face. Signs include: ? Sudden weakness or numbness of the face. ? The face or eyelid drooping to one side.  A is for arms. Signs include: ? Sudden weakness or numbness of the arm, usually on one side of the body.  S is for speech. Signs include: ? Trouble speaking (aphasia). ? Trouble understanding.  T is for time. ? These symptoms may represent a serious problem that is an emergency. Do not wait to see if the symptoms will go away. Get medical help right away. Call your local emergency services (911 in the [place] Do not drive yourself to the hospital.  Other signs of stroke may include: ? A sudden, severe headache with no known cause. ? Nausea or vomiting. ?  Seizure. Where to find more information For more information, visit:  American Stroke Association: www.strokeassociation.org  National Stroke Association: www.stroke.org Summary  You can prevent a stroke by eating healthy, exercising, not smoking, limiting alcohol intake, and managing any medical conditions you may have.  Do not use any products that contain nicotine or tobacco, such as cigarettes and e-cigarettes. If you need help quitting, ask your health care provider. It may also be helpful to avoid exposure to secondhand smoke.  Remember BEFAST for warning signs of stroke. Get help right away if you or a loved one has any of these signs. This information is not intended to replace advice given to you by your health care provider. Make sure you discuss any questions you have with your health care provider. Document Released: 01/13/2005 Document Revised: 11/18/2017 Document Reviewed: 01/11/2017 Elsevier Patient Education  2020 [employer].

## 2019-11-21 NOTE — Progress Notes (Signed)
[facility] [address] [place]. [place] [address] [phone]       OFFICE FOLLOW-UP NOTE  Tyler Sparks Date of Birth:  November 01, 1941 Medical Record Number:  [id number]   HPI: Tyler Sparks is a pleasant 78 year old Caucasian male seen today for initial office follow-up visit following hospital admission for stroke in November 2020.  History is obtained from the patient, review of electronic medical records and I personally reviewed imaging films in PACS.  He is a 78 year old male with past medical history of chronic atrial fibrillation on long-term Xarelto, hypertension, hyperlipidemia, bilateral peripheral neuropathy, skin cancer, gout and sleep apnea who presented to [facility] on 10/27/2019 after having a fall while walking in his neighborhood.  The fall was preceded by a feeling of legs getting unusually weak and giving out from underneath him.  He fell down to 1 knee but did not any major trauma.  He did require to be assisted to his feet noted that he became dizzy, diaphoretic and pale prior to his fall.  Scan of the brain done in the ED showed 2 small punctate cortically based infarcts in the left parietal lobe.  Patient states he had been quite compliant with his Xarelto but he was not taking it exactly at the same time every day.  LDL cholesterol was 73 mg percent hemoglobin A1c 5.7.  Carotid Doppler showed bilateral 1-39% carotid stenosis.  2D echo showed normal ejection fraction.  CT angiogram of the brain showed multiple small calcified emboli within the frontopolar branch of the right anterior cerebral artery and left supra callosal anterior cerebral artery as well as left mid M2 branch.  Patient states is done well since discharge.  Is tolerating Eliquis well without bruising or bleeding.  His blood pressures well controlled at home today it is borderline at 143/76 in office.  Remains on Lipitor which is tolerating well without muscle aches and pains.  He  continues to have mild paresthesias in his feet from his chronic peripheral neuropathy but this is unchanged.  He recently saw his cardiologist major medication changes were made.  He states he is compliant with his CPAP for sleep apnea.  He has no complaints today.  ROS:   14 system review of systems is positive for speech difficulty leg weakness, gait difficulty and all other systems negative PMH:  Past Medical History:  Diagnosis Date   Abdominal aortic aneurysm (AAA) ([place]) 2017   Per patient he has AAA and has regular scans with Cardiology following   Atrial fibrillation ([place])    2008   Cancer [facility])    skin   Cholecystitis 10/2018   Gout    HTN (hypertension)    Hyperlipidemia    Nephrolithiasis    Peripheral neuropathy    Sleep apnea     Social History:  Social History   Socioeconomic History   Marital status: Married    Spouse name: Not on file   Number of children: 2   Years of education: Not on file   Highest education level: Not on file  Occupational History   Occupation: Retired-Linguistics Professor Publishing rights manager strain: Not on file   Food insecurity    Worry: Not on file    Inability: Not on file   Transportation needs    Medical: Not on file    Non-medical: Not on file  Tobacco Use   Smoking status: Former Smoker    Packs/day: 1.00  Years: 14.00    Pack years: 14.00    Quit date: 12/26/1973    Years since quitting: 45.9   Smokeless tobacco: Never Used  Substance and Sexual Activity   Alcohol use: Yes    Alcohol/week: 14.0 standard drinks    Types: 14 Standard drinks or equivalent per week    Comment: When at [facility] has two glasses of wine per night   Drug use: No   Sexual activity: Not Currently  Lifestyle   Physical activity    Days per week: Not on file    Minutes per session: Not on file   Stress: Not on file  Relationships   Social connections    Talks on phone: Not on file    Gets  together: Not on file    Attends religious service: Not on file    Active member of club or organization: Not on file    Attends meetings of clubs or organizations: Not on file    Relationship status: Not on file   Intimate partner violence    Fear of current or ex partner: Not on file    Emotionally abused: Not on file    Physically abused: Not on file    Forced sexual activity: Not on file  Other Topics Concern   Not on file  Social History Narrative   Not on file    Medications:   Current Outpatient Medications on File Prior to Visit  Medication Sig Dispense Refill   allopurinol (ZYLOPRIM) 300 MG tablet Take 300 mg by mouth at bedtime.      apixaban (ELIQUIS) 5 MG TABS tablet Take 1 tablet (5 mg total) by mouth 2 (two) times daily. 60 tablet 1   atenolol (TENORMIN) 25 MG tablet Take 25 mg by mouth daily.     atorvastatin (LIPITOR) 10 MG tablet Take 10 mg by mouth at bedtime.      co-enzyme Q-10 50 MG capsule Take 50 mg by mouth daily.     colchicine 0.6 MG tablet Take 0.6 mg by mouth daily as needed (FOR GOUT FLARES).      diltiazem (CARDIZEM CD) 180 MG 24 hr capsule Take 180 mg by mouth daily.     Melatonin 3 MG CAPS Take 3 mg by mouth at bedtime.      Misc Natural Products (OSTEO BI-FLEX JOINT SHIELD PO) Take 1 tablet by mouth 2 (two) times daily.      Multiple Vitamin (MULTIVITAMIN) tablet Take 1 tablet by mouth daily.     Omega-3 Fatty Acids (FISH OIL) 1000 MG CAPS Take 1,000 mg by mouth daily.      valsartan-hydrochlorothiazide (DIOVAN-HCT) 320-12.5 MG tablet Take 1 tablet by mouth daily.     vitamin C (ASCORBIC ACID) 500 MG tablet Take 500 mg by mouth daily.     vitamin E 1000 UNIT capsule Take 1,000 Units by mouth daily.     No current facility-administered medications on file prior to visit.     Allergies:   Allergies  Allergen Reactions   Peanuts [Peanut Oil] Anaphylaxis   Eggs Or Egg-Derived Products Hives, Itching and Swelling    Physical  Exam General: well developed, well nourished elderly mildly obese Caucasian male seated, in no evident distress Head: head normocephalic and atraumatic.  Neck: supple with no carotid or supraclavicular bruits Cardiovascular: regular rate and rhythm, no murmurs Musculoskeletal: no deformity Skin:  no rash/petichiae Vascular:  Normal pulses all extremities Vitals:   11/20/19 0832  BP: (!) 143/76  Pulse: 65  Temp: (!) 97 F (36.1 C)   Neurologic Exam Mental Status: Awake and fully alert. Oriented to place and time. Recent and remote memory intact. Attention span, concentration and fund of knowledge appropriate. Mood and affect appropriate.  Cranial Nerves: Fundoscopic exam reveals sharp disc margins. Pupils equal, briskly reactive to light. Extraocular movements full without nystagmus. Visual fields full to confrontation. Hearing intact. Facial sensation intact. Face, tongue, palate moves normally and symmetrically.  Motor: Normal bulk and tone. Normal strength in all tested extremity muscles. Sensory.: intact to touch ,pinprick .position and vibratory sensation.  Coordination: Rapid alternating movements normal in all extremities. Finger-to-nose and heel-to-shin performed accurately bilaterally. Gait and Station: Arises from chair without difficulty. Stance is normal. Gait demonstrates normal stride length and balance . Able to heel, toe and tandem walk with moderate difficulty.  Reflexes: 1+ and symmetric. Toes downgoing.   NIHSS  0 Modified Rankin  0   ASSESSMENT: 78 year old Caucasian male with embolic left MCA branch infarcts in November 2020 secondary to atrial fibrillation with Xarelto failure.  Patient has done well after switching to Eliquis.  Vascular risk factors of atrial fibrillation, hypertension hyperlipidemia.     PLAN: I had a long d/w patient about his recent embolic stroke, atrial fibrillation,risk for recurrent stroke/TIAs, personally independently reviewed imaging  studies and stroke evaluation results and answered questions.Continue Eliquis (apixaban) daily  for secondary stroke prevention and maintain strict control of hypertension with blood pressure goal below 130/90, diabetes with hemoglobin A1c goal below 6.5% and lipids with LDL cholesterol goal below 70 mg/dL.  He was advised to be compliant with his CPAP for his sleep apnea.  I also advised the patient to eat a healthy diet with plenty of whole grains, cereals, fruits and vegetables, exercise regularly and maintain ideal body weight Followup in the future with my nurse practitioner Janett Billow in 6 months or call earlier if necessary.Greater than 50% of time during this 25 minute visit was spent on counseling,explanation of diagnosis, planning of further management, discussion with patient and family and coordination of care Tyler Contras, MD  [facility] [address] [place], [place] [address]  Phone [phone] Fax [phone] Note: This document was prepared with digital dictation and possible smart phrase technology. Any transcriptional errors that result from this process are unintentional

## 2019-12-20 ENCOUNTER — Ambulatory Visit: Payer: Medicare Other | Admitting: Cardiovascular Disease

## 2020-01-03 ENCOUNTER — Ambulatory Visit: Payer: Medicare Other | Attending: Internal Medicine

## 2020-01-03 DIAGNOSIS — Z23 Encounter for immunization: Secondary | ICD-10-CM | POA: Insufficient documentation

## 2020-01-03 NOTE — Progress Notes (Signed)
   Covid-19 Vaccination Clinic  Name:  Tyler Sparks    MRN: [id number] DOB: 08/13/1941  01/03/2020  Mr. Nicosia was observed post Covid-19 immunization for 30 minutes based on pre-vaccination screening without incidence. He was provided with Vaccine Information Sheet and instruction to access the V-Safe system.   Mr. Bomgardner was instructed to call 911 with any severe reactions post vaccine: Marland Kitchen Difficulty breathing  . Swelling of your face and throat  . A fast heartbeat  . A bad rash all over your body  . Dizziness and weakness    Immunizations Administered    Name Date Dose VIS Date Route   Pfizer COVID-19 Vaccine 01/03/2020  9:06 AM 0.3 mL 11/30/2019 Intramuscular   Manufacturer: [employer]   Lot: [id number]   [place]: [id number]

## 2020-01-23 ENCOUNTER — Ambulatory Visit: Payer: Medicare PPO | Attending: Internal Medicine

## 2020-01-23 DIAGNOSIS — Z23 Encounter for immunization: Secondary | ICD-10-CM | POA: Insufficient documentation

## 2020-01-23 NOTE — Progress Notes (Signed)
   Covid-19 Vaccination Clinic  Name:  Tyler Sparks    MRN: [id number] DOB: April 08, 1941  01/23/2020  [name] was observed post Covid-19 immunization for 15 minutes without incidence. He was provided with Vaccine Information Sheet and instruction to access the V-Safe system.   [name] was instructed to call 911 with any severe reactions post vaccine: Marland Kitchen Difficulty breathing  . Swelling of your face and throat  . A fast heartbeat  . A bad rash all over your body  . Dizziness and weakness    Immunizations Administered    Name Date Dose VIS Date Route   Pfizer COVID-19 Vaccine 01/23/2020  9:39 AM 0.3 mL 11/30/2019 Intramuscular   Manufacturer: [place]   Lot: [id number]   [place]: [id number]

## 2020-01-25 ENCOUNTER — Other Ambulatory Visit: Payer: Self-pay | Admitting: Cardiovascular Disease

## 2020-01-25 NOTE — Telephone Encounter (Signed)
Xarelto 20mg  refill request received. Pt is 79 years old, weight-115.7kg, Crea-1.36 on 10/29/2019, last seen by Dr. Angelena Form on 11/07/2019, Diagnosis-Afib, CrCl-74.61ml/min; Dose is appropriate based on dosing criteria. Will send in refill to requested pharmacy.

## 2020-05-06 ENCOUNTER — Telehealth: Payer: Self-pay | Admitting: Cardiovascular Disease

## 2020-05-06 DIAGNOSIS — I712 Thoracic aortic aneurysm, without rupture, unspecified: Secondary | ICD-10-CM

## 2020-05-06 NOTE — Telephone Encounter (Signed)
Returned call to pt.  He has been made aware that on his last CT it doesn't state whether it will be repeated in 1 year or when. Pt is also confused when he is supposed to follow back up with Dr. Angelena Form, as Dr. Camillia Herter note said 1 year and his After Visit Summary said 1 year.  Pt aware I will send message to [place], RN, and she can confirm when pt is supposed to f/u back up and when CT will be due, and she can call pt with this information.  Pt verbalized understanding.

## 2020-05-06 NOTE — Telephone Encounter (Signed)
New Message    Pt is wondering if he needs to get a CT done before seeing Dr Angelena Form     Please advise

## 2020-05-07 NOTE — Telephone Encounter (Signed)
Yes. Repeat in August. Thanks

## 2020-05-07 NOTE — Telephone Encounter (Signed)
Spoke with patient. He is not having any problems at all w his heart Continues Eliquis.  We sent him a letter for 6 month follow up but last ov w Dr. Angelena Form shows follow up for one year. Since he is doing well, he is in agreement to be seen in November 2021 (1 year f/u).  Will call for sooner appointment if needs anything sooner.  Asks about his next ct scan of aorta.  Has been having one yearly in Aug. Aware I will route to Dr. Angelena Form and if plan is to repeat in Aug, we will call him to schedule this.

## 2020-05-22 ENCOUNTER — Other Ambulatory Visit: Payer: Self-pay

## 2020-05-22 ENCOUNTER — Encounter: Payer: Self-pay | Admitting: Adult Health

## 2020-05-22 ENCOUNTER — Ambulatory Visit: Payer: Medicare PPO | Admitting: Adult Health

## 2020-05-22 VITALS — BP 156/86 | HR 69 | Ht 69.0 in | Wt 256.0 lb

## 2020-05-22 DIAGNOSIS — I482 Chronic atrial fibrillation, unspecified: Secondary | ICD-10-CM

## 2020-05-22 DIAGNOSIS — E785 Hyperlipidemia, unspecified: Secondary | ICD-10-CM | POA: Diagnosis not present

## 2020-05-22 DIAGNOSIS — G4730 Sleep apnea, unspecified: Secondary | ICD-10-CM

## 2020-05-22 DIAGNOSIS — Z8673 Personal history of transient ischemic attack (TIA), and cerebral infarction without residual deficits: Secondary | ICD-10-CM

## 2020-05-22 DIAGNOSIS — I10 Essential (primary) hypertension: Secondary | ICD-10-CM

## 2020-05-22 NOTE — Patient Instructions (Signed)
Continue Eliquis (apixaban) daily  and lipitor  for secondary stroke prevention  Continue to follow up with PCP regarding cholesterol and blood pressure management   Continue to follow with cardiology for atrial fibrillation and eliquis management  Continue to monitor blood pressure at home  Maintain strict control of hypertension with blood pressure goal below 130/90, diabetes with hemoglobin A1c goal below 6.5% and cholesterol with LDL cholesterol (bad cholesterol) goal below 70 mg/dL. I also advised the patient to eat a healthy diet with plenty of whole grains, cereals, fruits and vegetables, exercise regularly and maintain ideal body weight.  As you have recovered well from a stroke standpoint, recommend follow up as needed       Thank you for coming to see [place] at [facility]. I hope we have been able to provide you high quality care today.  You may receive a patient satisfaction survey over the next few weeks. We would appreciate your feedback and comments so that we may continue to improve ourselves and the health of our patients.

## 2020-05-22 NOTE — Progress Notes (Signed)
[facility] [address] [place]. [place] [address] [phone]       OFFICE FOLLOW-UP NOTE  [name] Date of Birth:  06-30-1941 Medical Record Number:  [id number]   Chief complaint: Chief Complaint  Patient presents with  . Follow-up    rm 9, stroke fu, pt states he is doing well, reports his osa has improved as well      HPI:   Today, 05/22/2020, [name] returns for follow-up regarding left parietal stroke in 10/2019.  He has been doing well since prior visit without residual deficits or new/reoccurring stroke/TIA symptoms.  Continues on Eliquis and atorvastatin for history of atrial fibrillation and for secondary stroke prevention.  Blood pressure today 156/86. He does monitor at home and typically stable -normally elevated during appointments. Endorses ongoing compliance with CPAP for sleep apnea management. Reports prior to stroke, great difficulty managing his mixed sleep apnea but after his stroke, for unknown reasons, his apnea is now under control.  He continues to follow with Dr. Maxwell Caul for CPAP management at [place].  He does report occasional imbalance but believes this is due to his chronic peripheral neuropathy and decreased exercise during pandemic.  No further concerns at this time.   History provided for reference purposes only Initial visit 11/20/2019 Dr. Leonie Man: [name] is a pleasant 79 year old Caucasian male seen today for initial office follow-up visit following hospital admission for stroke in November 2020.  History is obtained from the patient, review of electronic medical records and I personally reviewed imaging films in PACS.  He is a 79 year old male with past medical history of chronic atrial fibrillation on long-term Xarelto, hypertension, hyperlipidemia, bilateral peripheral neuropathy, skin cancer, gout and sleep apnea who presented to [facility] on 10/27/2019 after having a fall while walking in his  neighborhood.  The fall was preceded by a feeling of legs getting unusually weak and giving out from underneath him.  He fell down to 1 knee but did not any major trauma.  He did require to be assisted to his feet noted that he became dizzy, diaphoretic and pale prior to his fall.  Scan of the brain done in the ED showed 2 small punctate cortically based infarcts in the left parietal lobe.  Patient states he had been quite compliant with his Xarelto but he was not taking it exactly at the same time every day.  LDL cholesterol was 73 mg percent hemoglobin A1c 5.7.  Carotid Doppler showed bilateral 1-39% carotid stenosis.  2D echo showed normal ejection fraction.  CT angiogram of the brain showed multiple small calcified emboli within the frontopolar branch of the right anterior cerebral artery and left supra callosal anterior cerebral artery as well as left mid M2 branch.    Patient states is done well since discharge.  Is tolerating Eliquis well without bruising or bleeding.  His blood pressures well controlled at home today it is borderline at 143/76 in office.  Remains on Lipitor which is tolerating well without muscle aches and pains.  He continues to have mild paresthesias in his feet from his chronic peripheral neuropathy but this is unchanged.  He recently saw his cardiologist major medication changes were made.  He states he is compliant with his CPAP for sleep apnea.  He has no complaints today.  ROS:   14 system review of systems is positive for occasional imbalance, edema and numbness/tingling and all other systems negative   PMH:  Past Medical History:  Diagnosis  Date  . Abdominal aortic aneurysm (AAA) ([place]) 2017   Per patient he has AAA and has regular scans with Cardiology following  . Atrial fibrillation ([place])    2008  . Cancer ([place])    skin  . Cholecystitis 10/2018  . Gout   . HTN (hypertension)   . Hyperlipidemia   . Nephrolithiasis   . Peripheral neuropathy   . Sleep apnea      Social History:  Social History   Socioeconomic History  . Marital status: Married    Spouse name: Not on file  . Number of children: 2  . Years of education: Not on file  . Highest education level: Not on file  Occupational History  . Occupation: Retired-Linguistics Professor [employer]  Tobacco Use  . Smoking status: Former Smoker    Packs/day: 1.00    Years: 14.00    Pack years: 14.00    Quit date: 12/26/1973    Years since quitting: 46.4  . Smokeless tobacco: Never Used  Substance and Sexual Activity  . Alcohol use: Yes    Alcohol/week: 14.0 standard drinks    Types: 14 Standard drinks or equivalent per week    Comment: When at [facility] has two glasses of wine per night  . Drug use: No  . Sexual activity: Not Currently  Other Topics Concern  . Not on file  Social History Narrative  . Not on file   Social Determinants of Health   Financial Resource Strain:   . Difficulty of Paying Living Expenses:   Food Insecurity:   . Worried About Charity fundraiser in the Last Year:   . Arboriculturist in the Last Year:   Transportation Needs:   . Film/video editor (Medical):   Marland Kitchen Lack of Transportation (Non-Medical):   Physical Activity:   . Days of Exercise per Week:   . Minutes of Exercise per Session:   Stress:   . Feeling of Stress :   Social Connections:   . Frequency of Communication with Friends and Family:   . Frequency of Social Gatherings with Friends and Family:   . Attends Religious Services:   . Active Member of Clubs or Organizations:   . Attends Archivist Meetings:   Marland Kitchen Marital Status:   Intimate Partner Violence:   . Fear of Current or Ex-Partner:   . Emotionally Abused:   Marland Kitchen Physically Abused:   . Sexually Abused:     Medications:   Current Outpatient Medications on File Prior to Visit  Medication Sig Dispense Refill  . allopurinol (ZYLOPRIM) 300 MG tablet Take 300 mg by mouth at bedtime.     Marland Kitchen atenolol (TENORMIN) 25 MG tablet Take  25 mg by mouth daily.    Marland Kitchen atorvastatin (LIPITOR) 10 MG tablet Take 10 mg by mouth at bedtime.     Marland Kitchen co-enzyme Q-10 50 MG capsule Take 50 mg by mouth daily.    . colchicine 0.6 MG tablet Take 0.6 mg by mouth daily as needed (FOR GOUT FLARES).     Marland Kitchen diltiazem (CARDIZEM CD) 180 MG 24 hr capsule Take 180 mg by mouth daily.    . Melatonin 3 MG CAPS Take 3 mg by mouth at bedtime.     . Misc Natural Products (OSTEO BI-FLEX JOINT SHIELD PO) Take 1 tablet by mouth 2 (two) times daily.     . Multiple Vitamin (MULTIVITAMIN) tablet Take 1 tablet by mouth daily.    . Omega-3 Fatty Acids (  FISH OIL) 1000 MG CAPS Take 1,000 mg by mouth daily.     . valsartan-hydrochlorothiazide (DIOVAN-HCT) 320-12.5 MG tablet Take 1 tablet by mouth daily.    . vitamin C (ASCORBIC ACID) 500 MG tablet Take 500 mg by mouth daily.    . vitamin E 1000 UNIT capsule Take 1,000 Units by mouth daily.    Marland Kitchen apixaban (ELIQUIS) 5 MG TABS tablet Take 1 tablet (5 mg total) by mouth 2 (two) times daily. 60 tablet 1   No current facility-administered medications on file prior to visit.    Allergies:   Allergies  Allergen Reactions  . Peanuts [Peanut Oil] Anaphylaxis  . Eggs Or Egg-Derived Products Hives, Itching and Swelling    Physical Exam Today's Vitals   05/22/20 0936  BP: (!) 156/86  Pulse: 69  Weight: 256 lb (116.1 kg)  Height: 5\' 9"  (1.753 m)   Body mass index is 37.8 kg/m.     General: well developed, well nourished elderly mildly obese Caucasian male seated, in no evident distress Head: head normocephalic and atraumatic.  Neck: supple with no carotid or supraclavicular bruits Cardiovascular: irregular rate and rhythm, no murmurs Musculoskeletal: no deformity Skin:  no rash/petichiae Vascular:  Normal pulses all extremities   Neurologic Exam Mental Status: Awake and fully alert.  Occasional stuttering.  No evidence of dysarthria or aphasia.  Oriented to place and time. Recent and remote memory intact.  Attention span, concentration and fund of knowledge appropriate. Mood and affect appropriate.  Cranial Nerves: Pupils equal, briskly reactive to light. Extraocular movements full without nystagmus. Visual fields full to confrontation. Hearing intact. Facial sensation intact. Face, tongue, palate moves normally and symmetrically.  Motor: Normal bulk and tone. Normal strength in all tested extremity muscles. Sensory.: intact to touch ,pinprick .position and vibratory sensation.  Coordination: Rapid alternating movements normal in all extremities. Finger-to-nose and heel-to-shin performed accurately bilaterally. Gait and Station: Arises from chair without difficulty. Stance is normal. Gait demonstrates normal stride length and balance .  Unable to perform heel, toe and tandem walk  Reflexes: 1+ and symmetric. Toes downgoing.       ASSESSMENT: 79 year old Caucasian male with embolic left MCA branch infarcts in November 2020 secondary to persistent atrial fibrillation with Xarelto failure therefore he was switched to Eliquis.  Recovered well without residual deficits.  Vascular risk factors of persistent atrial fibrillation, hypertension hyperlipidemia and sleep apnea on CPAP.  Underlying history of chronic peripheral neuropathy which at times can impact balance but reports overall stable symptoms.     PLAN: Continue Eliquis (apixaban) daily and atorvastatin for secondary stroke prevention  Ongoing compliance with CPAP for mixed sleep apnea management Continue to follow PCP for HTN and HLD management Continue to follow with cardiology for atrial fibrillation monitoring and Eliquis management maintain strict control of hypertension with blood pressure goal below 130/90, diabetes with hemoglobin A1c goal below 6.5% and lipids with LDL cholesterol goal below 70 mg/dL.  I also advised the patient to eat a healthy diet with plenty of whole grains, cereals, fruits and vegetables, exercise regularly and  maintain ideal body weight    Overall stable from stroke standpoint and recommend follow-up as needed  I spent 23 minutes of face-to-face and non-face-to-face time with patient.  This included previsit chart review, lab review, study review, order entry, electronic health record documentation, patient education   Frann Rider, [facility]  [facility] [address] [place], [place] [address]  Phone [phone] Fax [phone] Note: This document  was prepared with digital dictation and possible smart phrase technology. Any transcriptional errors that result from this process are unintentional.

## 2020-05-23 NOTE — Progress Notes (Signed)
I agree with the above plan 

## 2020-05-28 NOTE — Telephone Encounter (Signed)
Order placed for ct angio chest/aorta. BMET to be done prior. Yearly appointment scheduled for Nov as planned.

## 2020-06-19 DIAGNOSIS — Z8673 Personal history of transient ischemic attack (TIA), and cerebral infarction without residual deficits: Secondary | ICD-10-CM | POA: Insufficient documentation

## 2020-07-11 ENCOUNTER — Other Ambulatory Visit: Payer: Self-pay

## 2020-07-11 ENCOUNTER — Ambulatory Visit (INDEPENDENT_AMBULATORY_CARE_PROVIDER_SITE_OTHER)
Admission: RE | Admit: 2020-07-11 | Discharge: 2020-07-11 | Disposition: A | Discharge status: Home / Self Care | Payer: Medicare PPO | Source: Ambulatory Visit | Attending: Cardiovascular Disease | Admitting: Cardiovascular Disease

## 2020-07-11 DIAGNOSIS — I712 Thoracic aortic aneurysm, without rupture, unspecified: Secondary | ICD-10-CM

## 2020-07-11 MED ORDER — IOHEXOL 350 MG/ML SOLN
100.0000 mL | Freq: Once | INTRAVENOUS | Status: AC | PRN
  Administered 2020-07-11: 100 mL via INTRAVENOUS

## 2020-07-14 ENCOUNTER — Other Ambulatory Visit: Payer: Medicare PPO

## 2020-07-21 ENCOUNTER — Inpatient Hospital Stay: Admission: RE | Admit: 2020-07-21 | Payer: Medicare PPO | Source: Ambulatory Visit

## 2020-10-01 DIAGNOSIS — Z23 Encounter for immunization: Secondary | ICD-10-CM | POA: Diagnosis not present

## 2020-10-29 ENCOUNTER — Encounter: Payer: Self-pay | Admitting: Cardiovascular Disease

## 2020-10-29 ENCOUNTER — Other Ambulatory Visit: Payer: Self-pay

## 2020-10-29 ENCOUNTER — Ambulatory Visit: Payer: Medicare PPO | Admitting: Cardiovascular Disease

## 2020-10-29 VITALS — BP 136/82 | HR 73 | Ht 69.0 in | Wt 261.2 lb

## 2020-10-29 DIAGNOSIS — I712 Thoracic aortic aneurysm, without rupture, unspecified: Secondary | ICD-10-CM

## 2020-10-29 DIAGNOSIS — I10 Essential (primary) hypertension: Secondary | ICD-10-CM

## 2020-10-29 DIAGNOSIS — I4819 Other persistent atrial fibrillation: Secondary | ICD-10-CM | POA: Diagnosis not present

## 2020-10-29 MED ORDER — APIXABAN 5 MG PO TABS: 5.0000 mg | ORAL_TABLET | Freq: Two times a day (BID) | ORAL | 3 refills | Status: DC

## 2020-10-29 NOTE — Progress Notes (Signed)
Chief Complaint  Patient presents with  . Follow-up    atrial fib   History of Present Illness: 79 yo male with history of paroxysmal atrial fibrillation, HTN, HLD, gout, sleep apnea and thoracic aortic aneurysm here today for cardiac follow up. I met him in July 2015. He had onset of atrial fib with RVR in 2008 and was converted to sinus with IV diltiazem. He had maintained sinus on Cardizem and atenolol but had not been on long term anticoagulation. He was seen in August 2016 and was in atrial fibrillation. Xarelto was started. Echo 10/15/15 with normal LV function, no valve disease. Plan was for rate control and anti-coagulation. He is known to have a mildly dilated aortic root, 4.3 cm by CTA August 2020. He was admitted to [facility] 10/27/19 with an acute CVA. Echo 10/28/19 with LVEF=55%, mild MR. Carotid artery dopplers 10/30/19 with mild bilateral carotid artery disease. Neurology changed his Xarelto to Eliquis.   He is here today for follow up. The patient denies any chest pain, dyspnea, palpitations, lower extremity edema, orthopnea, PND, dizziness, near syncope or syncope.    Primary Care Physician: Velna Hatchet, MD   Past Medical History:  Diagnosis Date  . Abdominal aortic aneurysm (AAA) ([place]) 2017   Per patient he has AAA and has regular scans with Cardiology following  . Atrial fibrillation ([place])    2008  . Cancer ([place])    skin  . Cholecystitis 10/2018  . Gout   . HTN (hypertension)   . Hyperlipidemia   . Nephrolithiasis   . Peripheral neuropathy   . Sleep apnea     Past Surgical History:  Procedure Laterality Date  . ARM SKIN LESION BIOPSY / EXCISION Right 8 15  . CHOLECYSTECTOMY N/A 11/01/2018   Procedure: LAPAROSCOPIC CHOLECYSTECTOMY;  Surgeon: Clovis Riley, MD;  Location: [place];  Service: General;  Laterality: N/A;  . [place]  . SKIN CANCER EXCISION  2008, 07/2014  . TONSILECTOMY/ADENOIDECTOMY WITH MYRINGOTOMY  1950  . WISDOM TOOTH EXTRACTION       Current Outpatient Medications  Medication Sig Dispense Refill  . allopurinol (ZYLOPRIM) 300 MG tablet Take 300 mg by mouth at bedtime.     Marland Kitchen apixaban (ELIQUIS) 5 MG TABS tablet Take 1 tablet (5 mg total) by mouth 2 (two) times daily. 180 tablet 3  . atenolol (TENORMIN) 25 MG tablet Take 25 mg by mouth daily.    Marland Kitchen atorvastatin (LIPITOR) 10 MG tablet Take 10 mg by mouth at bedtime.     Marland Kitchen co-enzyme Q-10 50 MG capsule Take 50 mg by mouth daily.    . colchicine 0.6 MG tablet Take 0.6 mg by mouth daily as needed (FOR GOUT FLARES).     Marland Kitchen diltiazem (CARDIZEM CD) 180 MG 24 hr capsule Take 180 mg by mouth daily.    . Melatonin 3 MG CAPS Take 3 mg by mouth at bedtime.     . Misc Natural Products (OSTEO BI-FLEX JOINT SHIELD PO) Take 1 tablet by mouth 2 (two) times daily.     . Multiple Vitamin (MULTIVITAMIN) tablet Take 1 tablet by mouth daily.    . Omega-3 Fatty Acids (FISH OIL) 1000 MG CAPS Take 1,000 mg by mouth daily.     . valsartan-hydrochlorothiazide (DIOVAN-HCT) 320-12.5 MG tablet Take 1 tablet by mouth daily.    . vitamin C (ASCORBIC ACID) 500 MG tablet Take 500 mg by mouth daily.    . vitamin E 1000 UNIT capsule Take  1,000 Units by mouth daily.     No current facility-administered medications for this visit.    Allergies  Allergen Reactions  . Peanuts [Peanut Oil] Anaphylaxis  . Eggs Or Egg-Derived Products Hives, Itching and Swelling    Social History   Socioeconomic History  . Marital status: Married    Spouse name: Not on file  . Number of children: 2  . Years of education: Not on file  . Highest education level: Not on file  Occupational History  . Occupation: Retired-Linguistics Professor [employer]  Tobacco Use  . Smoking status: Former Smoker    Packs/day: 1.00    Years: 14.00    Pack years: 14.00    Quit date: 12/26/1973    Years since quitting: 46.8  . Smokeless tobacco: Never Used  Vaping Use  . Vaping Use: Never used  Substance and Sexual Activity  . Alcohol use:  Yes    Alcohol/week: 14.0 standard drinks    Types: 14 Standard drinks or equivalent per week    Comment: When at [facility] has two glasses of wine per night  . Drug use: No  . Sexual activity: Not Currently  Other Topics Concern  . Not on file  Social History Narrative  . Not on file   Social Determinants of Health   Financial Resource Strain:   . Difficulty of Paying Living Expenses: Not on file  Food Insecurity:   . Worried About Charity fundraiser in the Last Year: Not on file  . Ran Out of Food in the Last Year: Not on file  Transportation Needs:   . Lack of Transportation (Medical): Not on file  . Lack of Transportation (Non-Medical): Not on file  Physical Activity:   . Days of Exercise per Week: Not on file  . Minutes of Exercise per Session: Not on file  Stress:   . Feeling of Stress : Not on file  Social Connections:   . Frequency of Communication with Friends and Family: Not on file  . Frequency of Social Gatherings with Friends and Family: Not on file  . Attends Religious Services: Not on file  . Active Member of Clubs or Organizations: Not on file  . Attends Archivist Meetings: Not on file  . Marital Status: Not on file  Intimate Partner Violence:   . Fear of Current or Ex-Partner: Not on file  . Emotionally Abused: Not on file  . Physically Abused: Not on file  . Sexually Abused: Not on file    Family History  Problem Relation Age of Onset  . Heart attack Father   . Heart attack Mother   . Colon cancer Neg Hx   . Esophageal cancer Neg Hx   . Rectal cancer Neg Hx   . Stomach cancer Neg Hx     Review of Systems:  As stated in the HPI and otherwise negative.   BP 136/82   Pulse 73   Ht 5' 9"  (1.753 m)   Wt 261 lb 3.2 oz (118.5 kg)   SpO2 97%   BMI 38.57 kg/m   Physical Examination:  General: Well developed, well nourished, NAD  HEENT: OP clear, mucus membranes moist  SKIN: warm, dry. No rashes. Neuro: No focal deficits   Musculoskeletal: Muscle strength 5/5 all ext  Psychiatric: Mood and affect normal  Neck: No JVD, no carotid bruits, no thyromegaly, no lymphadenopathy.  Lungs:Clear bilaterally, no wheezes, rhonci, crackles Cardiovascular: Regular rate and rhythm. No murmurs, gallops or rubs.  Abdomen:Soft. Bowel sounds present. Non-tender.  Extremities: No lower extremity edema. Pulses are 2 + in the bilateral DP/PT.  EKG:  EKG is ordered today. The ekg ordered today demonstrates Atrial fibrillation, rate 73 bpm  Echo 10/28/19:  1. Left ventricular ejection fraction, by visual estimation, is 55%. The left ventricle has normal function. There is mildly increased left ventricular hypertrophy.  2. Left ventricular diastolic parameters are indeterminate in the setting of atrial fibrillation/flutter.  3. Global right ventricle has mildly reduced systolic function.The right ventricular size is normal. No increase in right ventricular wall thickness.  4. Left atrial size was moderately dilated.  5. Right atrial size was normal.  6. Mild aortic valve annular calcification.  7. Mild mitral annular calcification.  8. The mitral valve is grossly normal. Mild mitral valve regurgitation.  9. The tricuspid valve is grossly normal. Tricuspid valve regurgitation is mild. 10. The aortic valve is tricuspid. Aortic valve regurgitation is not visualized. Mild aortic valve sclerosis without stenosis. 11. The pulmonic valve was grossly normal. Pulmonic valve regurgitation is trivial. 12. Mildly elevated pulmonary artery systolic pressure. 13. The inferior vena cava is dilated in size with >50% respiratory variability, suggesting right atrial pressure of 8 mmHg. 14. The tricuspid regurgitant velocity is 2.76 m/s, and with an assumed right atrial pressure of 8 mmHg, the estimated right ventricular systolic pressure is mildly elevated at 38.5 mmHg.  Recent Labs: No results found for requested labs within last 8760 hours.    Wt  Readings from Last 3 Encounters:  10/29/20 261 lb 3.2 oz (118.5 kg)  05/22/20 256 lb (116.1 kg)  11/20/19 255 lb (115.7 kg)     Other studies Reviewed: Additional studies/ records that were reviewed today include: . Review of the above records demonstrates:   Assessment and Plan:   1. Persistent atrial fibrillation: He is in atrial fibrillation today. Rate is well controlled. Will continue Cardizem, atenolol and Eliquis.   2. Thoracic aortic aneurysm (ascending aorta): Stable mild dilation of ascending aorta at 4.3 cm by CTA July 2021. Repeat July 2023 since it has been stable for years.   3. Coronary artery calcification: Noted on CTA chest. He has no symptoms c/w angina and is very active. LIkely that this represents calcification of his artery wall only without significant luminal obstruction. No plans for ischemic workup at this time.   4. HTN: BP is well controlled. Continue current therapy  5. Carotid artery disease: Mild bilateral disease by carotid artery dopplers November 2020.  6. History of CVA November 2020: Fully recovered. Xarelto changed to Eliquis by Neurology.     Current medicines are reviewed at length with the patient today.  The patient does not have concerns regarding medicines.  The following changes have been made:   Labs/ tests ordered today include  Orders Placed This Encounter  Procedures  . EKG 12-Lead    Disposition:   F/U with me in 12 months   Signed, Lauree Chandler, MD 10/29/2020 11:03 AM    [facility] [place], [place], [place]  [address] Phone: [phone]; Fax: [phone]

## 2020-10-29 NOTE — Patient Instructions (Signed)

## 2020-12-24 DIAGNOSIS — Z20822 Contact with and (suspected) exposure to covid-19: Secondary | ICD-10-CM | POA: Diagnosis not present

## 2021-01-07 DIAGNOSIS — Z85828 Personal history of other malignant neoplasm of skin: Secondary | ICD-10-CM | POA: Diagnosis not present

## 2021-01-07 DIAGNOSIS — I8312 Varicose veins of left lower extremity with inflammation: Secondary | ICD-10-CM | POA: Diagnosis not present

## 2021-01-07 DIAGNOSIS — D224 Melanocytic nevi of scalp and neck: Secondary | ICD-10-CM | POA: Diagnosis not present

## 2021-01-07 DIAGNOSIS — D044 Carcinoma in situ of skin of scalp and neck: Secondary | ICD-10-CM | POA: Diagnosis not present

## 2021-01-07 DIAGNOSIS — I872 Venous insufficiency (chronic) (peripheral): Secondary | ICD-10-CM | POA: Diagnosis not present

## 2021-01-07 DIAGNOSIS — D2262 Melanocytic nevi of left upper limb, including shoulder: Secondary | ICD-10-CM | POA: Diagnosis not present

## 2021-01-07 DIAGNOSIS — I8311 Varicose veins of right lower extremity with inflammation: Secondary | ICD-10-CM | POA: Diagnosis not present

## 2021-01-07 DIAGNOSIS — D1801 Hemangioma of skin and subcutaneous tissue: Secondary | ICD-10-CM | POA: Diagnosis not present

## 2021-01-07 DIAGNOSIS — D225 Melanocytic nevi of trunk: Secondary | ICD-10-CM | POA: Diagnosis not present

## 2021-01-07 DIAGNOSIS — L570 Actinic keratosis: Secondary | ICD-10-CM | POA: Diagnosis not present

## 2021-01-12 DIAGNOSIS — G4733 Obstructive sleep apnea (adult) (pediatric): Secondary | ICD-10-CM | POA: Diagnosis not present

## 2021-02-23 DIAGNOSIS — H5212 Myopia, left eye: Secondary | ICD-10-CM | POA: Diagnosis not present

## 2021-02-23 DIAGNOSIS — H40053 Ocular hypertension, bilateral: Secondary | ICD-10-CM | POA: Diagnosis not present

## 2021-03-23 DIAGNOSIS — H40031 Anatomical narrow angle, right eye: Secondary | ICD-10-CM | POA: Diagnosis not present

## 2021-03-25 DIAGNOSIS — H2000 Unspecified acute and subacute iridocyclitis: Secondary | ICD-10-CM | POA: Diagnosis not present

## 2021-03-25 DIAGNOSIS — G4733 Obstructive sleep apnea (adult) (pediatric): Secondary | ICD-10-CM | POA: Diagnosis not present

## 2021-04-20 DIAGNOSIS — G4733 Obstructive sleep apnea (adult) (pediatric): Secondary | ICD-10-CM | POA: Diagnosis not present

## 2021-06-13 IMAGING — MR MR HEAD W/O CM
12 of 13 series · 44 of 48 positions shown · non-contrast
Comparison: None.

CLINICAL DATA: 78-year-old male with near syncope. Weakness
dizziness and diaphoresis.

EXAM:
MRI HEAD WITHOUT CONTRAST
TECHNIQUE: Multiplanar, multiecho pulse sequences of the brain and surrounding
structures were obtained without intravenous contrast.

[Series 5: DWI · axial · 3.0mm · 0.92mm/px · z∈[-70,+88]mm · 7 of 108 slices shown (1 of 4)]
[im 1/108]
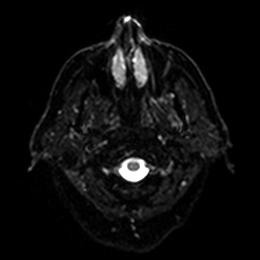
[im 18/108]
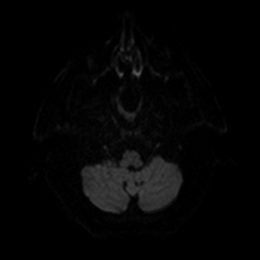
[im 36/108]
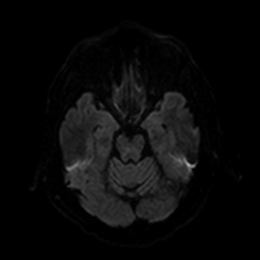
[im 54/108]
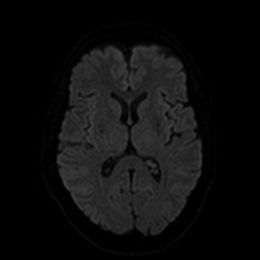
[im 72/108]
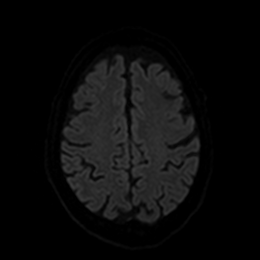
[im 90/108]
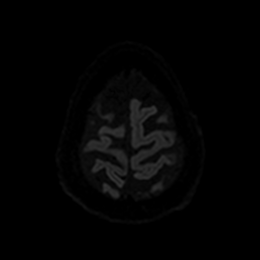
[im 108/108]
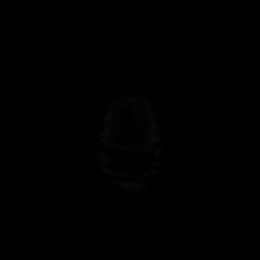

[Series 6: DWI · axial · 3.0mm · 0.92mm/px · z∈[-70,+88]mm · 4 of 53 slices shown (2 of 4)]
[im 1/53]
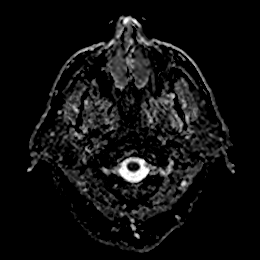
[im 18/53]
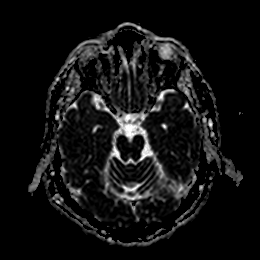
[im 35/53]
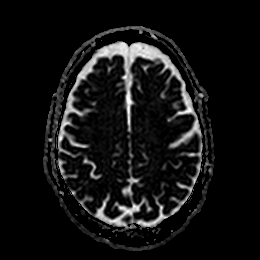
[im 53/53]
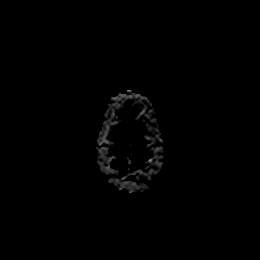

[Series 7: DWI · coronal · 4.0mm · 0.88mm/px · 6 of 82 slices shown (3 of 4)]
[im 1/82]
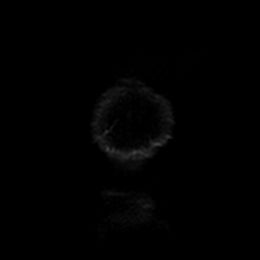
[im 17/82]
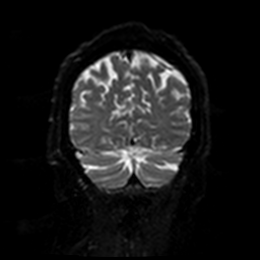
[im 33/82]
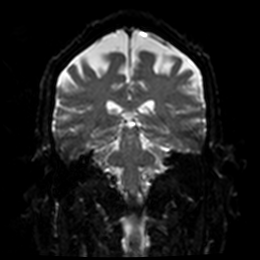
[im 49/82]
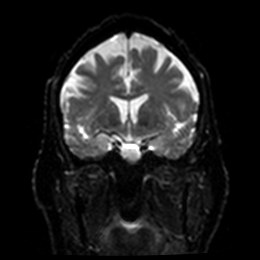
[im 65/82]
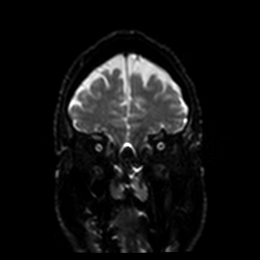
[im 82/82]
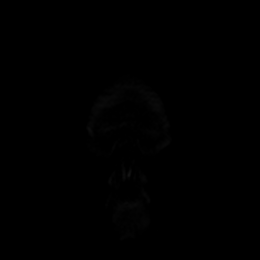

[Series 8: DWI · coronal · 4.0mm · 0.88mm/px · 3 of 41 slices shown (4 of 4)]
[im 1/41]
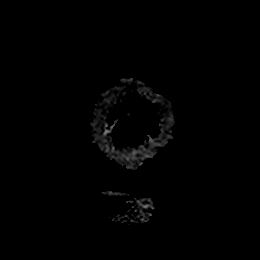
[im 21/41]
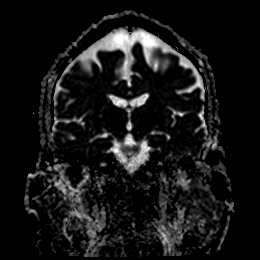
[im 41/41]
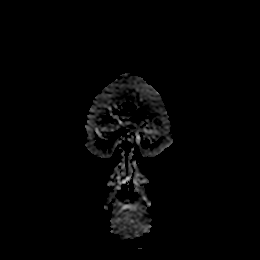

[Series 9: FLAIR · axial · 5.0mm · 0.47mm/px · z∈[-68,+87]mm · 2 of 27 slices shown]
[im 1/27]
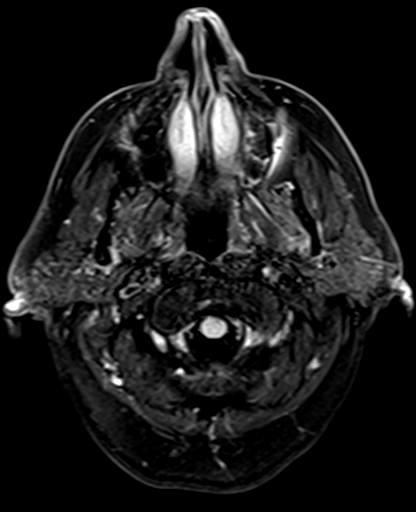
[im 27/27]
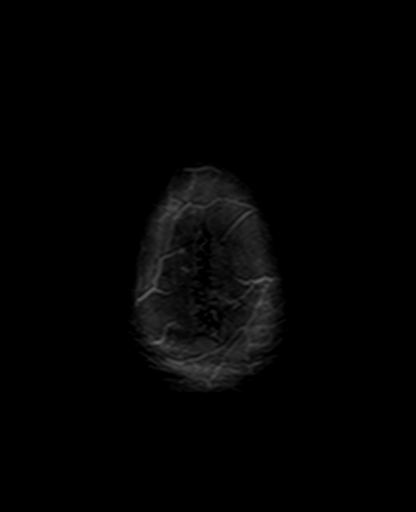

[Series 10: mag_images · axial · 3.0mm · 0.94mm/px · z∈[-72,+92]mm · 4 of 56 slices shown]
[im 1/56]
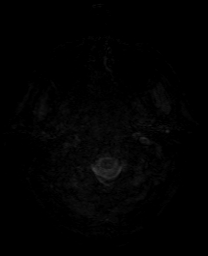
[im 19/56]
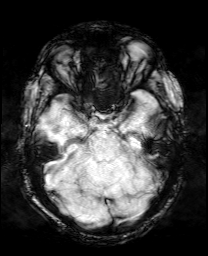
[im 37/56]
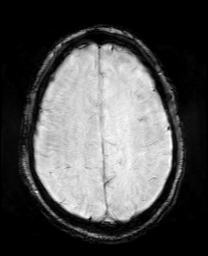
[im 56/56]
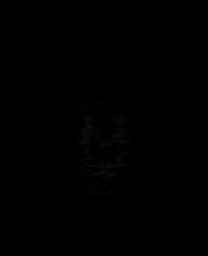

[Series 11: pha_images · axial · 3.0mm · 0.94mm/px · z∈[-72,+92]mm · 4 of 56 slices shown]
[im 1/56]
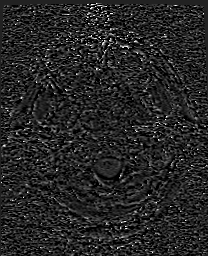
[im 19/56]
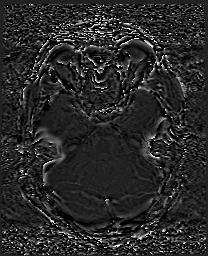
[im 37/56]
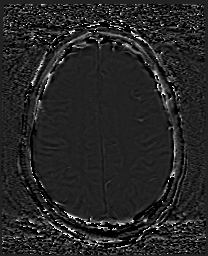
[im 56/56]
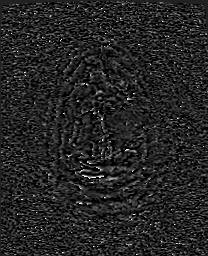

[Series 12: swi_images · axial · 3.0mm · 0.94mm/px · z∈[-72,+92]mm · 4 of 56 slices shown]
[im 1/56]
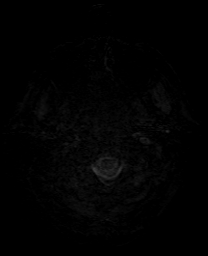
[im 19/56]
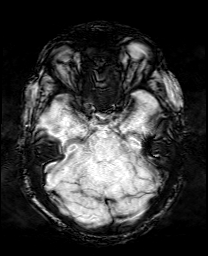
[im 37/56]
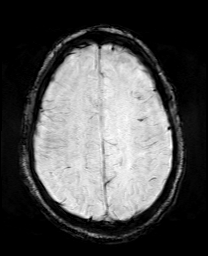
[im 56/56]
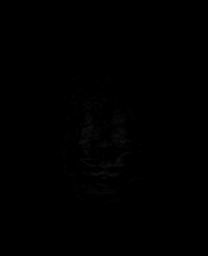

[Series 13: mip_images(sw) · axial · 24.0mm · 0.94mm/px · z∈[-62,+81]mm · 4 of 49 slices shown]
[im 1/49]
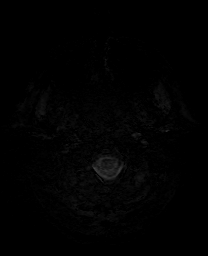
[im 17/49]
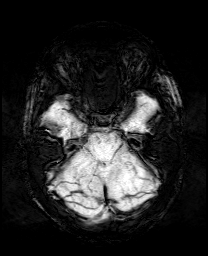
[im 33/49]
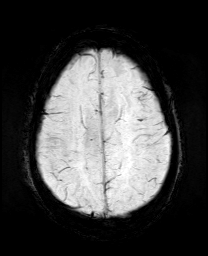
[im 49/49]
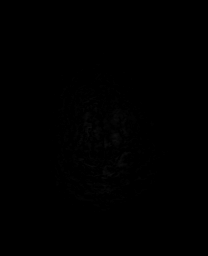

[Series 14: T1 · sagittal · 5.0mm · 0.75mm/px · 2 of 26 slices shown]
[im 1/26]
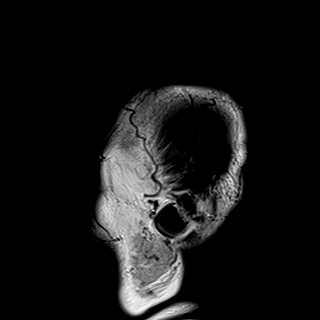
[im 26/26]
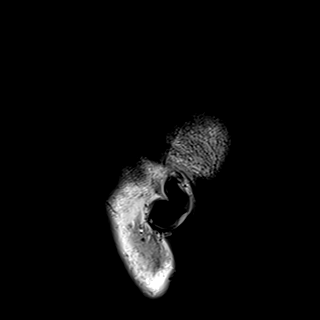

[Series 15: T2 · axial · 5.0mm · 0.75mm/px · z∈[-69,+87]mm · 2 of 27 slices shown (1 of 2)]
[im 1/27]
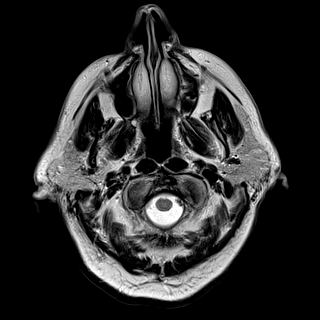
[im 27/27]
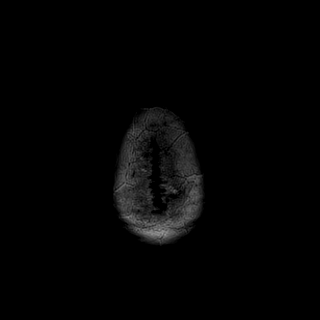

[Series 17: T2 · coronal · 5.0mm · 0.34mm/px · 2 of 33 slices shown (2 of 2)]
[im 1/33]
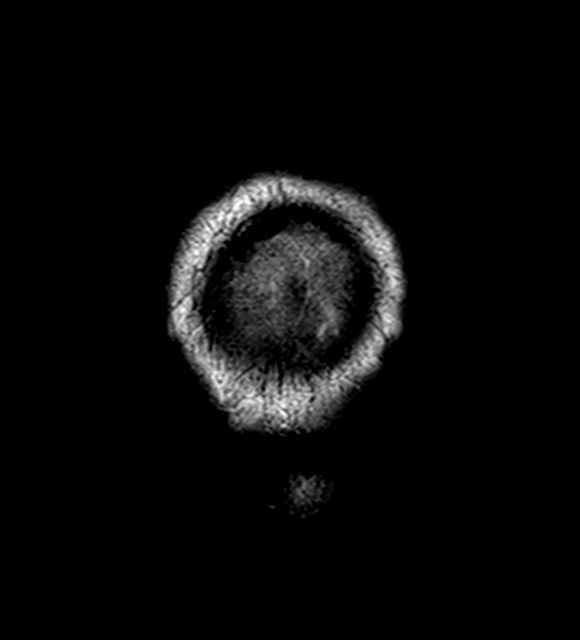
[im 33/33]
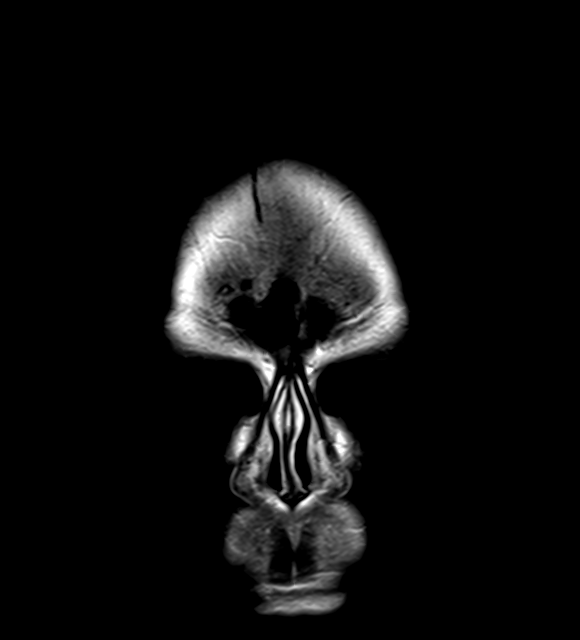

[44 of 48 positions shown; findings below may reference images not displayed]

FINDINGS: Brain: There are several punctate foci of restricted diffusion in
the anterior superior left parietal lobe near the perirolandic
cortex (series 5, images 95 and 96). Mild if any associated T2 and
FLAIR hyperintensity. No hemorrhage or mass effect.

Nearby small area of chronic cortical encephalomalacia in the left
motor strip on series 9, image 21. similar area in the more anterior
left middle frontal gyrus on image 20.

Possible chronic microhemorrhage in the left frontal lobe
subcortical white matter on series 12, image 36, but otherwise there
is normal for age gray and white matter signal aside from the above
findings. No other restricted diffusion. No midline shift, mass
effect, evidence of mass lesion, ventriculomegaly, extra-axial
collection or acute intracranial hemorrhage. Cervicomedullary
junction within normal limits. Partially empty sella.

Vascular: Major intracranial vascular flow voids are preserved. The
left vertebral artery appears dominant and the right diminutive.

Skull and upper cervical spine: Negative for age visible cervical
spine. Normal bone marrow signal.

Sinuses/Orbits: Negative orbits. Paranasal sinuses are well
pneumatized.

Other: Mastoids are clear. Visible internal auditory structures
appear normal. Negative scalp and face soft tissues.
IMPRESSION: 1. Positive for several punctate acute infarcts in the posterior
left MCA territory, superimposed on several small chronic cortical
infarcts in that region. No associated hemorrhage or mass effect.

2. Elsewhere largely unremarkable for age non-contrast MRI
appearance of the brain.

## 2021-06-23 DIAGNOSIS — Z125 Encounter for screening for malignant neoplasm of prostate: Secondary | ICD-10-CM | POA: Diagnosis not present

## 2021-06-23 DIAGNOSIS — R7309 Other abnormal glucose: Secondary | ICD-10-CM | POA: Diagnosis not present

## 2021-06-23 DIAGNOSIS — M109 Gout, unspecified: Secondary | ICD-10-CM | POA: Diagnosis not present

## 2021-06-23 DIAGNOSIS — E785 Hyperlipidemia, unspecified: Secondary | ICD-10-CM | POA: Diagnosis not present

## 2021-06-30 DIAGNOSIS — I10 Essential (primary) hypertension: Secondary | ICD-10-CM | POA: Diagnosis not present

## 2021-06-30 DIAGNOSIS — G629 Polyneuropathy, unspecified: Secondary | ICD-10-CM | POA: Diagnosis not present

## 2021-06-30 DIAGNOSIS — Z0000 Encounter for general adult medical examination without abnormal findings: Secondary | ICD-10-CM | POA: Diagnosis not present

## 2021-06-30 DIAGNOSIS — I712 Thoracic aortic aneurysm, without rupture: Secondary | ICD-10-CM | POA: Diagnosis not present

## 2021-06-30 DIAGNOSIS — Z1331 Encounter for screening for depression: Secondary | ICD-10-CM | POA: Diagnosis not present

## 2021-06-30 DIAGNOSIS — K921 Melena: Secondary | ICD-10-CM | POA: Diagnosis not present

## 2021-06-30 DIAGNOSIS — R82998 Other abnormal findings in urine: Secondary | ICD-10-CM | POA: Diagnosis not present

## 2021-06-30 DIAGNOSIS — E785 Hyperlipidemia, unspecified: Secondary | ICD-10-CM | POA: Diagnosis not present

## 2021-06-30 DIAGNOSIS — Z1339 Encounter for screening examination for other mental health and behavioral disorders: Secondary | ICD-10-CM | POA: Diagnosis not present

## 2021-06-30 DIAGNOSIS — I872 Venous insufficiency (chronic) (peripheral): Secondary | ICD-10-CM | POA: Diagnosis not present

## 2021-06-30 DIAGNOSIS — R2689 Other abnormalities of gait and mobility: Secondary | ICD-10-CM | POA: Diagnosis not present

## 2021-06-30 DIAGNOSIS — L97919 Non-pressure chronic ulcer of unspecified part of right lower leg with unspecified severity: Secondary | ICD-10-CM | POA: Diagnosis not present

## 2021-07-02 ENCOUNTER — Other Ambulatory Visit (HOSPITAL_COMMUNITY): Payer: Self-pay | Admitting: Internal Medicine

## 2021-07-02 DIAGNOSIS — L97919 Non-pressure chronic ulcer of unspecified part of right lower leg with unspecified severity: Secondary | ICD-10-CM

## 2021-07-06 ENCOUNTER — Other Ambulatory Visit: Payer: Self-pay

## 2021-07-06 ENCOUNTER — Ambulatory Visit (HOSPITAL_COMMUNITY)
Admission: RE | Admit: 2021-07-06 | Discharge: 2021-07-06 | Disposition: A | Discharge status: Home / Self Care | Payer: Medicare PPO | Source: Ambulatory Visit | Attending: Surgery | Admitting: Surgery

## 2021-07-06 DIAGNOSIS — L97919 Non-pressure chronic ulcer of unspecified part of right lower leg with unspecified severity: Secondary | ICD-10-CM | POA: Diagnosis not present

## 2021-07-07 DIAGNOSIS — D225 Melanocytic nevi of trunk: Secondary | ICD-10-CM | POA: Diagnosis not present

## 2021-07-07 DIAGNOSIS — L570 Actinic keratosis: Secondary | ICD-10-CM | POA: Diagnosis not present

## 2021-07-07 DIAGNOSIS — I8312 Varicose veins of left lower extremity with inflammation: Secondary | ICD-10-CM | POA: Diagnosis not present

## 2021-07-07 DIAGNOSIS — Z85828 Personal history of other malignant neoplasm of skin: Secondary | ICD-10-CM | POA: Diagnosis not present

## 2021-07-07 DIAGNOSIS — I8311 Varicose veins of right lower extremity with inflammation: Secondary | ICD-10-CM | POA: Diagnosis not present

## 2021-07-07 DIAGNOSIS — D1801 Hemangioma of skin and subcutaneous tissue: Secondary | ICD-10-CM | POA: Diagnosis not present

## 2021-07-07 DIAGNOSIS — I872 Venous insufficiency (chronic) (peripheral): Secondary | ICD-10-CM | POA: Diagnosis not present

## 2021-07-07 DIAGNOSIS — L814 Other melanin hyperpigmentation: Secondary | ICD-10-CM | POA: Diagnosis not present

## 2021-07-07 DIAGNOSIS — L821 Other seborrheic keratosis: Secondary | ICD-10-CM | POA: Diagnosis not present

## 2021-07-20 DIAGNOSIS — G4733 Obstructive sleep apnea (adult) (pediatric): Secondary | ICD-10-CM | POA: Diagnosis not present

## 2021-08-17 DIAGNOSIS — H401111 Primary open-angle glaucoma, right eye, mild stage: Secondary | ICD-10-CM | POA: Diagnosis not present

## 2021-09-07 DIAGNOSIS — G4733 Obstructive sleep apnea (adult) (pediatric): Secondary | ICD-10-CM | POA: Diagnosis not present

## 2021-10-07 DIAGNOSIS — G4733 Obstructive sleep apnea (adult) (pediatric): Secondary | ICD-10-CM | POA: Diagnosis not present

## 2021-10-16 ENCOUNTER — Other Ambulatory Visit: Payer: Self-pay | Admitting: Cardiovascular Disease

## 2021-10-16 NOTE — Telephone Encounter (Signed)
Prescription refill request for Eliquis received. Indication:Afib  Last office visit:10/29/20 ([place])  Scr: 1.3 (06/23/21 via PCP office)  Age: 80 Weight: 118.5kg  Appropriate dose and refill sent to requested pharmacy.

## 2021-11-19 DIAGNOSIS — G4733 Obstructive sleep apnea (adult) (pediatric): Secondary | ICD-10-CM | POA: Diagnosis not present

## 2021-11-22 NOTE — Progress Notes (Signed)
Chief Complaint  Patient presents with   Follow-up    Atrial fib    History of Present Illness: 80 yo male with history of paroxysmal atrial fibrillation, HTN, HLD, gout, sleep apnea and thoracic aortic aneurysm here today for cardiac follow up. I met him in July 2015. He had onset of atrial fib with RVR in 2008 and was converted to sinus with IV diltiazem. He had maintained sinus on Cardizem and atenolol but had not been on long term anticoagulation. He was seen in August 2016 and was in atrial fibrillation. Xarelto was started in 2016. Echo 10/15/15 with normal LV function, no valve disease. Plan was for rate control and anti-coagulation. He is known to have a mildly dilated aortic root, 4.3 cm by CTA August 2020 and by repeat CTA in July 2021. He was admitted to [facility] 10/27/19 with an acute CVA. Echo 10/28/19 with LVEF=55%, mild MR. Carotid artery dopplers 10/30/19 with mild bilateral carotid artery disease. Neurology changed his Xarelto to Eliquis.   He is here today for follow up. The patient denies any chest pain, dyspnea, palpitations, lower extremity edema, orthopnea, PND, dizziness, near syncope or syncope. Some LE edema that resolves at night.    Primary Care Physician: Velna Hatchet, MD  Past Medical History:  Diagnosis Date   Abdominal aortic aneurysm (AAA) 2017   Per patient he has AAA and has regular scans with Cardiology following   Atrial fibrillation ([place])    2008   Cancer [facility])    skin   Cholecystitis 10/2018   Gout    HTN (hypertension)    Hyperlipidemia    Nephrolithiasis    Peripheral neuropathy    Sleep apnea     Past Surgical History:  Procedure Laterality Date   ARM SKIN LESION BIOPSY / EXCISION Right 8 15   CHOLECYSTECTOMY N/A 11/01/2018   Procedure: LAPAROSCOPIC CHOLECYSTECTOMY;  Surgeon: Clovis Riley, MD;  Location: [place];  Service: General;  Laterality: N/A;   [place]  2008, 07/2014   TONSILECTOMY/ADENOIDECTOMY  WITH MYRINGOTOMY  1950   WISDOM TOOTH EXTRACTION      Current Outpatient Medications  Medication Sig Dispense Refill   allopurinol (ZYLOPRIM) 300 MG tablet Take 300 mg by mouth at bedtime.      atenolol (TENORMIN) 25 MG tablet Take 25 mg by mouth daily.     atorvastatin (LIPITOR) 10 MG tablet Take 10 mg by mouth at bedtime.      co-enzyme Q-10 50 MG capsule Take 50 mg by mouth daily.     colchicine 0.6 MG tablet Take 0.6 mg by mouth daily as needed (FOR GOUT FLARES).      diltiazem (CARDIZEM CD) 180 MG 24 hr capsule Take 180 mg by mouth daily.     ELIQUIS 5 MG TABS tablet TAKE ONE TABLET BY MOUTH TWICE A DAY 180 tablet 3   furosemide (LASIX) 40 MG tablet Take 1 tablet (40 mg total) by mouth daily as needed. 90 tablet 3   loratadine (CLARITIN) 10 MG tablet Take 10 mg by mouth daily.     Melatonin 3 MG CAPS Take 3 mg by mouth at bedtime.      Misc Natural Products (OSTEO BI-FLEX JOINT SHIELD PO) Take 1 tablet by mouth 2 (two) times daily.      Multiple Vitamin (MULTIVITAMIN) tablet Take 1 tablet by mouth daily.     Omega-3 Fatty Acids (FISH OIL) 1000 MG CAPS Take 1,000 mg  by mouth daily.      valsartan-hydrochlorothiazide (DIOVAN-HCT) 320-12.5 MG tablet Take 1 tablet by mouth daily.     vitamin C (ASCORBIC ACID) 500 MG tablet Take 500 mg by mouth daily.     vitamin E 1000 UNIT capsule Take 1,000 Units by mouth daily.     No current facility-administered medications for this visit.    Allergies  Allergen Reactions   Peanuts [Peanut Oil] Anaphylaxis   Eggs Or Egg-Derived Products Hives, Itching and Swelling    Social History   Socioeconomic History   Marital status: Widowed    Spouse name: Not on file   Number of children: 2   Years of education: Not on file   Highest education level: Not on file  Occupational History   Occupation: Retired-Linguistics Professor [employer]  Tobacco Use   Smoking status: Former    Packs/day: 1.00    Years: 14.00    Pack years: 14.00    Types:  Cigarettes    Quit date: 12/26/1973    Years since quitting: 47.9   Smokeless tobacco: Never  Vaping Use   Vaping Use: Never used  Substance and Sexual Activity   Alcohol use: Yes    Alcohol/week: 14.0 standard drinks    Types: 14 Standard drinks or equivalent per week    Comment: When at [facility] has two glasses of wine per night   Drug use: No   Sexual activity: Not Currently  Other Topics Concern   Not on file  Social History Narrative   Not on file   Social Determinants of Health   Financial Resource Strain: Not on file  Food Insecurity: Not on file  Transportation Needs: Not on file  Physical Activity: Not on file  Stress: Not on file  Social Connections: Not on file  Intimate Partner Violence: Not on file    Family History  Problem Relation Age of Onset   Heart attack Father    Heart attack Mother    Colon cancer Neg Hx    Esophageal cancer Neg Hx    Rectal cancer Neg Hx    Stomach cancer Neg Hx     Review of Systems:  As stated in the HPI and otherwise negative.   BP (!) 148/76 (BP Location: Left Arm, Patient Position: Sitting, Cuff Size: Normal)   Pulse 75   Ht 5' 9.5" (1.765 m)   Wt 251 lb 12.8 oz (114.2 kg)   BMI 36.65 kg/m   Physical Examination: General: Well developed, well nourished, NAD  HEENT: OP clear, mucus membranes moist  SKIN: warm, dry. No rashes. Neuro: No focal deficits  Musculoskeletal: Muscle strength 5/5 all ext  Psychiatric: Mood and affect normal  Neck: No JVD, no carotid bruits, no thyromegaly, no lymphadenopathy.  Lungs:Clear bilaterally, no wheezes, rhonci, crackles Cardiovascular:  Irreg irreg. No murmurs, gallops or rubs. Abdomen:Soft. Bowel sounds present. Non-tender.  Extremities: No lower extremity edema. Pulses are 2 + in the bilateral DP/PT.  EKG:  EKG is  ordered today. The ekg ordered today demonstrates atrial fib, rate 75 bpm  Echo 10/28/19:  1. Left ventricular ejection fraction, by visual estimation, is 55%.  The left ventricle has normal function. There is mildly increased left ventricular hypertrophy.  2. Left ventricular diastolic parameters are indeterminate in the setting of atrial fibrillation/flutter.  3. Global right ventricle has mildly reduced systolic function.The right ventricular size is normal. No increase in right ventricular wall thickness.  4. Left atrial size was moderately dilated.  5. Right atrial size was normal.  6. Mild aortic valve annular calcification.  7. Mild mitral annular calcification.  8. The mitral valve is grossly normal. Mild mitral valve regurgitation.  9. The tricuspid valve is grossly normal. Tricuspid valve regurgitation is mild. 10. The aortic valve is tricuspid. Aortic valve regurgitation is not visualized. Mild aortic valve sclerosis without stenosis. 11. The pulmonic valve was grossly normal. Pulmonic valve regurgitation is trivial. 12. Mildly elevated pulmonary artery systolic pressure. 13. The inferior vena cava is dilated in size with >50% respiratory variability, suggesting right atrial pressure of 8 mmHg. 14. The tricuspid regurgitant velocity is 2.76 m/s, and with an assumed right atrial pressure of 8 mmHg, the estimated right ventricular systolic pressure is mildly elevated at 38.5 mmHg.  Recent Labs: No results found for requested labs within last 8760 hours.    Wt Readings from Last 3 Encounters:  11/23/21 251 lb 12.8 oz (114.2 kg)  10/29/20 261 lb 3.2 oz (118.5 kg)  05/22/20 256 lb (116.1 kg)     Other studies Reviewed: Additional studies/ records that were reviewed today include: . Review of the above records demonstrates:   Assessment and Plan:   1. Persistent atrial fibrillation: Atrial fib today. Rate controlled. Continue Cardizem, atenolol and Eliquis.    2. Thoracic aortic aneurysm (ascending aorta): Stable mild dilation of ascending aorta at 4.3 cm by CTA July 2021. Will repeat in July 2023 (since it has been stable for years).    3. Coronary artery calcification: Noted on CTA chest. He has no symptoms c/w angina and is very active. LIkely that this represents calcification of his artery wall only without significant luminal obstruction. No plans for ischemic workup at this time.   4. HTN: BP is controlled at home. Home log reviewed today. Continue current meds  5. Carotid artery disease: Mild bilateral disease by carotid artery dopplers November 2020.  6. History of CVA November 2020: Fully recovered. He remains on Eliquis    7. Lower extremity edema: Likely some component of venous insufficiency and diastolic CHF. Edema resolves at night. Will give him Lasix 40 mg to use as needed.   Current medicines are reviewed at length with the patient today.  The patient does not have concerns regarding medicines.  The following changes have been made:   Labs/ tests ordered today include  Orders Placed This Encounter  Procedures   CT ANGIO CHEST AORTA W/CM & OR WO/CM   Basic metabolic panel   EKG [id number]     Disposition:   F/U with me in 12 months   Signed, Lauree Chandler, MD 11/23/2021 12:41 PM    [facility] [address], [place], [place]  [address] Phone: [phone]; Fax: [phone]

## 2021-11-23 ENCOUNTER — Ambulatory Visit: Payer: Medicare PPO | Admitting: Cardiovascular Disease

## 2021-11-23 ENCOUNTER — Encounter: Payer: Self-pay | Admitting: Cardiovascular Disease

## 2021-11-23 ENCOUNTER — Other Ambulatory Visit: Payer: Self-pay

## 2021-11-23 VITALS — BP 148/76 | HR 75 | Ht 69.5 in | Wt 251.8 lb

## 2021-11-23 DIAGNOSIS — I2510 Atherosclerotic heart disease of native coronary artery without angina pectoris: Secondary | ICD-10-CM

## 2021-11-23 DIAGNOSIS — R600 Localized edema: Secondary | ICD-10-CM | POA: Diagnosis not present

## 2021-11-23 DIAGNOSIS — I4819 Other persistent atrial fibrillation: Secondary | ICD-10-CM

## 2021-11-23 DIAGNOSIS — I10 Essential (primary) hypertension: Secondary | ICD-10-CM | POA: Diagnosis not present

## 2021-11-23 DIAGNOSIS — I7121 Aneurysm of the ascending aorta, without rupture: Secondary | ICD-10-CM

## 2021-11-23 MED ORDER — FUROSEMIDE 40 MG PO TABS: 40.0000 mg | ORAL_TABLET | Freq: Every day | ORAL | 3 refills | Status: DC | PRN

## 2021-11-23 NOTE — Patient Instructions (Signed)
Medication Instructions:  Your physician has recommended you make the following change in your medication:   1.) furosemide (Lasix) 40 mg - take one tablet daily as needed  *If you need a refill on your cardiac medications before your next appointment, please call your pharmacy*   Lab Work: None today.  See below.     Testing/Procedures: Chest CTA Aorta - due July 2023  You will need some blood work within 30 days before the CTA   Follow-Up: At [facility], you and your health needs are our priority.  As part of our continuing mission to provide you with exceptional heart care, we have created designated Provider Care Teams.  These Care Teams include your primary Cardiologist (physician) and Advanced Practice Providers (APPs -  Physician Assistants and Nurse Practitioners) who all work together to provide you with the care you need, when you need it.   Your next appointment:   12 month(s)  The format for your next appointment:   In Person  Provider:   Lauree Chandler, MD     Other Instructions

## 2021-12-03 DIAGNOSIS — Z23 Encounter for immunization: Secondary | ICD-10-CM | POA: Diagnosis not present

## 2021-12-22 DIAGNOSIS — H401111 Primary open-angle glaucoma, right eye, mild stage: Secondary | ICD-10-CM | POA: Diagnosis not present

## 2022-01-07 DIAGNOSIS — L570 Actinic keratosis: Secondary | ICD-10-CM | POA: Diagnosis not present

## 2022-01-07 DIAGNOSIS — L821 Other seborrheic keratosis: Secondary | ICD-10-CM | POA: Diagnosis not present

## 2022-01-07 DIAGNOSIS — Z85828 Personal history of other malignant neoplasm of skin: Secondary | ICD-10-CM | POA: Diagnosis not present

## 2022-01-07 DIAGNOSIS — D1801 Hemangioma of skin and subcutaneous tissue: Secondary | ICD-10-CM | POA: Diagnosis not present

## 2022-01-07 DIAGNOSIS — L814 Other melanin hyperpigmentation: Secondary | ICD-10-CM | POA: Diagnosis not present

## 2022-01-07 DIAGNOSIS — L738 Other specified follicular disorders: Secondary | ICD-10-CM | POA: Diagnosis not present

## 2022-03-23 DIAGNOSIS — R42 Dizziness and giddiness: Secondary | ICD-10-CM | POA: Diagnosis not present

## 2022-03-23 DIAGNOSIS — I10 Essential (primary) hypertension: Secondary | ICD-10-CM | POA: Diagnosis not present

## 2022-03-23 DIAGNOSIS — I480 Paroxysmal atrial fibrillation: Secondary | ICD-10-CM | POA: Diagnosis not present

## 2022-04-26 DIAGNOSIS — H401111 Primary open-angle glaucoma, right eye, mild stage: Secondary | ICD-10-CM | POA: Diagnosis not present

## 2022-05-05 ENCOUNTER — Encounter: Payer: Self-pay | Admitting: Urology

## 2022-05-05 ENCOUNTER — Ambulatory Visit: Payer: Medicare PPO | Admitting: Urology

## 2022-05-05 VITALS — BP 178/86 | HR 60

## 2022-05-05 DIAGNOSIS — N4889 Other specified disorders of penis: Secondary | ICD-10-CM | POA: Diagnosis not present

## 2022-05-05 DIAGNOSIS — N478 Other disorders of prepuce: Secondary | ICD-10-CM | POA: Diagnosis not present

## 2022-05-05 MED ORDER — CLOTRIMAZOLE-BETAMETHASONE 1-0.05 % EX CREA: 1.0000 "application " | TOPICAL_CREAM | Freq: Two times a day (BID) | CUTANEOUS | 3 refills | Status: DC

## 2022-05-05 NOTE — Progress Notes (Signed)
05/05/2022 11:14 AM   Tyler Sparks 13-Apr-1941 [id number]  Referring provider: Velna Hatchet, MD [place],  [place] [address]  Difficult retracting foreskin    HPI: Tyler Sparks is a 81yo here for evaluation of difficulty retracting foreskin. He was seen at [facility] several years ago for the same issue and was prescribed clotrimazole cream which improved his balanitis and difficulty retracting his foreskin. Overt the past 6 months he has noted progressive difficulty retracting his foreskin. He has intermittent cracking of the foreskin. He has irritation of the glans. He has increased difficulty urinating due to the narrow opening in his foreskin. No other complaints today   PMH: Past Medical History:  Diagnosis Date   Abdominal aortic aneurysm (AAA) 2017   Per patient he has AAA and has regular scans with Cardiology following   Atrial fibrillation ([place])    2008   Cancer [facility])    skin   Cholecystitis 10/2018   Gout    HTN (hypertension)    Hyperlipidemia    Nephrolithiasis    Peripheral neuropathy    Sleep apnea     Surgical History: Past Surgical History:  Procedure Laterality Date   ARM SKIN LESION BIOPSY / EXCISION Right 8 15   CHOLECYSTECTOMY N/A 11/01/2018   Procedure: LAPAROSCOPIC CHOLECYSTECTOMY;  Surgeon: Clovis Riley, MD;  Location: [place];  Service: General;  Laterality: N/A;   [place]  2008, 07/2014   TONSILECTOMY/ADENOIDECTOMY WITH MYRINGOTOMY  1950   WISDOM TOOTH EXTRACTION      Home Medications:  Allergies as of 05/05/2022       Reactions   Peanuts [peanut Oil] Anaphylaxis   Eggs Or Egg-derived Products Hives, Itching, Swelling        Medication List        Accurate as of May 05, 2022 11:14 AM. If you have any questions, ask your nurse or doctor.          allopurinol 300 MG tablet Commonly known as: ZYLOPRIM Take 300 mg by mouth at bedtime.   atenolol 25 MG tablet Commonly  known as: TENORMIN Take 25 mg by mouth daily.   atorvastatin 10 MG tablet Commonly known as: LIPITOR Take 10 mg by mouth at bedtime.   co-enzyme Q-10 50 MG capsule Take 50 mg by mouth daily.   colchicine 0.6 MG tablet Take 0.6 mg by mouth daily as needed (FOR GOUT FLARES).   diltiazem 180 MG 24 hr capsule Commonly known as: CARDIZEM CD Take 180 mg by mouth daily.   Eliquis 5 MG Tabs tablet Generic drug: apixaban TAKE ONE TABLET BY MOUTH TWICE A DAY   Fish Oil 1000 MG Caps Take 1,000 mg by mouth daily.   furosemide 40 MG tablet Commonly known as: LASIX Take 1 tablet (40 mg total) by mouth daily as needed.   Lagevrio 200 MG Caps capsule Generic drug: molnupiravir EUA Take by mouth.   latanoprost 0.005 % ophthalmic solution Commonly known as: XALATAN   loratadine 10 MG tablet Commonly known as: CLARITIN Take 10 mg by mouth daily.   Melatonin 3 MG Caps Take 3 mg by mouth at bedtime.   multivitamin tablet Take 1 tablet by mouth daily.   OSTEO BI-FLEX JOINT SHIELD PO Take 1 tablet by mouth 2 (two) times daily.   valsartan-hydrochlorothiazide 320-12.5 MG tablet Commonly known as: DIOVAN-HCT Take 1 tablet by mouth daily.   vitamin C 500 MG tablet Commonly known as: ASCORBIC  ACID Take 500 mg by mouth daily.   vitamin E 1000 UNIT capsule Take 1,000 Units by mouth daily.        Allergies:  Allergies  Allergen Reactions   Peanuts [Peanut Oil] Anaphylaxis   Eggs Or Egg-Derived Products Hives, Itching and Swelling    Family History: Family History  Problem Relation Age of Onset   Heart attack Father    Heart attack Mother    Colon cancer Neg Hx    Esophageal cancer Neg Hx    Rectal cancer Neg Hx    Stomach cancer Neg Hx     Social History:  reports that he quit smoking about 48 years ago. His smoking use included cigarettes. He has a 14.00 pack-year smoking history. He has never used smokeless tobacco. He reports current alcohol use of about 14.0  standard drinks per week. He reports that he does not use drugs.  ROS: All other review of systems were reviewed and are negative except what is noted above in HPI  Physical Exam: BP (!) 178/86   Pulse 60   Constitutional:  Alert and oriented, No acute distress. HEENT: [place] AT, moist mucus membranes.  Trachea midline, no masses. Cardiovascular: No clubbing, cyanosis, or edema. Respiratory: Normal respiratory effort, no increased work of breathing. GI: Abdomen is soft, nontender, nondistended, no abdominal masses GU: No CVA tenderness. Circumcised phallus. No masses/lesions on penis, testis, scrotum. Prostate 40g smooth no nodules no induration.  Lymph: No cervical or inguinal lymphadenopathy. Skin: No rashes, bruises or suspicious lesions. Neurologic: Grossly intact, no focal deficits, moving all 4 extremities. Psychiatric: Normal mood and affect.  Laboratory Data: Lab Results  Component Value Date   WBC 5.2 10/28/2019   HGB 13.8 10/28/2019   HCT 40.5 10/28/2019   MCV 95.5 10/28/2019   PLT 181 10/28/2019    Lab Results  Component Value Date   CREATININE 1.36 (H) 10/29/2019    No results found for: PSA  No results found for: TESTOSTERONE  Lab Results  Component Value Date   HGBA1C 5.7 (H) 10/28/2019    Urinalysis    Component Value Date/Time   COLORURINE YELLOW 10/27/2019 [place] 10/27/2019 1516   LABSPEC 1.016 10/27/2019 1516   [place] 7.0 10/27/2019 1516   GLUCOSEU NEGATIVE 10/27/2019 1516   HGBUR NEGATIVE 10/27/2019 1516   BILIRUBINUR NEGATIVE 10/27/2019 1516   KETONESUR 5 (A) 10/27/2019 1516   PROTEINUR NEGATIVE 10/27/2019 1516   NITRITE NEGATIVE 10/27/2019 1516   LEUKOCYTESUR NEGATIVE 10/27/2019 1516    Lab Results  Component Value Date   BACTERIA NONE SEEN 10/30/2018    Pertinent Imaging:  No results found for this or any previous visit.  No results found for this or any previous visit.  No results found for this or any  previous visit.  No results found for this or any previous visit.  No results found for this or any previous visit.  No results found for this or any previous visit.  No results found for this or any previous visit.  No results found for this or any previous visit.   Assessment & Plan:    1. Foreskin problem -Clotrimazole BID for 21 days - Urinalysis, Routine w reflex microscopic  2. Penile pain Clotrimazole BID for 21 days - Urinalysis, Routine w reflex microscopic   No follow-ups on file.  Nicolette Bang, MD  [facility] [place]

## 2022-05-05 NOTE — Patient Instructions (Signed)
Balanitis ? ?Balanitis is swelling and irritation of the head of the penis (glans penis). Balanitis occurs most often among males who have not had their foreskin removed (uncircumcised). In uncircumcised males, the condition may also cause inflammation of the skin around the foreskin. ?Balanitis sometimes causes scarring of the penis or foreskin, which can require surgery. This condition may develop because of an infection or another medical condition. Untreated balanitis can increase the risk of penile cancer. ?What are the causes? ?Common causes of this condition include: ?Irritation and lack of airflow due to fluid (smegma) that can build up on the glans penis. ?Poor personal hygiene, especially in uncircumcised males. Not cleaning the glans penis and foreskin well can result in a buildup of bacteria, viruses, and yeast, which can lead to infection and inflammation. ?Other causes include: ?Chemical irritation from products such as soaps or shower gels, especially those that have fragrance. Chemical irritation can also be caused by condoms, personal lubricants, petroleum jelly, spermicides, fabric softeners, or laundry detergents. ?Skin conditions, such as eczema, dermatitis, and psoriasis. ?Allergies to medicines, such as tetracycline and sulfa drugs. ?What increases the risk? ?The following factors may make you more likely to develop this condition: ?Being an uncircumcised male. ?Having diabetes. ?Having other medical conditions, including liver cirrhosis, congestive heart failure, or kidney disease. ?Having infections, such as candidiasis, HPV (human papillomavirus), herpes simplex, gonorrhea, or syphilis. ?Having a tight foreskin that is difficult to pull back (retract) past the glans penis. ?Being severely obese. ?History of reactive arthritis. ?What are the signs or symptoms? ?Symptoms of this condition include: ?Discharge from under the foreskin, and pain or difficulty retracting the foreskin. ?A bad smell  or itchiness on the penis. ?Tenderness, redness, and swelling of the glans penis. ?A rash or sores on the glans penis or foreskin. ?Inability to get an erection due to pain. ?Trouble urinating. ?Scarring of the penis or foreskin, in some cases. ?How is this diagnosed? ?This condition may be diagnosed based on a physical exam and tests of a swab of discharge to check for bacterial or fungal infection. ?You may also have blood tests to check for: ?Viruses that can cause balanitis. ?A high blood sugar (glucose) level. This could be a sign of diabetes, which can increase the risk of balanitis. ?How is this treated? ?Treatment for this condition depends on the cause. Treatment may include: ?Improving personal hygiene. Your health care provider may recommend sitting in a bath of warm water that is deep enough to cover your hips and buttocks (sitz bath). ?Medicines such as: ?Creams or ointments to reduce swelling (steroids) or to treat an infection. ?Antibiotic medicine. ?Antifungal medicine. ?Having surgery to remove or cut the foreskin (circumcision). This may be done if you have scarring on the foreskin that makes it difficult to retract. ?Controlling other medical problems that may be causing your condition or making it worse. ?Follow these instructions at home: ?Medicines ?Take over-the-counter and prescription medicines only as told by your health care provider. ?If you were prescribed an antibiotic medicine, use it as told by your health care provider. Do not stop using the antibiotic even if you start to feel better. ?General instructions ?Do not have sex until the condition clears up, or until your health care provider approves. ?Keep your penis clean and dry. Take sitz baths as recommended by your health care provider. ?Avoid products that irritate your skin or make symptoms worse, such as soaps and shower gels that have fragrance. ?Keep all follow-up visits. This is   important. ?Contact a health care provider  if: ?Your symptoms get worse or do not improve with home care. ?You develop chills or a fever. ?You have trouble urinating. ?You cannot retract your foreskin. ?Get help right away if: ?You develop severe pain. ?You are unable to urinate. ?Summary ?Balanitis is swelling and irritation of the head of the penis (glans penis). This condition is most common among uncircumcised males. ?Balanitis causes pain, redness, and swelling of the glans penis. ?Good personal hygiene is important. ?Treatment may include improving personal hygiene and applying creams or ointments. ?Contact a health care provider if your symptoms get worse or do not improve with home care. ?This information is not intended to replace advice given to you by your health care provider. Make sure you discuss any questions you have with your health care provider. ?Document Revised: 05/20/2021 Document Reviewed: 05/20/2021 ?Elsevier Patient Education ? 2023 Elsevier Inc. ? ?

## 2022-06-08 DIAGNOSIS — H401111 Primary open-angle glaucoma, right eye, mild stage: Secondary | ICD-10-CM | POA: Diagnosis not present

## 2022-06-14 ENCOUNTER — Encounter: Payer: Self-pay | Admitting: Urology

## 2022-06-14 ENCOUNTER — Ambulatory Visit: Payer: Medicare PPO | Admitting: Urology

## 2022-06-14 VITALS — BP 177/93 | HR 76

## 2022-06-14 DIAGNOSIS — N478 Other disorders of prepuce: Secondary | ICD-10-CM

## 2022-06-14 LAB — URINALYSIS, ROUTINE W REFLEX MICROSCOPIC
Bilirubin, UA: NEGATIVE
Glucose, UA: NEGATIVE
Ketones, UA: NEGATIVE
Leukocytes,UA: NEGATIVE
Nitrite, UA: NEGATIVE
RBC, UA: NEGATIVE
Specific Gravity, UA: 1.02 (ref 1.005–1.030)
Urobilinogen, Ur: 0.2 mg/dL (ref 0.2–1.0)
pH, UA: 5 (ref 5.0–7.5)

## 2022-06-14 MED ORDER — FLUCONAZOLE 100 MG PO TABS: 100.0000 mg | ORAL_TABLET | Freq: Every day | ORAL | 0 refills | Status: DC

## 2022-06-14 MED ORDER — NYSTATIN-TRIAMCINOLONE 100000-0.1 UNIT/GM-% EX OINT: 1.0000 | TOPICAL_OINTMENT | Freq: Two times a day (BID) | CUTANEOUS | 0 refills | Status: DC

## 2022-06-14 NOTE — Progress Notes (Signed)
06/14/2022 9:06 AM   Tyler Sparks 1941/01/10 [id number]  Referring provider: Velna Hatchet, MD [place],  [place] [address]  Foreskin irritation   HPI: Tyler Sparks is a 81yo here for followup for balanitis. He has not noticed improvement in his foreskin irritation with clotrimazole. He has difficulty retracting his foreskin. No difficulty urinating.    PMH: Past Medical History:  Diagnosis Date   Abdominal aortic aneurysm (AAA) ([place]) 2017   Per patient he has AAA and has regular scans with Cardiology following   Atrial fibrillation ([place])    2008   Cancer [facility])    skin   Cholecystitis 10/2018   Gout    HTN (hypertension)    Hyperlipidemia    Nephrolithiasis    Peripheral neuropathy    Sleep apnea     Surgical History: Past Surgical History:  Procedure Laterality Date   ARM SKIN LESION BIOPSY / EXCISION Right 8 15   CHOLECYSTECTOMY N/A 11/01/2018   Procedure: LAPAROSCOPIC CHOLECYSTECTOMY;  Surgeon: Clovis Riley, MD;  Location: [place];  Service: General;  Laterality: N/A;   [place]  2008, 07/2014   TONSILECTOMY/ADENOIDECTOMY WITH MYRINGOTOMY  1950   WISDOM TOOTH EXTRACTION      Home Medications:  Allergies as of 06/14/2022       Reactions   Peanuts [peanut Oil] Anaphylaxis   Eggs Or Egg-derived Products Hives, Itching, Swelling        Medication List        Accurate as of June 14, 2022  9:06 AM. If you have any questions, ask your nurse or doctor.          allopurinol 300 MG tablet Commonly known as: ZYLOPRIM Take 300 mg by mouth at bedtime.   atenolol 25 MG tablet Commonly known as: TENORMIN Take 25 mg by mouth daily.   atorvastatin 10 MG tablet Commonly known as: LIPITOR Take 10 mg by mouth at bedtime.   clotrimazole-betamethasone cream Commonly known as: Lotrisone Apply 1 application. topically 2 (two) times daily.   co-enzyme Q-10 50 MG capsule Take 50 mg by mouth daily.    colchicine 0.6 MG tablet Take 0.6 mg by mouth daily as needed (FOR GOUT FLARES).   diltiazem 180 MG 24 hr capsule Commonly known as: CARDIZEM CD Take 180 mg by mouth daily.   Eliquis 5 MG Tabs tablet Generic drug: apixaban TAKE ONE TABLET BY MOUTH TWICE A DAY   Fish Oil 1000 MG Caps Take 1,000 mg by mouth daily.   fluconazole 100 MG tablet Commonly known as: DIFLUCAN Take 1 tablet (100 mg total) by mouth daily. X 7 days   furosemide 40 MG tablet Commonly known as: LASIX Take 1 tablet (40 mg total) by mouth daily as needed.   Lagevrio 200 MG Caps capsule Generic drug: molnupiravir EUA Take by mouth.   latanoprost 0.005 % ophthalmic solution Commonly known as: XALATAN   loratadine 10 MG tablet Commonly known as: CLARITIN Take 10 mg by mouth daily.   Melatonin 3 MG Caps Take 3 mg by mouth at bedtime.   multivitamin tablet Take 1 tablet by mouth daily.   nystatin-triamcinolone ointment Commonly known as: MYCOLOG Apply 1 Application topically 2 (two) times daily.   OSTEO BI-FLEX JOINT SHIELD PO Take 1 tablet by mouth 2 (two) times daily.   valsartan-hydrochlorothiazide 320-12.5 MG tablet Commonly known as: DIOVAN-HCT Take 1 tablet by mouth daily.   vitamin C 500 MG tablet Commonly  known as: ASCORBIC ACID Take 500 mg by mouth daily.   vitamin E 1000 UNIT capsule Take 1,000 Units by mouth daily.        Allergies:  Allergies  Allergen Reactions   Peanuts [Peanut Oil] Anaphylaxis   Eggs Or Egg-Derived Products Hives, Itching and Swelling    Family History: Family History  Problem Relation Age of Onset   Heart attack Father    Heart attack Mother    Colon cancer Neg Hx    Esophageal cancer Neg Hx    Rectal cancer Neg Hx    Stomach cancer Neg Hx     Social History:  reports that he quit smoking about 48 years ago. His smoking use included cigarettes. He has a 14.00 pack-year smoking history. He has never used smokeless tobacco. He reports current  alcohol use of about 14.0 standard drinks of alcohol per week. He reports that he does not use drugs.  ROS: All other review of systems were reviewed and are negative except what is noted above in HPI  Physical Exam: BP (!) 177/93   Pulse 76   Constitutional:  Alert and oriented, No acute distress. HEENT: [place] AT, moist mucus membranes.  Trachea midline, no masses. Cardiovascular: No clubbing, cyanosis, or edema. Respiratory: Normal respiratory effort, no increased work of breathing. GI: Abdomen is soft, nontender, nondistended, no abdominal masses GU: No CVA tenderness. Uncircumcised phallus. Balanitis present. No masses/lesions on penis, testis, scrotum. Lymph: No cervical or inguinal lymphadenopathy. Skin: No rashes, bruises or suspicious lesions. Neurologic: Grossly intact, no focal deficits, moving all 4 extremities. Psychiatric: Normal mood and affect.  Laboratory Data: Lab Results  Component Value Date   WBC 5.2 10/28/2019   HGB 13.8 10/28/2019   HCT 40.5 10/28/2019   MCV 95.5 10/28/2019   PLT 181 10/28/2019    Lab Results  Component Value Date   CREATININE 1.36 (H) 10/29/2019    No results found for: "PSA"  No results found for: "TESTOSTERONE"  Lab Results  Component Value Date   HGBA1C 5.7 (H) 10/28/2019    Urinalysis    Component Value Date/Time   COLORURINE YELLOW 10/27/2019 [place] 10/27/2019 1516   LABSPEC 1.016 10/27/2019 1516   [place] 7.0 10/27/2019 1516   GLUCOSEU NEGATIVE 10/27/2019 1516   HGBUR NEGATIVE 10/27/2019 1516   BILIRUBINUR NEGATIVE 10/27/2019 1516   KETONESUR 5 (A) 10/27/2019 1516   PROTEINUR NEGATIVE 10/27/2019 1516   NITRITE NEGATIVE 10/27/2019 1516   LEUKOCYTESUR NEGATIVE 10/27/2019 1516    Lab Results  Component Value Date   BACTERIA NONE SEEN 10/30/2018    Pertinent Imaging:  No results found for this or any previous visit.  No results found for this or any previous visit.  No results found for this  or any previous visit.  No results found for this or any previous visit.  No results found for this or any previous visit.  No results found for this or any previous visit.  No results found for this or any previous visit.  No results found for this or any previous visit.   Assessment & Plan:    1. Foreskin problem -diflucan '100mg'$  daily for 7 days -nystatin ointment BID for 4 weeks - Urinalysis, Routine w reflex microscopic   No follow-ups on file.  Nicolette Bang, MD  [facility] [place]

## 2022-06-16 ENCOUNTER — Ambulatory Visit (INDEPENDENT_AMBULATORY_CARE_PROVIDER_SITE_OTHER)
Admission: RE | Admit: 2022-06-16 | Discharge: 2022-06-16 | Disposition: A | Discharge status: Home / Self Care | Payer: Medicare PPO | Source: Ambulatory Visit | Attending: Cardiovascular Disease | Admitting: Cardiovascular Disease

## 2022-06-16 DIAGNOSIS — I7121 Aneurysm of the ascending aorta, without rupture: Secondary | ICD-10-CM

## 2022-06-16 MED ORDER — IOHEXOL 350 MG/ML SOLN
100.0000 mL | Freq: Once | INTRAVENOUS | Status: AC | PRN
Start: 2022-06-16 — End: 2022-06-16
  Administered 2022-06-16: 100 mL via INTRATHECAL

## 2022-06-17 ENCOUNTER — Telehealth: Payer: Self-pay | Admitting: Cardiovascular Disease

## 2022-06-17 NOTE — Telephone Encounter (Signed)
Pt returning nurses call regarding results. Pt states unless there is an issue with results he does not need a callback. Please advise

## 2022-06-23 DIAGNOSIS — D225 Melanocytic nevi of trunk: Secondary | ICD-10-CM | POA: Diagnosis not present

## 2022-06-23 DIAGNOSIS — L814 Other melanin hyperpigmentation: Secondary | ICD-10-CM | POA: Diagnosis not present

## 2022-06-23 DIAGNOSIS — D1801 Hemangioma of skin and subcutaneous tissue: Secondary | ICD-10-CM | POA: Diagnosis not present

## 2022-06-23 DIAGNOSIS — L821 Other seborrheic keratosis: Secondary | ICD-10-CM | POA: Diagnosis not present

## 2022-06-23 DIAGNOSIS — L570 Actinic keratosis: Secondary | ICD-10-CM | POA: Diagnosis not present

## 2022-06-23 DIAGNOSIS — Z85828 Personal history of other malignant neoplasm of skin: Secondary | ICD-10-CM | POA: Diagnosis not present

## 2022-06-25 ENCOUNTER — Other Ambulatory Visit: Payer: Medicare PPO

## 2022-07-20 ENCOUNTER — Ambulatory Visit: Payer: Medicare PPO | Admitting: Urology

## 2022-07-20 VITALS — BP 151/85 | HR 76

## 2022-07-20 DIAGNOSIS — R3915 Urgency of urination: Secondary | ICD-10-CM

## 2022-07-20 DIAGNOSIS — N3281 Overactive bladder: Secondary | ICD-10-CM

## 2022-07-20 DIAGNOSIS — N478 Other disorders of prepuce: Secondary | ICD-10-CM | POA: Diagnosis not present

## 2022-07-20 DIAGNOSIS — N481 Balanitis: Secondary | ICD-10-CM

## 2022-07-20 LAB — MICROSCOPIC EXAMINATION
Bacteria, UA: NONE SEEN
Epithelial Cells (non renal): NONE SEEN /hpf (ref 0–10)
Renal Epithel, UA: NONE SEEN /hpf

## 2022-07-20 LAB — URINALYSIS, ROUTINE W REFLEX MICROSCOPIC
Bilirubin, UA: NEGATIVE
Glucose, UA: NEGATIVE
Ketones, UA: NEGATIVE
Nitrite, UA: NEGATIVE
Protein,UA: NEGATIVE
Specific Gravity, UA: 1.02 (ref 1.005–1.030)
Urobilinogen, Ur: 1 mg/dL (ref 0.2–1.0)
pH, UA: 5.5 (ref 5.0–7.5)

## 2022-07-20 MED ORDER — MIRABEGRON ER 25 MG PO TB24: 25.0000 mg | ORAL_TABLET | Freq: Every day | ORAL | 0 refills | Status: DC

## 2022-07-20 NOTE — Progress Notes (Signed)
07/20/2022 8:58 AM   Tyler Sparks 04-Dec-1941 [id number]  Referring provider: Velna Hatchet, [place],  [place] [address]  Followup penile pain and urinary urgency   HPI: Tyler Sparks is a 81yo here for followup for balanitis and urinary urgency. He has tried multiple creams and oral antifungals which have failed to improve his foreskin lesions and irration. He has urinary urgency and daily urge incontinence which bothers him. His urine stream is strong. No straining to urinate. No feeling of incomplete emptying   PMH: Past Medical History:  Diagnosis Date   Abdominal aortic aneurysm (AAA) ([place]) 2017   Per patient he has AAA and has regular scans with Cardiology following   Atrial fibrillation ([place])    2008   Cancer [facility])    skin   Cholecystitis 10/2018   Gout    HTN (hypertension)    Hyperlipidemia    Nephrolithiasis    Peripheral neuropathy    Sleep apnea     Surgical History: Past Surgical History:  Procedure Laterality Date   ARM SKIN LESION BIOPSY / EXCISION Right 8 15   CHOLECYSTECTOMY N/A 11/01/2018   Procedure: LAPAROSCOPIC CHOLECYSTECTOMY;  Surgeon: Clovis Riley, MD;  Location: [place];  Service: General;  Laterality: N/A;   [place]  2008, 07/2014   TONSILECTOMY/ADENOIDECTOMY WITH MYRINGOTOMY  1950   WISDOM TOOTH EXTRACTION      Home Medications:  Allergies as of 07/20/2022       Reactions   Peanuts [peanut Oil] Anaphylaxis   Eggs Or Egg-derived Products Hives, Itching, Swelling        Medication List        Accurate as of July 20, 2022  8:58 AM. If you have any questions, ask your nurse or doctor.          allopurinol 300 MG tablet Commonly known as: ZYLOPRIM Take 300 mg by mouth at bedtime.   ascorbic acid 500 MG tablet Commonly known as: VITAMIN C Take 500 mg by mouth daily.   atenolol 25 MG tablet Commonly known as: TENORMIN Take 25 mg by mouth daily.   atorvastatin 10 MG  tablet Commonly known as: LIPITOR Take 10 mg by mouth at bedtime.   clotrimazole-betamethasone cream Commonly known as: Lotrisone Apply 1 application. topically 2 (two) times daily.   co-enzyme Q-10 50 MG capsule Take 50 mg by mouth daily.   colchicine 0.6 MG tablet Take 0.6 mg by mouth daily as needed (FOR GOUT FLARES).   diltiazem 180 MG 24 hr capsule Commonly known as: CARDIZEM CD Take 180 mg by mouth daily.   Eliquis 5 MG Tabs tablet Generic drug: apixaban TAKE ONE TABLET BY MOUTH TWICE A DAY   Fish Oil 1000 MG Caps Take 1,000 mg by mouth daily.   fluconazole 100 MG tablet Commonly known as: DIFLUCAN Take 1 tablet (100 mg total) by mouth daily. X 7 days   furosemide 40 MG tablet Commonly known as: LASIX Take 1 tablet (40 mg total) by mouth daily as needed.   Lagevrio 200 MG Caps capsule Generic drug: molnupiravir EUA Take by mouth.   latanoprost 0.005 % ophthalmic solution Commonly known as: XALATAN   loratadine 10 MG tablet Commonly known as: CLARITIN Take 10 mg by mouth daily.   Melatonin 3 MG Caps Take 3 mg by mouth at bedtime.   multivitamin tablet Take 1 tablet by mouth daily.   nystatin-triamcinolone ointment Commonly known as: [employer]  1 Application topically 2 (two) times daily.   OSTEO BI-FLEX JOINT SHIELD PO Take 1 tablet by mouth 2 (two) times daily.   valsartan-hydrochlorothiazide 320-12.5 MG tablet Commonly known as: DIOVAN-HCT Take 1 tablet by mouth daily.   vitamin E 1000 UNIT capsule Take 1,000 Units by mouth daily.        Allergies:  Allergies  Allergen Reactions   Peanuts [Peanut Oil] Anaphylaxis   Eggs Or Egg-Derived Products Hives, Itching and Swelling    Family History: Family History  Problem Relation Age of Onset   Heart attack Father    Heart attack Mother    Colon cancer Neg Hx    Esophageal cancer Neg Hx    Rectal cancer Neg Hx    Stomach cancer Neg Hx     Social History:  reports that he quit  smoking about 48 years ago. His smoking use included cigarettes. He has a 14.00 pack-year smoking history. He has never used smokeless tobacco. He reports current alcohol use of about 14.0 standard drinks of alcohol per week. He reports that he does not use drugs.  ROS: All other review of systems were reviewed and are negative except what is noted above in HPI  Physical Exam: BP (!) 151/85   Pulse 76   Constitutional:  Alert and oriented, No acute distress. HEENT: [place] AT, moist mucus membranes.  Trachea midline, no masses. Cardiovascular: No clubbing, cyanosis, or edema. Respiratory: Normal respiratory effort, no increased work of breathing. GI: Abdomen is soft, nontender, nondistended, no abdominal masses GU: No CVA tenderness. Uncircumcised  phallus. Balanitis with ulceration of the foreskin. No masses/lesions on penis, testis, scrotum.  Lymph: No cervical or inguinal lymphadenopathy. Skin: No rashes, bruises or suspicious lesions. Neurologic: Grossly intact, no focal deficits, moving all 4 extremities. Psychiatric: Normal mood and affect.  Laboratory Data: Lab Results  Component Value Date   WBC 5.2 10/28/2019   HGB 13.8 10/28/2019   HCT 40.5 10/28/2019   MCV 95.5 10/28/2019   PLT 181 10/28/2019    Lab Results  Component Value Date   CREATININE 1.36 (H) 10/29/2019    No results found for: "PSA"  No results found for: "TESTOSTERONE"  Lab Results  Component Value Date   HGBA1C 5.7 (H) 10/28/2019    Urinalysis    Component Value Date/Time   COLORURINE YELLOW 10/27/2019 1516   APPEARANCEUR Clear 06/14/2022 1002   LABSPEC 1.016 10/27/2019 1516   PHURINE 7.0 10/27/2019 1516   GLUCOSEU Negative 06/14/2022 1002   HGBUR NEGATIVE 10/27/2019 1516   BILIRUBINUR Negative 06/14/2022 1002   KETONESUR 5 (A) 10/27/2019 1516   PROTEINUR Trace (A) 06/14/2022 1002   PROTEINUR NEGATIVE 10/27/2019 1516   NITRITE Negative 06/14/2022 1002   NITRITE NEGATIVE 10/27/2019 1516    LEUKOCYTESUR Negative 06/14/2022 1002   LEUKOCYTESUR NEGATIVE 10/27/2019 1516    Lab Results  Component Value Date   LABMICR Comment 06/14/2022   BACTERIA NONE SEEN 10/30/2018    Pertinent Imaging:  No results found for this or any previous visit.  No results found for this or any previous visit.  No results found for this or any previous visit.  No results found for this or any previous visit.  No results found for this or any previous visit.  No results found for this or any previous visit.  No results found for this or any previous visit.  No results found for this or any previous visit.   Assessment & Plan:    1. Foreskin  problem We discussed continued medical therapy versus circumcision. After discussing the options the patient elects for circumcision. - Urinalysis, Routine w reflex microscopic  2. Urinary urgency. -we will trial mirabegron '25mg'$  daily  No follow-ups on file.  Nicolette Bang, MD  [facility] [place]

## 2022-07-22 ENCOUNTER — Telehealth: Payer: Self-pay

## 2022-07-22 NOTE — Telephone Encounter (Signed)
Received surgical posting sheet Called patient, left message to return call to discuss surgery dates.

## 2022-07-25 ENCOUNTER — Encounter: Payer: Self-pay | Admitting: Urology

## 2022-07-25 NOTE — Patient Instructions (Signed)
Circumcision, Adult  Circumcision is a surgery to remove or cut the fold of skin that covers the head of the penis (foreskin). When the foreskin is cut but not removed, the procedure is called a dorsal incision or dorsal circumcision. A dorsal circumcision leaves the entire foreskin but makes the end of the foreskin looser so it can be pulled back over the head of the penis. Tell a health care provider about: Any allergies you have. All medicines you are taking, including vitamins, herbs, eye drops, creams, and over-the-counter medicines. Any problems you or family members have had with anesthetic medicines. Any bleeding problems you have. Any surgeries you have had. Any medical conditions you have, including the common cold or another infection. What are the risks? Generally, this is a safe procedure. However, problems may occur, including: Bleeding. Pain. Infection. Allergic reactions to medicines. Opening of the surgical wound. This can occur from an unwanted erection after surgery. What happens before the procedure? When to stop eating and drinking Follow instructions from your health care provider about what you may eat and drink before your procedure. These may include: 8 hours before your procedure Stop eating most foods. Do not eat meat, fried foods, or fatty foods. Eat only light foods, such as toast or crackers. All liquids are okay except energy drinks and alcohol. 6 hours before your procedure Stop eating. Drink only clear liquids, such as water, clear fruit juice, black coffee, plain tea, and sports drinks. Do not drink energy drinks or alcohol. 2 hours before your procedure Stop drinking all liquids. You may be allowed to take medicines with small sips of water. If you do not follow your health care provider's instructions, your procedure may be delayed or canceled. Medicines Ask your health care provider about: Changing or stopping your regular medicines. This is  especially important if you are taking diabetes medicines or blood thinners. Taking medicines such as aspirin and ibuprofen. These medicines can thin your blood. Do not take these medicines unless your health care provider tells you to take them. Taking over-the-counter medicines, vitamins, herbs, and supplements. General instructions  Do not use any products that contain nicotine or tobacco for at least 4 weeks before the procedure. These products include cigarettes, chewing tobacco, and vaping devices, such as e-cigarettes. If you need help quitting, ask your health care provider. If you will be going home right after the procedure, plan to have a responsible adult: Take you home from the hospital or clinic. You will not be allowed to drive. Care for you for the time you are told. Ask your health care provider: How your surgery site will be marked. What steps will be taken to help prevent infection. These may include: Washing skin with a germ-killing soap. Taking antibiotic medicine. What happens during the procedure? An IV may be inserted into one of your veins. You will be given one or more of the following: A medicine to help you relax (sedative). A medicine to numb the area (local anesthetic). This will be injected with a needle into the skin of your penis. An incision will be made to remove or cut the foreskin. Absorbable stitches (sutures) may be used to close the incision. A bandage (dressing) will be applied to the incision site. The IV will be removed. The procedure may vary among health care providers and hospitals. What happens after the procedure? Your blood pressure, heart rate, breathing rate, and blood oxygen level will be monitored until you leave the hospital or clinic.   Do not get out of bed until your health care provider approves. If you were given a sedative during the procedure, it can affect you for several hours. Do not drive or operate machinery until your health  care provider says that it is safe. Summary Circumcision is a surgery to remove or cut the fold of skin that covers the head of the penis. Follow instructions from your health care provider about eating and drinking before your procedure. If you will be going home right after the procedure, plan to have a responsible adult take you home and care for you for the time you are told. This information is not intended to replace advice given to you by your health care provider. Make sure you discuss any questions you have with your health care provider. Document Revised: 12/07/2021 Document Reviewed: 12/07/2021 Elsevier Patient Education  2023 Elsevier Inc.  

## 2022-07-27 DIAGNOSIS — M109 Gout, unspecified: Secondary | ICD-10-CM | POA: Diagnosis not present

## 2022-07-27 DIAGNOSIS — E785 Hyperlipidemia, unspecified: Secondary | ICD-10-CM | POA: Diagnosis not present

## 2022-07-27 DIAGNOSIS — Z125 Encounter for screening for malignant neoplasm of prostate: Secondary | ICD-10-CM | POA: Diagnosis not present

## 2022-07-27 DIAGNOSIS — R7989 Other specified abnormal findings of blood chemistry: Secondary | ICD-10-CM | POA: Diagnosis not present

## 2022-07-27 DIAGNOSIS — I10 Essential (primary) hypertension: Secondary | ICD-10-CM | POA: Diagnosis not present

## 2022-08-03 DIAGNOSIS — Z1331 Encounter for screening for depression: Secondary | ICD-10-CM | POA: Diagnosis not present

## 2022-08-03 DIAGNOSIS — I10 Essential (primary) hypertension: Secondary | ICD-10-CM | POA: Diagnosis not present

## 2022-08-03 DIAGNOSIS — R82998 Other abnormal findings in urine: Secondary | ICD-10-CM | POA: Diagnosis not present

## 2022-08-03 DIAGNOSIS — Z0000 Encounter for general adult medical examination without abnormal findings: Secondary | ICD-10-CM | POA: Diagnosis not present

## 2022-08-03 DIAGNOSIS — Z1339 Encounter for screening examination for other mental health and behavioral disorders: Secondary | ICD-10-CM | POA: Diagnosis not present

## 2022-08-03 DIAGNOSIS — I712 Thoracic aortic aneurysm, without rupture, unspecified: Secondary | ICD-10-CM | POA: Diagnosis not present

## 2022-08-03 DIAGNOSIS — N481 Balanitis: Secondary | ICD-10-CM | POA: Diagnosis not present

## 2022-08-03 DIAGNOSIS — I872 Venous insufficiency (chronic) (peripheral): Secondary | ICD-10-CM | POA: Diagnosis not present

## 2022-08-03 DIAGNOSIS — I2510 Atherosclerotic heart disease of native coronary artery without angina pectoris: Secondary | ICD-10-CM | POA: Diagnosis not present

## 2022-08-03 DIAGNOSIS — I480 Paroxysmal atrial fibrillation: Secondary | ICD-10-CM | POA: Diagnosis not present

## 2022-08-03 DIAGNOSIS — G629 Polyneuropathy, unspecified: Secondary | ICD-10-CM | POA: Diagnosis not present

## 2022-08-05 NOTE — Telephone Encounter (Signed)
I spoke with Mr., Tyler Sparks. We have discussed possible surgery dates and 09/02/2022 was agreed upon by all parties. Patient given information about surgery date, what to expect pre-operatively and post operatively.    We discussed that a pre-op nurse will be calling to set up the pre-op visit that will take place prior to surgery. Informed patient that our office will communicate any additional care to be provided after surgery.    Patients questions or concerns were discussed during our call. Advised to call our office should there be any additional information, questions or concerns that arise. Patient verbalized understanding.

## 2022-08-27 ENCOUNTER — Encounter: Payer: Self-pay | Admitting: Urology

## 2022-08-27 ENCOUNTER — Ambulatory Visit: Payer: Medicare PPO | Admitting: Urology

## 2022-08-27 VITALS — BP 176/86 | HR 75

## 2022-08-27 DIAGNOSIS — N3281 Overactive bladder: Secondary | ICD-10-CM | POA: Diagnosis not present

## 2022-08-27 DIAGNOSIS — N478 Other disorders of prepuce: Secondary | ICD-10-CM

## 2022-08-27 MED ORDER — CLOTRIMAZOLE-BETAMETHASONE 1-0.05 % EX CREA: 1.0000 | TOPICAL_CREAM | Freq: Two times a day (BID) | CUTANEOUS | 3 refills | Status: DC

## 2022-08-27 NOTE — Progress Notes (Signed)
08/27/2022 12:12 PM   Tyler Sparks [birth year] [id number]  Referring provider: Velna Hatchet, MD [place],  [place] [address]  Followup balanitis and OAB   HPI: Tyler Sparks is a 81yo here for followup for balanitis and OAB. Since apply the nystatin cream his balanitis has significantly improved. He denies any penile pain. He notes significant improvement in his urinary urgency and urinary frequency since starting mirabegron. No straining to urinate. NO other complaints today   PMH: Past Medical History:  Diagnosis Date   Abdominal aortic aneurysm (AAA) ([place]) 2017   Per patient he has AAA and has regular scans with Cardiology following   Atrial fibrillation ([place])    2008   Cancer [facility])    skin   Cholecystitis 10/2018   Gout    HTN (hypertension)    Hyperlipidemia    Nephrolithiasis    Peripheral neuropathy    Sleep apnea     Surgical History: Past Surgical History:  Procedure Laterality Date   ARM SKIN LESION BIOPSY / EXCISION Right 8 15   CHOLECYSTECTOMY N/A 11/01/2018   Procedure: LAPAROSCOPIC CHOLECYSTECTOMY;  Surgeon: Clovis Riley, MD;  Location: [place];  Service: General;  Laterality: N/A;   [place]  2008, 07/2014   TONSILECTOMY/ADENOIDECTOMY WITH MYRINGOTOMY  1950   WISDOM TOOTH EXTRACTION      Home Medications:  Allergies as of 08/27/2022       Reactions   Peanuts [peanut Oil] Anaphylaxis   Eggs Or Egg-derived Products Hives, Itching, Swelling        Medication List        Accurate as of August 27, 2022 12:12 PM. If you have any questions, ask your nurse or doctor.          allopurinol 300 MG tablet Commonly known as: ZYLOPRIM Take 300 mg by mouth at bedtime.   ascorbic acid 500 MG tablet Commonly known as: VITAMIN C Take 500 mg by mouth daily.   atenolol 25 MG tablet Commonly known as: TENORMIN Take 25 mg by mouth daily.   atorvastatin 10 MG tablet Commonly known as:  LIPITOR Take 10 mg by mouth at bedtime.   clotrimazole-betamethasone cream Commonly known as: Lotrisone Apply 1 application. topically 2 (two) times daily.   co-enzyme Q-10 50 MG capsule Take 50 mg by mouth daily.   colchicine 0.6 MG tablet Take 0.6 mg by mouth daily as needed (FOR GOUT FLARES).   diltiazem 180 MG 24 hr capsule Commonly known as: CARDIZEM CD Take 180 mg by mouth daily.   Eliquis 5 MG Tabs tablet Generic drug: apixaban TAKE ONE TABLET BY MOUTH TWICE A DAY   Fish Oil 1000 MG Caps Take 1,000 mg by mouth daily.   fluconazole 100 MG tablet Commonly known as: DIFLUCAN Take 1 tablet (100 mg total) by mouth daily. X 7 days   furosemide 40 MG tablet Commonly known as: LASIX Take 1 tablet (40 mg total) by mouth daily as needed.   Lagevrio 200 MG Caps capsule Generic drug: molnupiravir EUA Take by mouth.   latanoprost 0.005 % ophthalmic solution Commonly known as: XALATAN   loratadine 10 MG tablet Commonly known as: CLARITIN Take 10 mg by mouth daily.   Melatonin 3 MG Caps Take 3 mg by mouth at bedtime.   mirabegron ER 25 MG Tb24 tablet Commonly known as: MYRBETRIQ Take 1 tablet (25 mg total) by mouth daily.   multivitamin tablet Take 1 tablet by  mouth daily.   nystatin-triamcinolone ointment Commonly known as: MYCOLOG Apply 1 Application topically 2 (two) times daily.   OSTEO BI-FLEX JOINT SHIELD PO Take 1 tablet by mouth 2 (two) times daily.   valsartan-hydrochlorothiazide 320-12.5 MG tablet Commonly known as: DIOVAN-HCT Take 1 tablet by mouth daily.   vitamin E 1000 UNIT capsule Take 1,000 Units by mouth daily.        Allergies:  Allergies  Allergen Reactions   Peanuts [Peanut Oil] Anaphylaxis   Eggs Or Egg-Derived Products Hives, Itching and Swelling    Family History: Family History  Problem Relation Age of Onset   Heart attack Father    Heart attack Mother    Colon cancer Neg Hx    Esophageal cancer Neg Hx    Rectal  cancer Neg Hx    Stomach cancer Neg Hx     Social History:  reports that he quit smoking about 48 years ago. His smoking use included cigarettes. He has a 14.00 pack-year smoking history. He has never used smokeless tobacco. He reports current alcohol use of about 14.0 standard drinks of alcohol per week. He reports that he does not use drugs.  ROS: All other review of systems were reviewed and are negative except what is noted above in HPI  Physical Exam: BP (!) 176/86   Pulse 75   Constitutional:  Alert and oriented, No acute distress. HEENT: [place] AT, moist mucus membranes.  Trachea midline, no masses. Cardiovascular: No clubbing, cyanosis, or edema. Respiratory: Normal respiratory effort, no increased work of breathing. GI: Abdomen is soft, nontender, nondistended, no abdominal masses GU: No CVA tenderness. Circumcised phallus. No masses/lesions on penis, testis, scrotum. Balanitis improved. Lymph: No cervical or inguinal lymphadenopathy. Skin: No rashes, bruises or suspicious lesions. Neurologic: Grossly intact, no focal deficits, moving all 4 extremities. Psychiatric: Normal mood and affect.  Laboratory Data: Lab Results  Component Value Date   WBC 5.2 10/28/2019   HGB 13.8 10/28/2019   HCT 40.5 10/28/2019   MCV 95.5 10/28/2019   PLT 181 10/28/2019    Lab Results  Component Value Date   CREATININE 1.36 (H) 10/29/2019    No results found for: "PSA"  No results found for: "TESTOSTERONE"  Lab Results  Component Value Date   HGBA1C 5.7 (H) 10/28/2019    Urinalysis    Component Value Date/Time   COLORURINE YELLOW 10/27/2019 1516   APPEARANCEUR Clear 07/20/2022 1040   LABSPEC 1.016 10/27/2019 1516   PHURINE 7.0 10/27/2019 1516   GLUCOSEU Negative 07/20/2022 1040   HGBUR NEGATIVE 10/27/2019 1516   BILIRUBINUR Negative 07/20/2022 1040   KETONESUR 5 (A) 10/27/2019 1516   PROTEINUR Negative 07/20/2022 1040   PROTEINUR NEGATIVE 10/27/2019 1516   NITRITE Negative  07/20/2022 1040   NITRITE NEGATIVE 10/27/2019 1516   LEUKOCYTESUR Trace (A) 07/20/2022 1040   LEUKOCYTESUR NEGATIVE 10/27/2019 1516    Lab Results  Component Value Date   LABMICR See below: 07/20/2022   WBCUA 0-5 07/20/2022   LABEPIT None seen 07/20/2022   BACTERIA None seen 07/20/2022    Pertinent Imaging:  No results found for this or any previous visit.  No results found for this or any previous visit.  No results found for this or any previous visit.  No results found for this or any previous visit.  No results found for this or any previous visit.  No results found for this or any previous visit.  No results found for this or any previous visit.  No results  found for this or any previous visit.   Assessment & Plan:    1. Foreskin problem -Patient to continue nystatin cream. Patient wishes to cancel circumcision at this time  2. OAB -Continue mirabegron '25mg'$  daily.   No follow-ups on file.  Nicolette Bang, MD  [facility] [place]

## 2022-09-01 ENCOUNTER — Encounter (HOSPITAL_COMMUNITY): Payer: Medicare PPO

## 2022-09-02 ENCOUNTER — Ambulatory Visit: Admit: 2022-09-02 | Payer: Medicare PPO | Admitting: Urology

## 2022-09-02 SURGERY — CIRCUMCISION, ADULT
Anesthesia: General

## 2022-09-07 ENCOUNTER — Encounter: Payer: Self-pay | Admitting: Urology

## 2022-09-07 NOTE — Patient Instructions (Signed)

## 2022-09-13 ENCOUNTER — Other Ambulatory Visit: Payer: Self-pay | Admitting: Cardiovascular Disease

## 2022-09-13 DIAGNOSIS — I4891 Unspecified atrial fibrillation: Secondary | ICD-10-CM

## 2022-09-13 NOTE — Telephone Encounter (Signed)
Received labs from PCP. Scr 1.0 on 07/27/22. Appropriate dose and refill sent to requested pharmacy.

## 2022-09-13 NOTE — Telephone Encounter (Addendum)
Prescription refill request for Eliquis received. Indication: afib  Last office visit: 11/23/2021, [place] Scr: 1.3, 06/23/2021 Age: 81 Weight: 114.2 kg   Pt is overdue for blood work. Called pt's PCP and LMOM to see if pt had any blood work with in the past year and if so to please fax over a a copy.

## 2022-09-20 ENCOUNTER — Ambulatory Visit: Payer: Medicare PPO | Admitting: Physician Assistant

## 2022-10-19 DIAGNOSIS — H40053 Ocular hypertension, bilateral: Secondary | ICD-10-CM | POA: Diagnosis not present

## 2022-10-19 DIAGNOSIS — H5213 Myopia, bilateral: Secondary | ICD-10-CM | POA: Diagnosis not present

## 2022-10-20 ENCOUNTER — Encounter: Payer: Self-pay | Admitting: Urology

## 2022-10-20 ENCOUNTER — Ambulatory Visit: Payer: Medicare PPO | Admitting: Urology

## 2022-10-20 VITALS — BP 171/82 | HR 86

## 2022-10-20 DIAGNOSIS — N3281 Overactive bladder: Secondary | ICD-10-CM | POA: Diagnosis not present

## 2022-10-20 DIAGNOSIS — N478 Other disorders of prepuce: Secondary | ICD-10-CM | POA: Diagnosis not present

## 2022-10-20 LAB — MICROSCOPIC EXAMINATION
Bacteria, UA: NONE SEEN
RBC, Urine: NONE SEEN /hpf (ref 0–2)
WBC, UA: NONE SEEN /hpf (ref 0–5)

## 2022-10-20 LAB — URINALYSIS, ROUTINE W REFLEX MICROSCOPIC
Bilirubin, UA: NEGATIVE
Glucose, UA: NEGATIVE
Ketones, UA: NEGATIVE
Leukocytes,UA: NEGATIVE
Nitrite, UA: NEGATIVE
Specific Gravity, UA: 1.025 (ref 1.005–1.030)
Urobilinogen, Ur: 0.2 mg/dL (ref 0.2–1.0)
pH, UA: 6 (ref 5.0–7.5)

## 2022-10-20 NOTE — Patient Instructions (Signed)

## 2022-10-20 NOTE — Progress Notes (Signed)
10/20/2022 9:22 AM   Tyler Sparks December 25, 1940 [id number]  Referring provider: Velna Hatchet, MD [place],  [place] [address]  Followup OAB and balanitis   HPI: Tyler Sparks is a 81yo here for followup for OAb and balanitis. His penile adhesions and balanitis has resolved with nystatin cream. He is doing well on mirabegron '25mg'$ . He has nocturia 1x. He has urinary frequency every 2 hours. No incontinence.     PMH: Past Medical History:  Diagnosis Date   Abdominal aortic aneurysm (AAA) ([place]) 2017   Per patient he has AAA and has regular scans with Cardiology following   Atrial fibrillation ([place])    2008   Cancer [facility])    skin   Cholecystitis 10/2018   Gout    HTN (hypertension)    Hyperlipidemia    Nephrolithiasis    Peripheral neuropathy    Sleep apnea     Surgical History: Past Surgical History:  Procedure Laterality Date   ARM SKIN LESION BIOPSY / EXCISION Right 8 15   CHOLECYSTECTOMY N/A 11/01/2018   Procedure: LAPAROSCOPIC CHOLECYSTECTOMY;  Surgeon: Clovis Riley, MD;  Location: [place];  Service: General;  Laterality: N/A;   [place]  2008, 07/2014   TONSILECTOMY/ADENOIDECTOMY WITH MYRINGOTOMY  1950   WISDOM TOOTH EXTRACTION      Home Medications:  Allergies as of 10/20/2022       Reactions   Peanuts [peanut Oil] Anaphylaxis   Eggs Or Egg-derived Products Hives, Itching, Swelling        Medication List        Accurate as of October 20, 2022  9:22 AM. If you have any questions, ask your nurse or doctor.          allopurinol 300 MG tablet Commonly known as: ZYLOPRIM Take 300 mg by mouth at bedtime.   ascorbic acid 500 MG tablet Commonly known as: VITAMIN C Take 500 mg by mouth daily.   atenolol 25 MG tablet Commonly known as: TENORMIN Take 25 mg by mouth daily.   atorvastatin 10 MG tablet Commonly known as: LIPITOR Take 10 mg by mouth at bedtime.   clotrimazole-betamethasone  cream Commonly known as: Lotrisone Apply 1 Application topically 2 (two) times daily.   co-enzyme Q-10 50 MG capsule Take 50 mg by mouth daily.   colchicine 0.6 MG tablet Take 0.6 mg by mouth daily as needed (FOR GOUT FLARES).   diltiazem 180 MG 24 hr capsule Commonly known as: CARDIZEM CD Take 180 mg by mouth daily.   Eliquis 5 MG Tabs tablet Generic drug: apixaban TAKE ONE TABLET BY MOUTH TWICE A DAY   Fish Oil 1000 MG Caps Take 1,000 mg by mouth daily.   fluconazole 100 MG tablet Commonly known as: DIFLUCAN Take 1 tablet (100 mg total) by mouth daily. X 7 days   furosemide 40 MG tablet Commonly known as: LASIX Take 1 tablet (40 mg total) by mouth daily as needed.   Lagevrio 200 MG Caps capsule Generic drug: molnupiravir EUA Take by mouth.   latanoprost 0.005 % ophthalmic solution Commonly known as: XALATAN   loratadine 10 MG tablet Commonly known as: CLARITIN Take 10 mg by mouth daily.   Melatonin 3 MG Caps Take 3 mg by mouth at bedtime.   mirabegron ER 25 MG Tb24 tablet Commonly known as: MYRBETRIQ Take 1 tablet (25 mg total) by mouth daily.   multivitamin tablet Take 1 tablet by mouth daily.  nystatin-triamcinolone ointment Commonly known as: MYCOLOG Apply 1 Application topically 2 (two) times daily.   OSTEO BI-FLEX JOINT SHIELD PO Take 1 tablet by mouth 2 (two) times daily.   valsartan-hydrochlorothiazide 320-12.5 MG tablet Commonly known as: DIOVAN-HCT Take 1 tablet by mouth daily.   vitamin E 1000 UNIT capsule Take 1,000 Units by mouth daily.        Allergies:  Allergies  Allergen Reactions   Peanuts [Peanut Oil] Anaphylaxis   Eggs Or Egg-Derived Products Hives, Itching and Swelling    Family History: Family History  Problem Relation Age of Onset   Heart attack Father    Heart attack Mother    Colon cancer Neg Hx    Esophageal cancer Neg Hx    Rectal cancer Neg Hx    Stomach cancer Neg Hx     Social History:  reports that  he quit smoking about 48 years ago. His smoking use included cigarettes. He has a 14.00 pack-year smoking history. He has never used smokeless tobacco. He reports current alcohol use of about 14.0 standard drinks of alcohol per week. He reports that he does not use drugs.  ROS: All other review of systems were reviewed and are negative except what is noted above in HPI  Physical Exam: BP (!) 171/82   Pulse 86   Constitutional:  Alert and oriented, No acute distress. HEENT: [place] AT, moist mucus membranes.  Trachea midline, no masses. Cardiovascular: No clubbing, cyanosis, or edema. Respiratory: Normal respiratory effort, no increased work of breathing. GI: Abdomen is soft, nontender, nondistended, no abdominal masses GU: No CVA tenderness.  Lymph: No cervical or inguinal lymphadenopathy. Skin: No rashes, bruises or suspicious lesions. Neurologic: Grossly intact, no focal deficits, moving all 4 extremities. Psychiatric: Normal mood and affect.  Laboratory Data: Lab Results  Component Value Date   WBC 5.2 10/28/2019   HGB 13.8 10/28/2019   HCT 40.5 10/28/2019   MCV 95.5 10/28/2019   PLT 181 10/28/2019    Lab Results  Component Value Date   CREATININE 1.36 (H) 10/29/2019    No results found for: "PSA"  No results found for: "TESTOSTERONE"  Lab Results  Component Value Date   HGBA1C 5.7 (H) 10/28/2019    Urinalysis    Component Value Date/Time   COLORURINE YELLOW 10/27/2019 1516   APPEARANCEUR Clear 07/20/2022 1040   LABSPEC 1.016 10/27/2019 1516   PHURINE 7.0 10/27/2019 1516   GLUCOSEU Negative 07/20/2022 1040   HGBUR NEGATIVE 10/27/2019 1516   BILIRUBINUR Negative 07/20/2022 1040   KETONESUR 5 (A) 10/27/2019 1516   PROTEINUR Negative 07/20/2022 1040   PROTEINUR NEGATIVE 10/27/2019 1516   NITRITE Negative 07/20/2022 1040   NITRITE NEGATIVE 10/27/2019 1516   LEUKOCYTESUR Trace (A) 07/20/2022 1040   LEUKOCYTESUR NEGATIVE 10/27/2019 1516    Lab Results  Component  Value Date   LABMICR See below: 07/20/2022   WBCUA 0-5 07/20/2022   LABEPIT None seen 07/20/2022   BACTERIA None seen 07/20/2022    Pertinent Imaging:  No results found for this or any previous visit.  No results found for this or any previous visit.  No results found for this or any previous visit.  No results found for this or any previous visit.  No results found for this or any previous visit.  No valid procedures specified. No results found for this or any previous visit.  No results found for this or any previous visit.   Assessment & Plan:    1. Foreskin problem -Nystatin  prn - Urinalysis, Routine w reflex microscopic  2. OAB (overactive bladder) -We will trial increasing mirabegron to 58m  - Urinalysis, Routine w reflex microscopic   No follow-ups on file.  PNicolette Bang MD  [facility]raf

## 2022-11-24 DIAGNOSIS — I10 Essential (primary) hypertension: Secondary | ICD-10-CM | POA: Diagnosis not present

## 2022-11-24 DIAGNOSIS — G4733 Obstructive sleep apnea (adult) (pediatric): Secondary | ICD-10-CM | POA: Diagnosis not present

## 2022-12-01 ENCOUNTER — Encounter: Payer: Self-pay | Admitting: Urology

## 2022-12-01 ENCOUNTER — Other Ambulatory Visit: Payer: Self-pay

## 2022-12-01 MED ORDER — MIRABEGRON ER 50 MG PO TB24: 50.0000 mg | ORAL_TABLET | Freq: Every day | ORAL | 11 refills | Status: DC

## 2022-12-23 DIAGNOSIS — H401131 Primary open-angle glaucoma, bilateral, mild stage: Secondary | ICD-10-CM | POA: Diagnosis not present

## 2022-12-24 DIAGNOSIS — L570 Actinic keratosis: Secondary | ICD-10-CM | POA: Diagnosis not present

## 2022-12-24 DIAGNOSIS — L821 Other seborrheic keratosis: Secondary | ICD-10-CM | POA: Diagnosis not present

## 2022-12-24 DIAGNOSIS — D044 Carcinoma in situ of skin of scalp and neck: Secondary | ICD-10-CM | POA: Diagnosis not present

## 2022-12-24 DIAGNOSIS — Z85828 Personal history of other malignant neoplasm of skin: Secondary | ICD-10-CM | POA: Diagnosis not present

## 2022-12-24 DIAGNOSIS — L814 Other melanin hyperpigmentation: Secondary | ICD-10-CM | POA: Diagnosis not present

## 2023-01-17 ENCOUNTER — Telehealth: Payer: Self-pay

## 2023-01-17 NOTE — Telephone Encounter (Signed)
Patient cancelled Feb 2 appointment.  The increase in medication has helped.  Wants to know when Dr. Alyson Ingles recommends to come back for a follow up.  Please advise.

## 2023-01-21 ENCOUNTER — Ambulatory Visit: Payer: Medicare PPO | Admitting: Urology

## 2023-02-03 DIAGNOSIS — H401131 Primary open-angle glaucoma, bilateral, mild stage: Secondary | ICD-10-CM | POA: Diagnosis not present

## 2023-02-03 DIAGNOSIS — H2513 Age-related nuclear cataract, bilateral: Secondary | ICD-10-CM | POA: Diagnosis not present

## 2023-02-14 DIAGNOSIS — G4733 Obstructive sleep apnea (adult) (pediatric): Secondary | ICD-10-CM | POA: Diagnosis not present

## 2023-03-12 ENCOUNTER — Other Ambulatory Visit: Payer: Self-pay | Admitting: Cardiovascular Disease

## 2023-03-12 DIAGNOSIS — I4891 Unspecified atrial fibrillation: Secondary | ICD-10-CM

## 2023-03-14 NOTE — Telephone Encounter (Signed)
Prescription refill request for Eliquis received.  Over due for office visit. Message sent to schedulers

## 2023-03-16 NOTE — Telephone Encounter (Signed)
Pt last saw Dr Angelena Form 11/23/21, pt is overdue for follow-up.  Pt is scheduled to see Ambrose Pancoast, NP on 04/29/23.  Last labs from PCP [facility] Creat 1.0 on 07/27/22, age 82, weight 114.2kg, based on specified criteria pt is on appropriate dosage of Eliquis 5mg  BID for afib.  Will refill rx to get pt to upcoming appt since pt is overdue for follow-up.

## 2023-04-28 NOTE — Progress Notes (Signed)
Office Visit    Patient Name: Tyler Sparks Date of Encounter: 04/29/2023  Primary Care Provider:  Alysia Penna, MD Primary Cardiologist:  Verne Carrow, MD Primary Electrophysiologist: None   Past Medical History    Past Medical History:  Diagnosis Date   Abdominal aortic aneurysm (AAA) ([facility]) 2017   Per patient he has AAA and has regular scans with Cardiology following   Atrial fibrillation ([facility])    2008   Cancer [facility])    skin   Cholecystitis 10/2018   Gout    HTN (hypertension)    Hyperlipidemia    Nephrolithiasis    Peripheral neuropathy    Sleep apnea    Past Surgical History:  Procedure Laterality Date   ARM SKIN LESION BIOPSY / EXCISION Right 8 15   CHOLECYSTECTOMY N/A 11/01/2018   Procedure: LAPAROSCOPIC CHOLECYSTECTOMY;  Surgeon: Berna Bue, MD;  Location: [facility] OR;  Service: General;  Laterality: N/A;   HERNIA REPAIR  1974   SKIN CANCER EXCISION  2008, 07/2014   TONSILECTOMY/ADENOIDECTOMY WITH MYRINGOTOMY  1950   WISDOM TOOTH EXTRACTION      Allergies  Allergies  Allergen Reactions   Peanuts [Peanut Oil] Anaphylaxis   Egg-Derived Products Hives, Itching and Swelling     History of Present Illness    Tyler Sparks  is a 82 year old male with a PMH of HTN, paroxysmal AF (on Eliquis), HLD, OSA, thoracic aortic aneurysm, CVA 10/2019, mild bilateral carotid artery disease who presents today for 1 year follow-up.  Tyler Sparks was initially seen by Dr. Clifton James on 06/2014 to establish care.  He had recently relocated to [facility] from [facility] area.  He was diagnosed with AF with RVR in 2008 that converted with IV Cardizem.  2D echo was completed 09/2015 with normal LV systolic function and no valvular disease.  He completed a CTA in 2018 that showed stable ascending thoracic aorta at 42 mm.  He suffered a CVA in the left MCA on 07/2019 and patient's Xarelto was switched to Eliquis.  He underwent bilateral carotid artery Dopplers  that showed mild bilateral disease.  He was last seen by Dr. Clifton James on 11/2021 for annual follow-up.  During his visit patient was doing well with no new cardiac complaints.  He did report some lower extremity edema that resolved overnight.  He was in rate controlled AF during visit.  He underwent a repeat cardiac CTA on 05/2022 that showed stable ascending aortic aneurysm.  Tyler Sparks presents today alone for 1 year follow-up.  Since last being seen in the office patient reports that he has been doing well with no new cardiac complaints.  His blood pressure today were initially elevated at 158/90 and on recheck was 142/84.  In reviewing his home blood pressure records he has maintained good control with his current medical condition regimen.  He denies any adverse reactions with his current medications.  He is currently being treated by urology for overactive bladder and is continuing to use his Lasix as needed.  He is euvolemic on exam today with +1 lower extremity swelling due to venous stasis.  He is staying active and walks on the treadmill at his facility and lifts light weights with high repetition.  Patient denies chest pain, palpitations, dyspnea, PND, orthopnea, nausea, vomiting, dizziness, syncope, edema, weight gain, or early satiety.  Home Medications    Current Outpatient Medications  Medication Sig Dispense Refill   allopurinol (ZYLOPRIM) 300 MG tablet Take 300 mg by  mouth at bedtime.      atenolol (TENORMIN) 25 MG tablet Take 25 mg by mouth daily.     atorvastatin (LIPITOR) 10 MG tablet Take 10 mg by mouth at bedtime.      clotrimazole-betamethasone (LOTRISONE) cream Apply 1 Application topically 2 (two) times daily. 30 g 3   co-enzyme Q-10 50 MG capsule Take 50 mg by mouth daily.     colchicine 0.6 MG tablet Take 0.6 mg by mouth daily as needed (FOR GOUT FLARES).      diltiazem (CARDIZEM CD) 180 MG 24 hr capsule Take 180 mg by mouth daily.     furosemide (LASIX) 40 MG tablet Take 1  tablet (40 mg total) by mouth daily as needed. 90 tablet 3   latanoprost (XALATAN) 0.005 % ophthalmic solution      loratadine (CLARITIN) 10 MG tablet Take 10 mg by mouth daily.     Melatonin 3 MG CAPS Take 3 mg by mouth at bedtime.      mirabegron ER (MYRBETRIQ) 50 MG TB24 tablet Take 1 tablet (50 mg total) by mouth daily. 30 tablet 11   Misc Natural Products (OSTEO BI-FLEX JOINT SHIELD PO) Take 1 tablet by mouth 2 (two) times daily.      Multiple Vitamin (MULTIVITAMIN) tablet Take 1 tablet by mouth daily.     Omega-3 Fatty Acids (FISH OIL) 1000 MG CAPS Take 1,000 mg by mouth daily.      valsartan-hydrochlorothiazide (DIOVAN-HCT) 320-12.5 MG tablet Take 1 tablet by mouth daily.     vitamin C (ASCORBIC ACID) 500 MG tablet Take 500 mg by mouth daily.     vitamin E 1000 UNIT capsule Take 1,000 Units by mouth daily.     apixaban (ELIQUIS) 5 MG TABS tablet Take 1 tablet (5 mg total) by mouth 2 (two) times daily. 180 tablet 3   No current facility-administered medications for this visit.     Review of Systems  Please see the history of present illness.    (+) Frequent urination (+) Shortness of breath with exertion  All other systems reviewed and are otherwise negative except as noted above.  Physical Exam    Wt Readings from Last 3 Encounters:  04/29/23 266 lb 9.6 oz (120.9 kg)  11/23/21 251 lb 12.8 oz (114.2 kg)  10/29/20 261 lb 3.2 oz (118.5 kg)   VS: Vitals:   04/29/23 1125 04/29/23 1145  BP: (!) 158/90 (!) 142/84  Pulse: 70   SpO2: 96%   ,Body mass index is 38.81 kg/m.  Constitutional:      Appearance: Healthy appearance. Not in distress.  Neck:     Vascular: JVD normal.  Pulmonary:     Effort: Pulmonary effort is normal.     Breath sounds: No wheezing. No rales. Diminished in the bases Cardiovascular:     Normal rate. Regular rhythm. Normal S1. Normal S2.      Murmurs: There is no murmur.  Edema:    Peripheral edema absent.  Abdominal:     Palpations: Abdomen is  soft non tender. There is no hepatomegaly.  Skin:    General: Skin is warm and dry.  Neurological:     General: No focal deficit present.     Mental Status: Alert and oriented to person, place and time.     Cranial Nerves: Cranial nerves are intact.  EKG/LABS/ Recent Cardiac Studies    ECG personally reviewed by me today -atrial fibrillation with rate of 70 bpm and no acute changes consistent  with previous EKG.  Cardiac Studies & Procedures       ECHOCARDIOGRAM  ECHOCARDIOGRAM COMPLETE 10/28/2019  Narrative ECHOCARDIOGRAM REPORT    Patient Name:   ANDUIN KOPITZKE Date of Exam: 10/28/2019 Medical Rec #:  [id number]      Height:       69.0 in Accession #:    [id number]     Weight:       252.0 lb Date of Birth:  08/04/1941       BSA:          2.28 m Patient Age:    78 years       BP:           130/73 mmHg Patient Gender: M              HR:           69 bpm. Exam Location:  Inpatient  Procedure: 2D Echo  Indications:    stroke 434.91  History:        Patient has prior history of Echocardiogram examinations, most recent 10/15/2015. Arrythmias:Atrial Fibrillation Risk Factors:Hypertension, Dyslipidemia and Sleep Apnea.  Sonographer:    Delcie Roch Referring Phys: Patrice.Shin MOHAMMAD L GARBA  IMPRESSIONS   1. Left ventricular ejection fraction, by visual estimation, is 55%. The left ventricle has normal function. There is mildly increased left ventricular hypertrophy. 2. Left ventricular diastolic parameters are indeterminate in the setting of atrial fibrillation/flutter. 3. Global right ventricle has mildly reduced systolic function.The right ventricular size is normal. No increase in right ventricular wall thickness. 4. Left atrial size was moderately dilated. 5. Right atrial size was normal. 6. Mild aortic valve annular calcification. 7. Mild mitral annular calcification. 8. The mitral valve is grossly normal. Mild mitral valve regurgitation. 9. The tricuspid valve is  grossly normal. Tricuspid valve regurgitation is mild. 10. The aortic valve is tricuspid. Aortic valve regurgitation is not visualized. Mild aortic valve sclerosis without stenosis. 11. The pulmonic valve was grossly normal. Pulmonic valve regurgitation is trivial. 12. Mildly elevated pulmonary artery systolic pressure. 13. The inferior vena cava is dilated in size with >50% respiratory variability, suggesting right atrial pressure of 8 mmHg. 14. The tricuspid regurgitant velocity is 2.76 m/s, and with an assumed right atrial pressure of 8 mmHg, the estimated right ventricular systolic pressure is mildly elevated at 38.5 mmHg.  FINDINGS Left Ventricle: Left ventricular ejection fraction, by visual estimation, is 55%. The left ventricle has normal function. The left ventricular internal cavity size was the left ventricle is normal in size. There is mildly increased left ventricular hypertrophy. Left ventricular diastolic parameters are indeterminate.  Right Ventricle: The right ventricular size is normal. No increase in right ventricular wall thickness. Global RV systolic function is has mildly reduced systolic function. The tricuspid regurgitant velocity is 2.76 m/s, and with an assumed right atrial pressure of 8 mmHg, the estimated right ventricular systolic pressure is mildly elevated at 38.5 mmHg.  Left Atrium: Left atrial size was moderately dilated.  Right Atrium: Right atrial size was normal in size  Pericardium: There is no evidence of pericardial effusion.  Mitral Valve: The mitral valve is grossly normal. Mild mitral annular calcification. Mild mitral valve regurgitation.  Tricuspid Valve: The tricuspid valve is grossly normal. Tricuspid valve regurgitation is mild.  Aortic Valve: The aortic valve is tricuspid. Aortic valve regurgitation is not visualized. Mild aortic valve sclerosis is present, with no evidence of aortic valve stenosis. Mild aortic valve annular  calcification.  Pulmonic Valve: The  pulmonic valve was grossly normal. Pulmonic valve regurgitation is trivial.  Aorta: The aortic root is normal in size and structure.  Venous: The inferior vena cava is dilated in size with greater than 50% respiratory variability, suggesting right atrial pressure of 8 mmHg.  IAS/Shunts: No atrial level shunt detected by color flow Doppler.    LEFT VENTRICLE PLAX 2D LVIDd:         5.10 cm LVIDs:         3.40 cm LV PW:         1.20 cm LV IVS:        1.10 cm LVOT diam:     2.20 cm LV SV:         76 ml LV SV Index:   31.75 LVOT Area:     3.80 cm   RIGHT VENTRICLE RV S prime:     10.80 cm/s TAPSE (M-mode): 1.6 cm  LEFT ATRIUM              Index       RIGHT ATRIUM           Index LA diam:        5.40 cm  2.37 cm/m  RA Area:     24.40 cm LA Vol (A2C):   71.0 ml  31.15 ml/m RA Volume:   68.60 ml  30.10 ml/m LA Vol (A4C):   103.0 ml 45.19 ml/m LA Biplane Vol: 90.4 ml  39.66 ml/m AORTIC VALVE LVOT Vmax:   69.80 cm/s LVOT Vmean:  47.000 cm/s LVOT VTI:    0.180 m  AORTA Ao Root diam: 3.70 cm  TRICUSPID VALVE TR Peak grad:   30.5 mmHg TR Vmax:        276.00 cm/s  SHUNTS Systemic VTI:  0.18 m Systemic Diam: 2.20 cm   Nona Dell MD Electronically signed by Nona Dell MD Signature Date/Time: 10/28/2019/3:37:54 PM    Final             Risk Assessment/Calculations:    CHA2DS2-VASc Score = 6   This indicates a 9.7% annual risk of stroke. The patient's score is based upon: CHF History: 0 HTN History: 1 Diabetes History: 0 Stroke History: 2 Vascular Disease History: 1 Age Score: 2 Gender Score: 0           Lab Results  Component Value Date   WBC 5.2 10/28/2019   HGB 13.8 10/28/2019   HCT 40.5 10/28/2019   MCV 95.5 10/28/2019   PLT 181 10/28/2019   Lab Results  Component Value Date   CREATININE 1.36 (H) 10/29/2019   BUN 17 10/29/2019   NA 140 10/29/2019   K 3.5 10/29/2019   CL 106 10/29/2019    CO2 26 10/29/2019   Lab Results  Component Value Date   ALT 21 10/28/2019   AST 25 10/28/2019   ALKPHOS 58 10/28/2019   BILITOT 0.9 10/28/2019   Lab Results  Component Value Date   CHOL 126 10/28/2019   HDL 38 (L) 10/28/2019   LDLCALC 73 10/28/2019   TRIG 74 10/28/2019   CHOLHDL 3.3 10/28/2019    Lab Results  Component Value Date   HGBA1C 5.7 (H) 10/28/2019     Assessment & Plan    1.  Persistent atrial fibrillation: -Today patient is rate controlled at 70 bpm. -Continue Cardizem 180 mg daily, atenolol 25 mg -Patient's last hemoglobin was 15.2 and creatinine was 1.0 -Patient denies any inappropriate bleeding with current anticoagulation. -CHA2DS2-VASc Score = 6 [  CHF History: 0, HTN History: 1, Diabetes History: 0, Stroke History: 2, Vascular Disease History: 1, Age Score: 2, Gender Score: 0].  Therefore, the patient's annual risk of stroke is 9.7 %.     -Continue Eliquis 5 mg twice daily   2.  Nonobstructive atherosclerosis: -Continue Lipitor 10 mg daily -Maintain low-sodium heart healthy diet. -Patient has no symptoms of angina today  3.  History of CVA: -s/p acute CVA 10/2019  -Patient reports no residual effects  -Continue Eliquis 5 mg twice daily and Lipitor 10 mg daily  4.  Ascending thoracic aortic aneurysm: -CT of the chest completed 05/2022 showing stable aneurysm -Continue atorvastatin 10 mg daily -We will repeat CT of the chest for surveillance of aortic aneurysm. -Patient was advised to abstain from heavy lifting and continue good blood pressure control.  5.  Essential hypertension: -Patient blood pressure today was initially elevated at 158/90 and was 142/84 on recheck. -BP readings at home are well-controlled. -Continue current BP regimen with Diovan-HCTZ 320-12.5 mg, atenolol 25 mg, Cardizem 180 mg daily  6.  Hyperlipidemia: -Patient's last LDL cholesterol was 76 -Continue Lipitor 10 mg daily  Disposition: Follow-up with Verne Carrow, MD  or APP in 12 months    Medication Adjustments/Labs and Tests Ordered: Current medicines are reviewed at length with the patient today.  Concerns regarding medicines are outlined above.   Signed, Napoleon Form, Leodis Rains, NP 04/29/2023, 12:58 PM [facility]

## 2023-04-29 ENCOUNTER — Ambulatory Visit: Payer: Medicare PPO | Attending: Nurse Practitioner | Admitting: Nurse Practitioner

## 2023-04-29 ENCOUNTER — Encounter: Payer: Self-pay | Admitting: Nurse Practitioner

## 2023-04-29 VITALS — BP 142/84 | HR 70 | Ht 69.5 in | Wt 266.6 lb

## 2023-04-29 DIAGNOSIS — I4891 Unspecified atrial fibrillation: Secondary | ICD-10-CM

## 2023-04-29 DIAGNOSIS — I10 Essential (primary) hypertension: Secondary | ICD-10-CM | POA: Diagnosis not present

## 2023-04-29 DIAGNOSIS — I2510 Atherosclerotic heart disease of native coronary artery without angina pectoris: Secondary | ICD-10-CM

## 2023-04-29 DIAGNOSIS — E785 Hyperlipidemia, unspecified: Secondary | ICD-10-CM | POA: Diagnosis not present

## 2023-04-29 DIAGNOSIS — I639 Cerebral infarction, unspecified: Secondary | ICD-10-CM

## 2023-04-29 LAB — CBC
Hematocrit: 42.2 % (ref 37.5–51.0)
Hemoglobin: 14.3 g/dL (ref 13.0–17.7)
MCH: 32.9 pg (ref 26.6–33.0)
MCHC: 33.9 g/dL (ref 31.5–35.7)
MCV: 97 fL (ref 79–97)
Platelets: 158 10*3/uL (ref 150–450)
RBC: 4.35 x10E6/uL (ref 4.14–5.80)
RDW: 14.7 % (ref 11.6–15.4)
WBC: 4 10*3/uL (ref 3.4–10.8)

## 2023-04-29 MED ORDER — APIXABAN 5 MG PO TABS: 5.0000 mg | ORAL_TABLET | Freq: Two times a day (BID) | ORAL | 3 refills | Status: DC

## 2023-04-29 NOTE — Patient Instructions (Addendum)
Medication Instructions:  Your physician recommends that you continue on your current medications as directed. Please refer to the Current Medication list given to you today. *If you need a refill on your cardiac medications before your next appointment, please call your pharmacy*   Lab Work: TODAY-CBC  1 WEEK BEFORE CT TEST BMET If you have labs (blood work) drawn today and your tests are completely normal, you will receive your results only by: MyChart Message (if you have MyChart) OR A paper copy in the mail If you have any lab test that is abnormal or we need to change your treatment, we will call you to review the results.   Testing/Procedures:  CT ANGIO CHEST AORTA W/CM & OR WO/CM    Follow-Up: At [facility], you and your health needs are our priority.  As part of our continuing mission to provide you with exceptional heart care, we have created designated Provider Care Teams.  These Care Teams include your primary Cardiologist (physician) and Advanced Practice Providers (APPs -  Physician Assistants and Nurse Practitioners) who all work together to provide you with the care you need, when you need it.  We recommend signing up for the patient portal called "MyChart".  Sign up information is provided on this After Visit Summary.  MyChart is used to connect with patients for Virtual Visits (Telemedicine).  Patients are able to view lab/test results, encounter notes, upcoming appointments, etc.  Non-urgent messages can be sent to your provider as well.   To learn more about what you can do with MyChart, go to [email or web address].    Your next appointment:   12 month(s)  Provider:   Verne Carrow, MD     Other Instructions

## 2023-05-06 DIAGNOSIS — H401131 Primary open-angle glaucoma, bilateral, mild stage: Secondary | ICD-10-CM | POA: Diagnosis not present

## 2023-05-18 DIAGNOSIS — G4733 Obstructive sleep apnea (adult) (pediatric): Secondary | ICD-10-CM | POA: Diagnosis not present

## 2023-06-03 ENCOUNTER — Ambulatory Visit: Payer: Medicare PPO | Attending: Nurse Practitioner

## 2023-06-03 DIAGNOSIS — I2510 Atherosclerotic heart disease of native coronary artery without angina pectoris: Secondary | ICD-10-CM | POA: Diagnosis not present

## 2023-06-03 DIAGNOSIS — I4891 Unspecified atrial fibrillation: Secondary | ICD-10-CM

## 2023-06-03 DIAGNOSIS — I10 Essential (primary) hypertension: Secondary | ICD-10-CM

## 2023-06-03 DIAGNOSIS — I639 Cerebral infarction, unspecified: Secondary | ICD-10-CM | POA: Diagnosis not present

## 2023-06-03 DIAGNOSIS — E785 Hyperlipidemia, unspecified: Secondary | ICD-10-CM

## 2023-06-04 LAB — BASIC METABOLIC PANEL
BUN/Creatinine Ratio: 24 (ref 10–24)
BUN: 24 mg/dL (ref 8–27)
CO2: 27 mmol/L (ref 20–29)
Calcium: 8.8 mg/dL (ref 8.6–10.2)
Chloride: 107 mmol/L — ABNORMAL HIGH (ref 96–106)
Creatinine, Ser: 1.01 mg/dL (ref 0.76–1.27)
Glucose: 110 mg/dL — ABNORMAL HIGH (ref 70–99)
Potassium: 3.5 mmol/L (ref 3.5–5.2)
Sodium: 148 mmol/L — ABNORMAL HIGH (ref 134–144)
eGFR: 75 mL/min/{1.73_m2} (ref >59)

## 2023-06-10 ENCOUNTER — Ambulatory Visit (HOSPITAL_COMMUNITY)
Admission: RE | Admit: 2023-06-10 | Discharge: 2023-06-10 | Disposition: A | Discharge status: Home / Self Care | Payer: Medicare PPO | Source: Ambulatory Visit | Attending: Nurse Practitioner | Admitting: Nurse Practitioner

## 2023-06-10 ENCOUNTER — Ambulatory Visit (HOSPITAL_COMMUNITY): Admission: RE | Admit: 2023-06-10 | Payer: Medicare PPO | Source: Ambulatory Visit

## 2023-06-10 DIAGNOSIS — I10 Essential (primary) hypertension: Secondary | ICD-10-CM

## 2023-06-10 DIAGNOSIS — I4891 Unspecified atrial fibrillation: Secondary | ICD-10-CM

## 2023-06-10 DIAGNOSIS — I639 Cerebral infarction, unspecified: Secondary | ICD-10-CM | POA: Insufficient documentation

## 2023-06-10 DIAGNOSIS — I2510 Atherosclerotic heart disease of native coronary artery without angina pectoris: Secondary | ICD-10-CM | POA: Diagnosis not present

## 2023-06-10 DIAGNOSIS — E785 Hyperlipidemia, unspecified: Secondary | ICD-10-CM | POA: Insufficient documentation

## 2023-06-10 DIAGNOSIS — I7121 Aneurysm of the ascending aorta, without rupture: Secondary | ICD-10-CM | POA: Diagnosis not present

## 2023-06-10 MED ORDER — IOHEXOL 350 MG/ML SOLN
100.0000 mL | Freq: Once | INTRAVENOUS | Status: AC | PRN
  Administered 2023-06-10: 100 mL via INTRAVENOUS

## 2023-06-10 MED ORDER — SODIUM CHLORIDE (PF) 0.9 % IJ SOLN
INTRAMUSCULAR | Status: AC
  Filled 2023-06-10: qty 50

## 2023-06-15 ENCOUNTER — Other Ambulatory Visit: Payer: Self-pay

## 2023-06-15 DIAGNOSIS — I7121 Aneurysm of the ascending aorta, without rupture: Secondary | ICD-10-CM

## 2023-06-17 DIAGNOSIS — H401131 Primary open-angle glaucoma, bilateral, mild stage: Secondary | ICD-10-CM | POA: Diagnosis not present

## 2023-06-17 DIAGNOSIS — H2513 Age-related nuclear cataract, bilateral: Secondary | ICD-10-CM | POA: Diagnosis not present

## 2023-06-27 DIAGNOSIS — L821 Other seborrheic keratosis: Secondary | ICD-10-CM | POA: Diagnosis not present

## 2023-06-27 DIAGNOSIS — Z85828 Personal history of other malignant neoplasm of skin: Secondary | ICD-10-CM | POA: Diagnosis not present

## 2023-06-27 DIAGNOSIS — L570 Actinic keratosis: Secondary | ICD-10-CM | POA: Diagnosis not present

## 2023-06-27 DIAGNOSIS — D692 Other nonthrombocytopenic purpura: Secondary | ICD-10-CM | POA: Diagnosis not present

## 2023-06-27 DIAGNOSIS — L218 Other seborrheic dermatitis: Secondary | ICD-10-CM | POA: Diagnosis not present

## 2023-06-27 DIAGNOSIS — D1801 Hemangioma of skin and subcutaneous tissue: Secondary | ICD-10-CM | POA: Diagnosis not present

## 2023-06-27 DIAGNOSIS — L814 Other melanin hyperpigmentation: Secondary | ICD-10-CM | POA: Diagnosis not present

## 2023-06-27 DIAGNOSIS — C44519 Basal cell carcinoma of skin of other part of trunk: Secondary | ICD-10-CM | POA: Diagnosis not present

## 2023-07-13 DIAGNOSIS — C44519 Basal cell carcinoma of skin of other part of trunk: Secondary | ICD-10-CM | POA: Diagnosis not present

## 2023-07-13 DIAGNOSIS — Z85828 Personal history of other malignant neoplasm of skin: Secondary | ICD-10-CM | POA: Diagnosis not present

## 2023-08-12 DIAGNOSIS — M109 Gout, unspecified: Secondary | ICD-10-CM | POA: Diagnosis not present

## 2023-08-12 DIAGNOSIS — I10 Essential (primary) hypertension: Secondary | ICD-10-CM | POA: Diagnosis not present

## 2023-08-12 DIAGNOSIS — R3915 Urgency of urination: Secondary | ICD-10-CM | POA: Diagnosis not present

## 2023-08-12 DIAGNOSIS — E785 Hyperlipidemia, unspecified: Secondary | ICD-10-CM | POA: Diagnosis not present

## 2023-08-12 DIAGNOSIS — R7989 Other specified abnormal findings of blood chemistry: Secondary | ICD-10-CM | POA: Diagnosis not present

## 2023-08-19 DIAGNOSIS — I2510 Atherosclerotic heart disease of native coronary artery without angina pectoris: Secondary | ICD-10-CM | POA: Diagnosis not present

## 2023-08-19 DIAGNOSIS — I712 Thoracic aortic aneurysm, without rupture, unspecified: Secondary | ICD-10-CM | POA: Diagnosis not present

## 2023-08-19 DIAGNOSIS — Z1339 Encounter for screening examination for other mental health and behavioral disorders: Secondary | ICD-10-CM | POA: Diagnosis not present

## 2023-08-19 DIAGNOSIS — I700 Atherosclerosis of aorta: Secondary | ICD-10-CM | POA: Diagnosis not present

## 2023-08-19 DIAGNOSIS — D692 Other nonthrombocytopenic purpura: Secondary | ICD-10-CM | POA: Diagnosis not present

## 2023-08-19 DIAGNOSIS — Z0000 Encounter for general adult medical examination without abnormal findings: Secondary | ICD-10-CM | POA: Diagnosis not present

## 2023-08-19 DIAGNOSIS — I10 Essential (primary) hypertension: Secondary | ICD-10-CM | POA: Diagnosis not present

## 2023-08-19 DIAGNOSIS — R82998 Other abnormal findings in urine: Secondary | ICD-10-CM | POA: Diagnosis not present

## 2023-08-19 DIAGNOSIS — I480 Paroxysmal atrial fibrillation: Secondary | ICD-10-CM | POA: Diagnosis not present

## 2023-08-19 DIAGNOSIS — Z23 Encounter for immunization: Secondary | ICD-10-CM | POA: Diagnosis not present

## 2023-08-19 DIAGNOSIS — R7302 Impaired glucose tolerance (oral): Secondary | ICD-10-CM | POA: Diagnosis not present

## 2023-08-19 DIAGNOSIS — Z1331 Encounter for screening for depression: Secondary | ICD-10-CM | POA: Diagnosis not present

## 2023-10-06 DIAGNOSIS — G4733 Obstructive sleep apnea (adult) (pediatric): Secondary | ICD-10-CM | POA: Diagnosis not present

## 2023-11-30 DIAGNOSIS — I10 Essential (primary) hypertension: Secondary | ICD-10-CM | POA: Diagnosis not present

## 2023-11-30 DIAGNOSIS — G4733 Obstructive sleep apnea (adult) (pediatric): Secondary | ICD-10-CM | POA: Diagnosis not present

## 2023-12-01 ENCOUNTER — Other Ambulatory Visit: Payer: Self-pay | Admitting: Urology

## 2023-12-01 DIAGNOSIS — H401431 Capsular glaucoma with pseudoexfoliation of lens, bilateral, mild stage: Secondary | ICD-10-CM | POA: Diagnosis not present

## 2023-12-01 DIAGNOSIS — H524 Presbyopia: Secondary | ICD-10-CM | POA: Diagnosis not present

## 2023-12-01 DIAGNOSIS — H2513 Age-related nuclear cataract, bilateral: Secondary | ICD-10-CM | POA: Diagnosis not present

## 2023-12-07 ENCOUNTER — Other Ambulatory Visit: Payer: Self-pay | Admitting: Urology

## 2023-12-28 DIAGNOSIS — L570 Actinic keratosis: Secondary | ICD-10-CM | POA: Diagnosis not present

## 2023-12-28 DIAGNOSIS — D1801 Hemangioma of skin and subcutaneous tissue: Secondary | ICD-10-CM | POA: Diagnosis not present

## 2023-12-28 DIAGNOSIS — L821 Other seborrheic keratosis: Secondary | ICD-10-CM | POA: Diagnosis not present

## 2023-12-28 DIAGNOSIS — C44519 Basal cell carcinoma of skin of other part of trunk: Secondary | ICD-10-CM | POA: Diagnosis not present

## 2023-12-28 DIAGNOSIS — Z85828 Personal history of other malignant neoplasm of skin: Secondary | ICD-10-CM | POA: Diagnosis not present

## 2023-12-28 DIAGNOSIS — D692 Other nonthrombocytopenic purpura: Secondary | ICD-10-CM | POA: Diagnosis not present

## 2024-01-04 DIAGNOSIS — G4733 Obstructive sleep apnea (adult) (pediatric): Secondary | ICD-10-CM | POA: Diagnosis not present

## 2024-02-01 ENCOUNTER — Ambulatory Visit: Payer: Medicare PPO | Admitting: Urology

## 2024-02-01 VITALS — BP 183/114 | HR 84

## 2024-02-01 DIAGNOSIS — N478 Other disorders of prepuce: Secondary | ICD-10-CM | POA: Diagnosis not present

## 2024-02-01 DIAGNOSIS — N3281 Overactive bladder: Secondary | ICD-10-CM

## 2024-02-01 LAB — URINALYSIS, ROUTINE W REFLEX MICROSCOPIC
Bilirubin, UA: NEGATIVE
Glucose, UA: NEGATIVE
Ketones, UA: NEGATIVE
Leukocytes,UA: NEGATIVE
Nitrite, UA: NEGATIVE
Protein,UA: NEGATIVE
RBC, UA: NEGATIVE
Specific Gravity, UA: 1.02 (ref 1.005–1.030)
Urobilinogen, Ur: 1 mg/dL (ref 0.2–1.0)
pH, UA: 7.5 (ref 5.0–7.5)

## 2024-02-01 MED ORDER — TROSPIUM CHLORIDE ER 60 MG PO CP24
1.0000 | ORAL_CAPSULE | Freq: Every day | ORAL | 11 refills | Status: DC
Start: 2024-02-01 — End: 2024-05-02

## 2024-02-01 MED ORDER — CLOTRIMAZOLE-BETAMETHASONE 1-0.05 % EX CREA: 1.0000 | TOPICAL_CREAM | Freq: Two times a day (BID) | CUTANEOUS | 3 refills | Status: AC

## 2024-02-01 NOTE — Progress Notes (Signed)
 02/01/2024 3:03 PM   Tyler Sparks May 26, 1941 [id number]  Referring provider: Alysia Penna, MD [address] [place],  [place] [address]  Followup OAB and balanitis   HPI: Tyler Sparks is a 82yo here for followup for OAB and balanitis. He is currently on mirabegron 50mg  daily. He has nocturia 1x. IPSS 14 QOL 5. 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 He has worsening urinary urgency. Urine stream is strong. He has rare urinary hesitancy and straining to urinate.    PMH: Past Medical History:  Diagnosis Date   Abdominal aortic aneurysm (AAA) (HCC) 2017   Per patient he has AAA and has regular scans with Cardiology following   Atrial fibrillation (HCC)    2008   Cancer [facility])    skin   Cholecystitis 10/2018   Gout    HTN (hypertension)    Hyperlipidemia    Nephrolithiasis    Peripheral neuropathy    Sleep apnea     Surgical History: Past Surgical History:  Procedure Laterality Date   ARM SKIN LESION BIOPSY / EXCISION Right 8 15   CHOLECYSTECTOMY N/A 11/01/2018   Procedure: LAPAROSCOPIC CHOLECYSTECTOMY;  Surgeon: Berna Bue, MD;  Location: [facility] OR;  Service: General;  Laterality: N/A;   HERNIA REPAIR  1974   SKIN CANCER EXCISION  2008, 07/2014   TONSILECTOMY/ADENOIDECTOMY WITH MYRINGOTOMY  1950   WISDOM TOOTH EXTRACTION      Home Medications:  Allergies as of 02/01/2024       Reactions   Peanuts [peanut Oil] Anaphylaxis   Egg-derived Products Hives, Itching, Swelling        Medication List        Accurate as of February 01, 2024  3:03 PM. If you have any questions, ask your nurse or doctor.          allopurinol 300 MG tablet Commonly known as: ZYLOPRIM Take 300 mg by mouth at bedtime.   apixaban 5 MG Tabs tablet Commonly known as: Eliquis Take 1 tablet (5 mg total) by mouth 2 (two) times daily.   ascorbic acid 500 MG tablet Commonly known as: VITAMIN C Take 500 mg by  mouth daily.   atenolol 25 MG tablet Commonly known as: TENORMIN Take 25 mg by mouth daily.   atorvastatin 10 MG tablet Commonly known as: LIPITOR Take 10 mg by mouth at bedtime.   clotrimazole-betamethasone cream Commonly known as: Lotrisone Apply 1 Application topically 2 (two) times daily.   co-enzyme Q-10 50 MG capsule Take 50 mg by mouth daily.   colchicine 0.6 MG tablet Take 0.6 mg by mouth daily as needed (FOR GOUT FLARES).   diltiazem 180 MG 24 hr capsule Commonly known as: CARDIZEM CD Take 180 mg by mouth daily.   Fish Oil 1000 MG Caps Take 1,000 mg by mouth daily.   furosemide 40 MG tablet Commonly known as: LASIX Take 1 tablet (40 mg total) by mouth daily as needed.   latanoprost 0.005 % ophthalmic solution Commonly known as: XALATAN   loratadine 10 MG tablet Commonly known as: CLARITIN Take 10 mg by mouth daily.   Melatonin 3 MG Caps Take 3 mg by mouth at bedtime.   multivitamin tablet Take 1 tablet by mouth daily.   Myrbetriq 50 MG Tb24 tablet Generic drug: mirabegron ER TAKE 1 TABLET BY MOUTH DAILY   OSTEO BI-FLEX JOINT SHIELD PO Take 1 tablet by mouth 2 (two) times daily.  valsartan-hydrochlorothiazide 320-12.5 MG tablet Commonly known as: DIOVAN-HCT Take 1 tablet by mouth daily.   vitamin E 1000 UNIT capsule Take 1,000 Units by mouth daily.        Allergies:  Allergies  Allergen Reactions   Peanuts [Peanut Oil] Anaphylaxis   Egg-Derived Products Hives, Itching and Swelling    Family History: Family History  Problem Relation Age of Onset   Heart attack Father    Heart attack Mother    Colon cancer Neg Hx    Esophageal cancer Neg Hx    Rectal cancer Neg Hx    Stomach cancer Neg Hx     Social History:  reports that he quit smoking about 50 years ago. His smoking use included cigarettes. He started smoking about 64 years ago. He has a 14 pack-year smoking history. He has never used smokeless tobacco. He reports current  alcohol use of about 14.0 standard drinks of alcohol per week. He reports that he does not use drugs.  ROS: All other review of systems were reviewed and are negative except what is noted above in HPI  Physical Exam: BP (!) 183/114   Pulse 84   Constitutional:  Alert and oriented, No acute distress. HEENT: [place] AT, moist mucus membranes.  Trachea midline, no masses. Cardiovascular: No clubbing, cyanosis, or edema. Respiratory: Normal respiratory effort, no increased work of breathing. GI: Abdomen is soft, nontender, nondistended, no abdominal masses GU: No CVA tenderness.  Lymph: No cervical or inguinal lymphadenopathy. Skin: No rashes, bruises or suspicious lesions. Neurologic: Grossly intact, no focal deficits, moving all 4 extremities. Psychiatric: Normal mood and affect.  Laboratory Data: Lab Results  Component Value Date   WBC 4.0 04/29/2023   HGB 14.3 04/29/2023   HCT 42.2 04/29/2023   MCV 97 04/29/2023   PLT 158 04/29/2023    Lab Results  Component Value Date   CREATININE 1.01 06/03/2023    No results found for: "PSA"  No results found for: "TESTOSTERONE"  Lab Results  Component Value Date   HGBA1C 5.7 (H) 10/28/2019    Urinalysis    Component Value Date/Time   COLORURINE YELLOW 10/27/2019 1516   APPEARANCEUR Clear 10/20/2022 0904   LABSPEC 1.016 10/27/2019 1516   PHURINE 7.0 10/27/2019 1516   GLUCOSEU Negative 10/20/2022 0904   HGBUR NEGATIVE 10/27/2019 1516   BILIRUBINUR Negative 10/20/2022 0904   KETONESUR 5 (A) 10/27/2019 1516   PROTEINUR Trace (A) 10/20/2022 0904   PROTEINUR NEGATIVE 10/27/2019 1516   NITRITE Negative 10/20/2022 0904   NITRITE NEGATIVE 10/27/2019 1516   LEUKOCYTESUR Negative 10/20/2022 0904   LEUKOCYTESUR NEGATIVE 10/27/2019 1516    Lab Results  Component Value Date   LABMICR See below: 10/20/2022   WBCUA None seen 10/20/2022   LABEPIT 0-10 10/20/2022   BACTERIA None seen 10/20/2022    Pertinent Imaging:  No results  found for this or any previous visit.  No results found for this or any previous visit.  No results found for this or any previous visit.  No results found for this or any previous visit.  No results found for this or any previous visit.  No results found for this or any previous visit.  No results found for this or any previous visit.  No results found for this or any previous visit.   Assessment & Plan:    1. Foreskin problem Clotrimazole prn  2. OAB (overactive bladder) (Primary) -we will trial sanctura 60mg  daily. - Urinalysis, Routine w reflex microscopic   No  follow-ups on file.  Wilkie Aye, MD  [facility] [place]

## 2024-02-14 ENCOUNTER — Encounter: Payer: Self-pay | Admitting: Urology

## 2024-02-14 NOTE — Patient Instructions (Signed)
 Balanitis  Balanitis is swelling and irritation of the head of the penis (glans penis). Balanitis occurs most often among males who have not had their foreskin removed (uncircumcised). In uncircumcised males, the condition may also cause inflammation of the skin around the foreskin. Balanitis sometimes causes scarring of the penis or foreskin, which can require surgery. This condition may develop because of an infection or another medical condition. Untreated balanitis can increase the risk of penile cancer. What are the causes? Common causes of this condition include: Irritation and lack of airflow due to fluid (smegma) that can build up on the glans penis. Poor personal hygiene, especially in uncircumcised males. Not cleaning the glans penis and foreskin well can result in a buildup of bacteria, viruses, and yeast, which can lead to infection and inflammation. Other causes include: Chemical irritation from products such as soaps or shower gels, especially those that have fragrance. Chemical irritation can also be caused by condoms, personal lubricants, petroleum jelly, spermicides, fabric softeners, or laundry detergents. Skin conditions, such as eczema, dermatitis, and psoriasis. Allergies to medicines, such as tetracycline and sulfa drugs. What increases the risk? The following factors may make you more likely to develop this condition: Being an uncircumcised male. Having diabetes. Having other medical conditions, including liver cirrhosis, congestive heart failure, or kidney disease. Having infections, such as candidiasis, HPV (human papillomavirus), herpes simplex, gonorrhea, or syphilis. Having a tight foreskin that is difficult to pull back (retract) past the glans penis. Being severely obese. History of reactive arthritis. What are the signs or symptoms? Symptoms of this condition include: Discharge from under the foreskin, and pain or difficulty retracting the foreskin. A bad smell  or itchiness on the penis. Tenderness, redness, and swelling of the glans penis. A rash or sores on the glans penis or foreskin. Inability to get an erection due to pain. Trouble urinating. Scarring of the penis or foreskin, in some cases. How is this diagnosed? This condition may be diagnosed based on a physical exam and tests of a swab of discharge to check for bacterial or fungal infection. You may also have blood tests to check for: Viruses that can cause balanitis. A high blood sugar (glucose) level. This could be a sign of diabetes, which can increase the risk of balanitis. How is this treated? Treatment for this condition depends on the cause. Treatment may include: Improving personal hygiene. Your health care provider may recommend sitting in a bath of warm water that is deep enough to cover your hips and buttocks (sitz bath). Medicines such as: Creams or ointments to reduce swelling (steroids) or to treat an infection. Antibiotic medicine. Antifungal medicine. Having surgery to remove or cut the foreskin (circumcision). This may be done if you have scarring on the foreskin that makes it difficult to retract. Controlling other medical problems that may be causing your condition or making it worse. Follow these instructions at home: Medicines Take over-the-counter and prescription medicines only as told by your health care provider. If you were prescribed an antibiotic medicine, use it as told by your health care provider. Do not stop using the antibiotic even if you start to feel better. General instructions Do not have sex until the condition clears up, or until your health care provider approves. Keep your penis clean and dry. Take sitz baths as recommended by your health care provider. Avoid products that irritate your skin or make symptoms worse, such as soaps and shower gels that have fragrance. Keep all follow-up visits. This is  important. Contact a health care provider  if: Your symptoms get worse or do not improve with home care. You develop chills or a fever. You have trouble urinating. You cannot retract your foreskin. Get help right away if: You develop severe pain. You are unable to urinate. Summary Balanitis is swelling and irritation of the head of the penis (glans penis). This condition is most common among uncircumcised males. Balanitis causes pain, redness, and swelling of the glans penis. Good personal hygiene is important. Treatment may include improving personal hygiene and applying creams or ointments. Contact a health care provider if your symptoms get worse or do not improve with home care. This information is not intended to replace advice given to you by your health care provider. Make sure you discuss any questions you have with your health care provider. Document Revised: 05/20/2021 Document Reviewed: 05/20/2021 Elsevier Patient Education  2024 [employer].

## 2024-02-28 DIAGNOSIS — Z85828 Personal history of other malignant neoplasm of skin: Secondary | ICD-10-CM | POA: Diagnosis not present

## 2024-02-28 DIAGNOSIS — C4442 Squamous cell carcinoma of skin of scalp and neck: Secondary | ICD-10-CM | POA: Diagnosis not present

## 2024-02-28 DIAGNOSIS — L820 Inflamed seborrheic keratosis: Secondary | ICD-10-CM | POA: Diagnosis not present

## 2024-02-28 DIAGNOSIS — D044 Carcinoma in situ of skin of scalp and neck: Secondary | ICD-10-CM | POA: Diagnosis not present

## 2024-04-25 DIAGNOSIS — G4733 Obstructive sleep apnea (adult) (pediatric): Secondary | ICD-10-CM | POA: Diagnosis not present

## 2024-05-02 ENCOUNTER — Encounter: Payer: Self-pay | Admitting: Urology

## 2024-05-02 ENCOUNTER — Ambulatory Visit: Admitting: Urology

## 2024-05-02 ENCOUNTER — Ambulatory Visit: Payer: Medicare PPO | Admitting: Urology

## 2024-05-02 VITALS — BP 183/111 | HR 81

## 2024-05-02 DIAGNOSIS — N478 Other disorders of prepuce: Secondary | ICD-10-CM

## 2024-05-02 DIAGNOSIS — N3281 Overactive bladder: Secondary | ICD-10-CM | POA: Diagnosis not present

## 2024-05-02 LAB — BLADDER SCAN AMB NON-IMAGING: Scan Result: 89

## 2024-05-02 MED ORDER — FLUCONAZOLE 100 MG PO TABS: 100.0000 mg | ORAL_TABLET | Freq: Every day | ORAL | 0 refills | Status: AC

## 2024-05-02 MED ORDER — NYSTATIN-TRIAMCINOLONE 100000-0.1 UNIT/GM-% EX OINT: 1.0000 | TOPICAL_OINTMENT | Freq: Two times a day (BID) | CUTANEOUS | 0 refills | Status: DC

## 2024-05-02 MED ORDER — TROSPIUM CHLORIDE ER 60 MG PO CP24: 1.0000 | ORAL_CAPSULE | Freq: Every day | ORAL | 11 refills | Status: DC

## 2024-05-02 NOTE — Progress Notes (Signed)
post void residual= 89 ?

## 2024-05-02 NOTE — Progress Notes (Signed)
 05/02/2024 8:30 AM   Tyler Sparks 10/23/1941 [id number]  Referring provider: Barnetta Liberty, MD [address] [place],  [place] [address]  Followup OAB   HPI: Tyler Sparks is a 82yo here for followup for for OAB and balanitis. He was placed on sanctura  last visit. He notes since starting tropsium his urinary frequency has decreased and he can ride in the car for several hours without needing to go to the bathroom. He notes his foreskin became more inflamed and he was applying clotrimazole  without improvement.    PMH: Past Medical History:  Diagnosis Date   Abdominal aortic aneurysm (AAA) (HCC) 2017   Per patient he has AAA and has regular scans with Cardiology following   Atrial fibrillation (HCC)    2008   Cancer [facility])    skin   Cholecystitis 10/2018   Gout    HTN (hypertension)    Hyperlipidemia    Nephrolithiasis    Peripheral neuropathy    Sleep apnea     Surgical History: Past Surgical History:  Procedure Laterality Date   ARM SKIN LESION BIOPSY / EXCISION Right 8 15   CHOLECYSTECTOMY N/A 11/01/2018   Procedure: LAPAROSCOPIC CHOLECYSTECTOMY;  Surgeon: Adalberto Acton, MD;  Location: [facility] OR;  Service: General;  Laterality: N/A;   HERNIA REPAIR  1974   SKIN CANCER EXCISION  2008, 07/2014   TONSILECTOMY/ADENOIDECTOMY WITH MYRINGOTOMY  1950   WISDOM TOOTH EXTRACTION      Home Medications:  Allergies as of 05/02/2024       Reactions   Peanuts [peanut Oil] Anaphylaxis   Egg-derived Products Hives, Itching, Swelling        Medication List        Accurate as of May 02, 2024  8:30 AM. If you have any questions, ask your nurse or doctor.          allopurinol  300 MG tablet Commonly known as: ZYLOPRIM  Take 300 mg by mouth at bedtime.   apixaban  5 MG Tabs tablet Commonly known as: Eliquis  Take 1 tablet (5 mg total) by mouth 2 (two) times daily.   ascorbic acid  500 MG tablet Commonly known as: VITAMIN C  Take 500 mg by mouth daily.   atenolol  25  MG tablet Commonly known as: TENORMIN  Take 25 mg by mouth daily.   atorvastatin  10 MG tablet Commonly known as: LIPITOR Take 10 mg by mouth at bedtime.   clotrimazole -betamethasone  cream Commonly known as: Lotrisone  Apply 1 Application topically 2 (two) times daily.   co-enzyme Q-10 50 MG capsule Take 50 mg by mouth daily.   colchicine  0.6 MG tablet Take 0.6 mg by mouth daily as needed (FOR GOUT FLARES).   diltiazem  180 MG 24 hr capsule Commonly known as: CARDIZEM  CD Take 180 mg by mouth daily.   Fish Oil 1000 MG Caps Take 1,000 mg by mouth daily.   furosemide  40 MG tablet Commonly known as: LASIX  Take 1 tablet (40 mg total) by mouth daily as needed.   latanoprost 0.005 % ophthalmic solution Commonly known as: XALATAN   loratadine 10 MG tablet Commonly known as: CLARITIN Take 10 mg by mouth daily.   Melatonin 3 MG Caps Take 3 mg by mouth at bedtime.   multivitamin tablet Take 1 tablet by mouth daily.   Myrbetriq  50 MG Tb24 tablet Generic drug: mirabegron  ER TAKE 1 TABLET BY MOUTH DAILY   OSTEO BI-FLEX JOINT SHIELD PO Take 1 tablet by mouth 2 (two) times daily.   Trospium  Chloride 60 MG Cp24  Take 1 capsule (60 mg total) by mouth daily.   valsartan -hydrochlorothiazide  320-12.5 MG tablet Commonly known as: DIOVAN -HCT Take 1 tablet by mouth daily.   vitamin E  1000 UNIT capsule Take 1,000 Units by mouth daily.        Allergies:  Allergies  Allergen Reactions   Peanuts [Peanut Oil] Anaphylaxis   Egg-Derived Products Hives, Itching and Swelling    Family History: Family History  Problem Relation Age of Onset   Heart attack Father    Heart attack Mother    Colon cancer Neg Hx    Esophageal cancer Neg Hx    Rectal cancer Neg Hx    Stomach cancer Neg Hx     Social History:  reports that he quit smoking about 50 years ago. His smoking use included cigarettes. He started smoking about 64 years ago. He has a 14 pack-year smoking history. He has  never used smokeless tobacco. He reports current alcohol  use of about 14.0 standard drinks of alcohol  per week. He reports that he does not use drugs.  ROS: All other review of systems were reviewed and are negative except what is noted above in HPI  Physical Exam: BP (!) 183/111   Pulse 81   Constitutional:  Alert and oriented, No acute distress. HEENT: [place] AT, moist mucus membranes.  Trachea midline, no masses. Cardiovascular: No clubbing, cyanosis, or edema. Respiratory: Normal respiratory effort, no increased work of breathing. GI: Abdomen is soft, nontender, nondistended, no abdominal masses GU: No CVA tenderness. Balanitis prosent Lymph: No cervical or inguinal lymphadenopathy. Skin: No rashes, bruises or suspicious lesions. Neurologic: Grossly intact, no focal deficits, moving all 4 extremities. Psychiatric: Normal mood and affect.  Laboratory Data: Lab Results  Component Value Date   WBC 4.0 04/29/2023   HGB 14.3 04/29/2023   HCT 42.2 04/29/2023   MCV 97 04/29/2023   PLT 158 04/29/2023    Lab Results  Component Value Date   CREATININE 1.01 06/03/2023    No results found for: "PSA"  No results found for: "TESTOSTERONE"  Lab Results  Component Value Date   HGBA1C 5.7 (H) 10/28/2019    Urinalysis    Component Value Date/Time   COLORURINE YELLOW 10/27/2019 1516   APPEARANCEUR Clear 02/01/2024 1451   LABSPEC 1.016 10/27/2019 1516   PHURINE 7.0 10/27/2019 1516   GLUCOSEU Negative 02/01/2024 1451   HGBUR NEGATIVE 10/27/2019 1516   BILIRUBINUR Negative 02/01/2024 1451   KETONESUR 5 (A) 10/27/2019 1516   PROTEINUR Negative 02/01/2024 1451   PROTEINUR NEGATIVE 10/27/2019 1516   NITRITE Negative 02/01/2024 1451   NITRITE NEGATIVE 10/27/2019 1516   LEUKOCYTESUR Negative 02/01/2024 1451   LEUKOCYTESUR NEGATIVE 10/27/2019 1516    Lab Results  Component Value Date   LABMICR Comment 02/01/2024   WBCUA None seen 10/20/2022   LABEPIT 0-10 10/20/2022   BACTERIA  None seen 10/20/2022    Pertinent Imaging:  No results found for this or any previous visit.  No results found for this or any previous visit.  No results found for this or any previous visit.  No results found for this or any previous visit.  No results found for this or any previous visit.  No results found for this or any previous visit.  No results found for this or any previous visit.  No results found for this or any previous visit.   Assessment & Plan:    1. OAB (overactive bladder) (Primary) Continue sanctura  60mg  daily - Urinalysis, Routine w reflex microscopic -  BLADDER SCAN AMB NON-IMAGING  2. Foreskin problem Nystatin  cream BID for 21 days Diflucan  100mg  daily for 7 days   No follow-ups on file.  Johnie Nailer, MD  [facility] [place]

## 2024-05-02 NOTE — Patient Instructions (Signed)

## 2024-05-10 ENCOUNTER — Encounter: Payer: Self-pay | Admitting: Cardiovascular Disease

## 2024-05-10 ENCOUNTER — Ambulatory Visit: Attending: Cardiovascular Disease | Admitting: Cardiovascular Disease

## 2024-05-10 VITALS — BP 142/96 | HR 72 | Ht 70.0 in | Wt 265.0 lb

## 2024-05-10 DIAGNOSIS — I4819 Other persistent atrial fibrillation: Secondary | ICD-10-CM

## 2024-05-10 DIAGNOSIS — I10 Essential (primary) hypertension: Secondary | ICD-10-CM

## 2024-05-10 DIAGNOSIS — I2510 Atherosclerotic heart disease of native coronary artery without angina pectoris: Secondary | ICD-10-CM | POA: Diagnosis not present

## 2024-05-10 DIAGNOSIS — I6523 Occlusion and stenosis of bilateral carotid arteries: Secondary | ICD-10-CM | POA: Diagnosis not present

## 2024-05-10 DIAGNOSIS — I7121 Aneurysm of the ascending aorta, without rupture: Secondary | ICD-10-CM | POA: Diagnosis not present

## 2024-05-10 NOTE — Patient Instructions (Signed)
 Medication Instructions:  No changes today. *If you need a refill on your cardiac medications before your next appointment, please call your pharmacy*  Lab Work: none If you have labs (blood work) drawn today and your tests are completely normal, you will receive your results only by: MyChart Message (if you have MyChart) OR A paper copy in the mail If you have any lab test that is abnormal or we need to change your treatment, we will call you to review the results.  Testing/Procedures: none  Follow-Up: At [facility], you and your health needs are our priority.  As part of our continuing mission to provide you with exceptional heart care, our providers are all part of one team.  This team includes your primary Cardiologist (physician) and Advanced Practice Providers or APPs (Physician Assistants and Nurse Practitioners) who all work together to provide you with the care you need, when you need it.  Your next appointment:   12 month(s)  Provider:   Antoinette Batman, MD     Other Instructions

## 2024-05-10 NOTE — Progress Notes (Signed)
 Chief Complaint  Patient presents with   Follow-up    PAF   History of Present Illness: 83 yo male with history of paroxysmal atrial fibrillation, HTN, HLD, gout, sleep apnea and thoracic aortic aneurysm here today for cardiac follow up. I met him in July 2015. He had onset of atrial fib with RVR in 2008 and was converted to sinus with IV diltiazem . He had maintained sinus on Cardizem  and atenolol  but had not been on long term anticoagulation. He was seen in August 2016 and was in atrial fibrillation. Xarelto  was started in 2016. Echo 10/15/15 with normal LV function, no valve disease. Plan was for rate control and anti-coagulation.  He was admitted to [facility] 10/27/19 with an acute CVA. Echo 10/28/19 with LVEF=55%, mild MR. Carotid artery dopplers 10/30/19 with mild bilateral carotid artery disease. Neurology changed his Xarelto  to Eliquis . He is known to have a mildly dilated aortic root, 4.3 cm by CTA August 2020 and stable with most recent CTA in June 2024.  He is here today for follow up. The patient denies any chest pain, dyspnea, palpitations, lower extremity edema, orthopnea, PND, dizziness, near syncope or syncope.    Primary Care Physician: Barnetta Liberty, MD  Past Medical History:  Diagnosis Date   Abdominal aortic aneurysm (AAA) (HCC) 2017   Per patient he has AAA and has regular scans with Cardiology following   Atrial fibrillation (HCC)    2008   Cancer [facility])    skin   Cholecystitis 10/2018   Gout    HTN (hypertension)    Hyperlipidemia    Nephrolithiasis    Peripheral neuropathy    Sleep apnea     Past Surgical History:  Procedure Laterality Date   ARM SKIN LESION BIOPSY / EXCISION Right 8 15   CHOLECYSTECTOMY N/A 11/01/2018   Procedure: LAPAROSCOPIC CHOLECYSTECTOMY;  Surgeon: Adalberto Acton, MD;  Location: [facility] OR;  Service: General;  Laterality: N/A;   HERNIA REPAIR  1974   SKIN CANCER EXCISION  2008, 07/2014   TONSILECTOMY/ADENOIDECTOMY WITH MYRINGOTOMY  1950    WISDOM TOOTH EXTRACTION      Current Outpatient Medications  Medication Sig Dispense Refill   allopurinol  (ZYLOPRIM ) 300 MG tablet Take 300 mg by mouth at bedtime.      apixaban  (ELIQUIS ) 5 MG TABS tablet Take 1 tablet (5 mg total) by mouth 2 (two) times daily. 180 tablet 3   atenolol  (TENORMIN ) 25 MG tablet Take 25 mg by mouth daily.     atorvastatin  (LIPITOR) 10 MG tablet Take 10 mg by mouth at bedtime.      clotrimazole -betamethasone  (LOTRISONE ) cream Apply 1 Application topically 2 (two) times daily. 30 g 3   co-enzyme Q-10 50 MG capsule Take 50 mg by mouth daily.     colchicine  0.6 MG tablet Take 0.6 mg by mouth daily as needed (FOR GOUT FLARES).      diltiazem  (CARDIZEM  CD) 180 MG 24 hr capsule Take 180 mg by mouth daily.     fluconazole  (DIFLUCAN ) 100 MG tablet Take 1 tablet (100 mg total) by mouth daily. X 7 days 7 tablet 0   furosemide  (LASIX ) 40 MG tablet Take 1 tablet (40 mg total) by mouth daily as needed. 90 tablet 3   latanoprost (XALATAN) 0.005 % ophthalmic solution      loratadine (CLARITIN) 10 MG tablet Take 10 mg by mouth daily.     Melatonin 3 MG CAPS Take 3 mg by mouth at bedtime.  Misc Natural Products (OSTEO BI-FLEX JOINT SHIELD PO) Take 1 tablet by mouth 2 (two) times daily.      Multiple Vitamin (MULTIVITAMIN) tablet Take 1 tablet by mouth daily.     nystatin -triamcinolone  ointment (MYCOLOG) Apply 1 Application topically 2 (two) times daily. 30 g 0   Omega-3 Fatty Acids (FISH OIL) 1000 MG CAPS Take 1,000 mg by mouth daily.      Trospium  Chloride 60 MG CP24 Take 1 capsule (60 mg total) by mouth daily. 30 capsule 11   valsartan -hydrochlorothiazide  (DIOVAN -HCT) 320-12.5 MG tablet Take 1 tablet by mouth daily.     vitamin C  (ASCORBIC ACID ) 500 MG tablet Take 500 mg by mouth daily.     vitamin E  1000 UNIT capsule Take 1,000 Units by mouth daily.     No current facility-administered medications for this visit.    Allergies  Allergen Reactions   Peanuts [Peanut  Oil] Anaphylaxis   Egg-Derived Products Hives, Itching and Swelling    Social History   Socioeconomic History   Marital status: Widowed    Spouse name: Not on file   Number of children: 2   Years of education: Not on file   Highest education level: Not on file  Occupational History   Occupation: Retired-Linguistics Professor [employer]  Tobacco Use   Smoking status: Former    Current packs/day: 0.00    Average packs/day: 1 pack/day for 14.0 years (14.0 ttl pk-yrs)    Types: Cigarettes    Start date: 12/27/1959    Quit date: 12/26/1973    Years since quitting: 50.4   Smokeless tobacco: Never  Vaping Use   Vaping status: Never Used  Substance and Sexual Activity   Alcohol  use: Yes    Alcohol /week: 14.0 standard drinks of alcohol     Types: 14 Standard drinks or equivalent per week    Comment: When at [employer] has two glasses of wine per night   Drug use: No   Sexual activity: Not Currently  Other Topics Concern   Not on file  Social History Narrative   Not on file   Social Drivers of Health   Financial Resource Strain: Not on file  Food Insecurity: Not on file  Transportation Needs: Not on file  Physical Activity: Not on file  Stress: Not on file  Social Connections: Not on file  Intimate Partner Violence: Not on file    Family History  Problem Relation Age of Onset   Heart attack Father    Heart attack Mother    Colon cancer Neg Hx    Esophageal cancer Neg Hx    Rectal cancer Neg Hx    Stomach cancer Neg Hx     Review of Systems:  As stated in the HPI and otherwise negative.   BP (!) 142/96   Pulse 72   Ht 5\' 10"  (1.778 m)   Wt 265 lb (120.2 kg)   SpO2 96%   BMI 38.02 kg/m   Physical Examination: General: Well developed, well nourished, NAD  HEENT: OP clear, mucus membranes moist  SKIN: warm, dry. No rashes. Neuro: No focal deficits  Musculoskeletal: Muscle strength 5/5 all ext  Psychiatric: Mood and affect normal  Neck: No JVD, no carotid bruits, no  thyromegaly, no lymphadenopathy.  Lungs:Clear bilaterally, no wheezes, rhonci, crackles Cardiovascular: Irreg irreg. No murmurs, gallops or rubs. Abdomen:Soft. Bowel sounds present. Non-tender.  Extremities: No lower extremity edema. Pulses are 2 + in the bilateral DP/PT.  EKG:  EKG is ordered today. The  ekg ordered today demonstrates  EKG Interpretation Date/Time:  Thursday May 10 2024 16:29:22 EDT Ventricular Rate:  72 PR Interval:    QRS Duration:  102 QT Interval:  406 QTC Calculation: 444 R Axis:   -17  Text Interpretation: Atrial fibrillation with premature ventricular or aberrantly conducted complexes Confirmed by Antoinette Batman [phone]) on 05/10/2024 4:33:38 PM    Recent Labs: 06/03/2023: BUN 24; Creatinine, Ser 1.01; Potassium 3.5; Sodium 148    Wt Readings from Last 3 Encounters:  05/10/24 265 lb (120.2 kg)  04/29/23 266 lb 9.6 oz (120.9 kg)  11/23/21 251 lb 12.8 oz (114.2 kg)    Assessment and Plan:   1. Persistent atrial fibrillation: He is in atrial fib today. Rate is controlled. Continue Cardizem , atenolol  and Eliquis .     2. Thoracic aortic aneurysm (ascending aorta): Stable mild dilation of ascending aorta at 4.3 cm by CTA June 2024.  Will repeat CTA in June 2026 given stability over the past 8 years.   3. Coronary artery calcification: Noted on CTA chest. He has no symptoms c/w angina and is very active. LIkely that this represents calcification of his artery wall only without significant luminal obstruction.   4. HTN: BP is well controlled at home. He has his blood pressure log with him today and I reviewed this. Continue current therapy.   5. Carotid artery disease: Mild bilateral disease by carotid artery dopplers November 2020. We discussed repeating his dopplers but he does not wish to do this.   6. History of CVA November 2020: Fully recovered. Continue Eliquis   7. Lower extremity edema: Likely some component of venous insufficiency and diastolic  CHF. Lasix  as needed.    Labs/ tests ordered today include  Orders Placed This Encounter  Procedures   EKG 12-Lead   Disposition:   F/U with me in 12 months   Signed, Antoinette Batman, MD 05/10/2024 4:54 PM    [facility] [address] [place], [place], [place]  [address] Phone: [phone]; Fax: [phone]

## 2024-05-17 ENCOUNTER — Other Ambulatory Visit: Payer: Self-pay | Admitting: Nurse Practitioner

## 2024-05-17 DIAGNOSIS — I4891 Unspecified atrial fibrillation: Secondary | ICD-10-CM

## 2024-05-17 NOTE — Telephone Encounter (Signed)
 Prescription refill request for Eliquis  received. Indication: AF Last office visit: 05/10/24  Beatrice Lin MD Scr: 1.01 on 06/03/23  Epic Age: 83 Weight: 120.2kg  Based on above findings Eliquis  5mg  twice daily is the appropriate dose.  Refill approved.

## 2024-05-21 DIAGNOSIS — H2513 Age-related nuclear cataract, bilateral: Secondary | ICD-10-CM | POA: Diagnosis not present

## 2024-05-21 DIAGNOSIS — H401431 Capsular glaucoma with pseudoexfoliation of lens, bilateral, mild stage: Secondary | ICD-10-CM | POA: Diagnosis not present

## 2024-05-22 ENCOUNTER — Encounter: Payer: Self-pay | Admitting: Cardiovascular Disease

## 2024-05-22 MED ORDER — FUROSEMIDE 40 MG PO TABS: 40.0000 mg | ORAL_TABLET | Freq: Every day | ORAL | 3 refills | Status: AC | PRN

## 2024-06-27 DIAGNOSIS — C4442 Squamous cell carcinoma of skin of scalp and neck: Secondary | ICD-10-CM | POA: Diagnosis not present

## 2024-06-27 DIAGNOSIS — D692 Other nonthrombocytopenic purpura: Secondary | ICD-10-CM | POA: Diagnosis not present

## 2024-06-27 DIAGNOSIS — D225 Melanocytic nevi of trunk: Secondary | ICD-10-CM | POA: Diagnosis not present

## 2024-06-27 DIAGNOSIS — L821 Other seborrheic keratosis: Secondary | ICD-10-CM | POA: Diagnosis not present

## 2024-06-27 DIAGNOSIS — I8312 Varicose veins of left lower extremity with inflammation: Secondary | ICD-10-CM | POA: Diagnosis not present

## 2024-06-27 DIAGNOSIS — L570 Actinic keratosis: Secondary | ICD-10-CM | POA: Diagnosis not present

## 2024-06-27 DIAGNOSIS — Z85828 Personal history of other malignant neoplasm of skin: Secondary | ICD-10-CM | POA: Diagnosis not present

## 2024-06-27 DIAGNOSIS — L814 Other melanin hyperpigmentation: Secondary | ICD-10-CM | POA: Diagnosis not present

## 2024-06-27 DIAGNOSIS — I872 Venous insufficiency (chronic) (peripheral): Secondary | ICD-10-CM | POA: Diagnosis not present

## 2024-06-27 DIAGNOSIS — I8311 Varicose veins of right lower extremity with inflammation: Secondary | ICD-10-CM | POA: Diagnosis not present

## 2024-07-11 DIAGNOSIS — C44229 Squamous cell carcinoma of skin of left ear and external auricular canal: Secondary | ICD-10-CM | POA: Diagnosis not present

## 2024-07-11 DIAGNOSIS — Z85828 Personal history of other malignant neoplasm of skin: Secondary | ICD-10-CM | POA: Diagnosis not present

## 2024-07-31 DIAGNOSIS — G4733 Obstructive sleep apnea (adult) (pediatric): Secondary | ICD-10-CM | POA: Diagnosis not present

## 2024-08-17 DIAGNOSIS — Z1389 Encounter for screening for other disorder: Secondary | ICD-10-CM | POA: Diagnosis not present

## 2024-08-17 DIAGNOSIS — E785 Hyperlipidemia, unspecified: Secondary | ICD-10-CM | POA: Diagnosis not present

## 2024-08-17 DIAGNOSIS — I10 Essential (primary) hypertension: Secondary | ICD-10-CM | POA: Diagnosis not present

## 2024-08-17 DIAGNOSIS — M109 Gout, unspecified: Secondary | ICD-10-CM | POA: Diagnosis not present

## 2024-08-17 DIAGNOSIS — Z125 Encounter for screening for malignant neoplasm of prostate: Secondary | ICD-10-CM | POA: Diagnosis not present

## 2024-08-28 DIAGNOSIS — E785 Hyperlipidemia, unspecified: Secondary | ICD-10-CM | POA: Diagnosis not present

## 2024-08-28 DIAGNOSIS — I480 Paroxysmal atrial fibrillation: Secondary | ICD-10-CM | POA: Diagnosis not present

## 2024-08-28 DIAGNOSIS — N1831 Chronic kidney disease, stage 3a: Secondary | ICD-10-CM | POA: Diagnosis not present

## 2024-08-28 DIAGNOSIS — Z0000 Encounter for general adult medical examination without abnormal findings: Secondary | ICD-10-CM | POA: Diagnosis not present

## 2024-08-28 DIAGNOSIS — R82998 Other abnormal findings in urine: Secondary | ICD-10-CM | POA: Diagnosis not present

## 2024-08-28 DIAGNOSIS — R7302 Impaired glucose tolerance (oral): Secondary | ICD-10-CM | POA: Diagnosis not present

## 2024-08-28 DIAGNOSIS — Z1331 Encounter for screening for depression: Secondary | ICD-10-CM | POA: Diagnosis not present

## 2024-08-28 DIAGNOSIS — I700 Atherosclerosis of aorta: Secondary | ICD-10-CM | POA: Diagnosis not present

## 2024-08-28 DIAGNOSIS — I872 Venous insufficiency (chronic) (peripheral): Secondary | ICD-10-CM | POA: Diagnosis not present

## 2024-08-28 DIAGNOSIS — Z1339 Encounter for screening examination for other mental health and behavioral disorders: Secondary | ICD-10-CM | POA: Diagnosis not present

## 2024-08-28 DIAGNOSIS — I129 Hypertensive chronic kidney disease with stage 1 through stage 4 chronic kidney disease, or unspecified chronic kidney disease: Secondary | ICD-10-CM | POA: Diagnosis not present

## 2024-08-28 DIAGNOSIS — I2510 Atherosclerotic heart disease of native coronary artery without angina pectoris: Secondary | ICD-10-CM | POA: Diagnosis not present

## 2024-08-28 DIAGNOSIS — Z6841 Body Mass Index (BMI) 40.0 and over, adult: Secondary | ICD-10-CM | POA: Diagnosis not present

## 2024-09-10 ENCOUNTER — Other Ambulatory Visit: Payer: Self-pay | Admitting: Urology

## 2024-10-31 ENCOUNTER — Encounter: Payer: Self-pay | Admitting: Urology

## 2024-10-31 ENCOUNTER — Ambulatory Visit: Admitting: Urology

## 2024-10-31 VITALS — BP 184/98 | HR 81

## 2024-10-31 DIAGNOSIS — N3281 Overactive bladder: Secondary | ICD-10-CM | POA: Diagnosis not present

## 2024-10-31 DIAGNOSIS — N478 Other disorders of prepuce: Secondary | ICD-10-CM

## 2024-10-31 LAB — URINALYSIS, ROUTINE W REFLEX MICROSCOPIC
Bilirubin, UA: NEGATIVE
Glucose, UA: NEGATIVE
Ketones, UA: NEGATIVE
Leukocytes,UA: NEGATIVE
Nitrite, UA: NEGATIVE
Protein,UA: NEGATIVE
RBC, UA: NEGATIVE
Specific Gravity, UA: 1.02 (ref 1.005–1.030)
Urobilinogen, Ur: 1 mg/dL (ref 0.2–1.0)
pH, UA: 5.5 (ref 5.0–7.5)

## 2024-10-31 MED ORDER — FLUCONAZOLE 100 MG PO TABS: 100.0000 mg | ORAL_TABLET | Freq: Every day | ORAL | 0 refills | Status: AC

## 2024-10-31 MED ORDER — NYSTATIN-TRIAMCINOLONE 100000-0.1 UNIT/GM-% EX OINT
TOPICAL_OINTMENT | Freq: Two times a day (BID) | CUTANEOUS | 3 refills | Status: AC
Start: 2024-10-31 — End: ?

## 2024-10-31 MED ORDER — TROSPIUM CHLORIDE ER 60 MG PO CP24: 1.0000 | ORAL_CAPSULE | Freq: Every day | ORAL | 11 refills | Status: AC

## 2024-10-31 NOTE — Progress Notes (Signed)
 10/31/2024 11:13 AM   Tyler Sparks June 05, 1941 [id number]  Referring provider: Larnell Hamilton, MD [address],  [place] [address]  Followup OAB   HPI: Tyler Sparks is a 83yo here for followup for OAB and balanitis. He notes his urinary urgency and frequency is significantly improved on tropsium. He notes the balanitis improved initially after diflucan  100mg  daily for 7 days and then returned. He notes the skin is fusing over the glans. He uses nystatin  cream daily.    PMH: Past Medical History:  Diagnosis Date   Abdominal aortic aneurysm (AAA) 2017   Per patient he has AAA and has regular scans with Cardiology following   Atrial fibrillation (HCC)    2008   Cancer [facility])    skin   Cholecystitis 10/2018   Gout    HTN (hypertension)    Hyperlipidemia    Nephrolithiasis    Peripheral neuropathy    Sleep apnea     Surgical History: Past Surgical History:  Procedure Laterality Date   ARM SKIN LESION BIOPSY / EXCISION Right 8 15   CHOLECYSTECTOMY N/A 11/01/2018   Procedure: LAPAROSCOPIC CHOLECYSTECTOMY;  Surgeon: Signe Mitzie LABOR, MD;  Location: [facility] OR;  Service: General;  Laterality: N/A;   HERNIA REPAIR  1974   SKIN CANCER EXCISION  2008, 07/2014   TONSILECTOMY/ADENOIDECTOMY WITH MYRINGOTOMY  1950   WISDOM TOOTH EXTRACTION      Home Medications:  Allergies as of 10/31/2024       Reactions   Peanuts [peanut Oil] Anaphylaxis   Egg Protein-containing Drug Products Hives, Itching, Swelling        Medication List        Accurate as of October 31, 2024 11:13 AM. If you have any questions, ask your nurse or doctor.          allopurinol  300 MG tablet Commonly known as: ZYLOPRIM  Take 300 mg by mouth at bedtime.   ascorbic acid  500 MG tablet Commonly known as: VITAMIN C  Take 500 mg by mouth daily.   atenolol  25 MG tablet Commonly known as: TENORMIN  Take 25 mg by mouth daily.   atorvastatin  10 MG tablet Commonly known as: LIPITOR Take 10 mg  by mouth at bedtime.   clotrimazole -betamethasone  cream Commonly known as: Lotrisone  Apply 1 Application topically 2 (two) times daily.   co-enzyme Q-10 50 MG capsule Take 50 mg by mouth daily.   colchicine  0.6 MG tablet Take 0.6 mg by mouth daily as needed (FOR GOUT FLARES).   diltiazem  180 MG 24 hr capsule Commonly known as: CARDIZEM  CD Take 180 mg by mouth daily.   Eliquis  5 MG Tabs tablet Generic drug: apixaban  TAKE 1 TABLET BY MOUTH 2 TIMES A DAY   Fish Oil 1000 MG Caps Take 1,000 mg by mouth daily.   fluconazole  100 MG tablet Commonly known as: DIFLUCAN  Take 1 tablet (100 mg total) by mouth daily. X 7 days   furosemide  40 MG tablet Commonly known as: LASIX  Take 1 tablet (40 mg total) by mouth daily as needed.   latanoprost 0.005 % ophthalmic solution Commonly known as: XALATAN   loratadine 10 MG tablet Commonly known as: CLARITIN Take 10 mg by mouth daily.   Melatonin 3 MG Caps Take 3 mg by mouth at bedtime.   multivitamin tablet Take 1 tablet by mouth daily.   nystatin -triamcinolone  ointment Commonly known as: MYCOLOG APPLY TO THE AFFECTED AREA(S) 2 TIMES A DAY   OSTEO BI-FLEX JOINT SHIELD PO Take 1 tablet by mouth  2 (two) times daily.   Trospium  Chloride 60 MG Cp24 Take 1 capsule (60 mg total) by mouth daily.   valsartan -hydrochlorothiazide  320-12.5 MG tablet Commonly known as: DIOVAN -HCT Take 1 tablet by mouth daily.   vitamin E  1000 UNIT capsule Take 1,000 Units by mouth daily.        Allergies:  Allergies  Allergen Reactions   Peanuts [Peanut Oil] Anaphylaxis   Egg Protein-Containing Drug Products Hives, Itching and Swelling    Family History: Family History  Problem Relation Age of Onset   Heart attack Father    Heart attack Mother    Colon cancer Neg Hx    Esophageal cancer Neg Hx    Rectal cancer Neg Hx    Stomach cancer Neg Hx     Social History:  reports that he quit smoking about 50 years ago. His smoking use included  cigarettes. He started smoking about 64 years ago. He has a 14 pack-year smoking history. He has never used smokeless tobacco. He reports current alcohol  use of about 14.0 standard drinks of alcohol  per week. He reports that he does not use drugs.  ROS: All other review of systems were reviewed and are negative except what is noted above in HPI  Physical Exam: BP (!) 184/98   Pulse 81   Constitutional:  Alert and oriented, No acute distress. HEENT: [place] AT, moist mucus membranes.  Trachea midline, no masses. Cardiovascular: No clubbing, cyanosis, or edema. Respiratory: Normal respiratory effort, no increased work of breathing. GI: Abdomen is soft, nontender, nondistended, no abdominal masses GU: No CVA tenderness.  Lymph: No cervical or inguinal lymphadenopathy. Skin: No rashes, bruises or suspicious lesions. Neurologic: Grossly intact, no focal deficits, moving all 4 extremities. Psychiatric: Normal mood and affect.  Laboratory Data: Lab Results  Component Value Date   WBC 4.0 04/29/2023   HGB 14.3 04/29/2023   HCT 42.2 04/29/2023   MCV 97 04/29/2023   PLT 158 04/29/2023    Lab Results  Component Value Date   CREATININE 1.01 06/03/2023    No results found for: PSA  No results found for: TESTOSTERONE  Lab Results  Component Value Date   HGBA1C 5.7 (H) 10/28/2019    Urinalysis    Component Value Date/Time   COLORURINE YELLOW 10/27/2019 1516   APPEARANCEUR Clear 02/01/2024 1451   LABSPEC 1.016 10/27/2019 1516   PHURINE 7.0 10/27/2019 1516   GLUCOSEU Negative 02/01/2024 1451   HGBUR NEGATIVE 10/27/2019 1516   BILIRUBINUR Negative 02/01/2024 1451   KETONESUR 5 (A) 10/27/2019 1516   PROTEINUR Negative 02/01/2024 1451   PROTEINUR NEGATIVE 10/27/2019 1516   NITRITE Negative 02/01/2024 1451   NITRITE NEGATIVE 10/27/2019 1516   LEUKOCYTESUR Negative 02/01/2024 1451   LEUKOCYTESUR NEGATIVE 10/27/2019 1516    Lab Results  Component Value Date   LABMICR Comment  02/01/2024   WBCUA None seen 10/20/2022   LABEPIT 0-10 10/20/2022   BACTERIA None seen 10/20/2022    Pertinent Imaging:  No results found for this or any previous visit.  No results found for this or any previous visit.  No results found for this or any previous visit.  No results found for this or any previous visit.  No results found for this or any previous visit.  No results found for this or any previous visit.  No results found for this or any previous visit.  No results found for this or any previous visit.   Assessment & Plan:    1. OAB (overactive bladder) (  Primary) Continue trospium  60mg  daily - Urinalysis, Routine w reflex microscopic  2. Foreskin problem Continue nystatin  cream Diflucan  100mg  daily for 7 days   No follow-ups on file.  Belvie Clara, MD  [facility] [place]

## 2024-10-31 NOTE — Patient Instructions (Signed)
 Circumcision, Adult  Circumcision is a surgery to remove or cut the foreskin of the penis. When the foreskin is cut but not removed, it is called a dorsal circumcision. This makes the end of the foreskin loose so it can be pulled back over the head of the penis. Tell your doctor about: Any allergies you have. All medicines you are taking. This includes vitamins, herbs, eye drops, creams, and over-the-counter medicines. Any problems you or family members have had with anesthetic medicines. Any bleeding problems you have. Any surgeries you have had. Any medical conditions you have. Any recent colds or infections. What are the risks? This surgery is safe. But problems may occur. These include: Bleeding. Pain. Infection. Allergies. Opening of the wound. This can happen if you have an erection after surgery. What happens before the procedure? When to stop eating and drinking Follow instructions from your doctor about what you may eat and drink before your procedure. These may include: 8 hours before your procedure Stop eating most foods. Do not eat meat, fried foods, or fatty foods. Eat only light foods, such as toast or crackers. All liquids are okay except energy drinks and alcohol. 6 hours before your procedure Stop eating. Drink only clear liquids, such as water, clear fruit juice, black coffee, plain tea, and sports drinks. Do not drink energy drinks or alcohol. 2 hours before your procedure Stop drinking all liquids. You may be allowed to take medicines with small sips of water. If you do not follow your doctor's instructions, your procedure may be delayed or canceled. Medicines Ask your doctor about changing or stopping: Your normal medicines. Vitamins, herbs, and supplements. Over-the-counter medicines. Do not take aspirin or ibuprofen unless you are told to. General instructions  Do not smoke or use any products that contain nicotine or tobacco for 4 weeks before the  procedure. If you need help quitting, ask your doctor. If you will be going home right after the procedure, plan to have a responsible adult: Take you home from the hospital or clinic. You will not be allowed to drive. Care for you for the time you are told. For your safety, your doctor may: Loraine Leriche the area of surgery. Ask you to wash with a soap that kills germs. Give you antibiotic medicine. What happens during the procedure? An IV tube may be put into one of your veins. You may be given a medicine: To help you relax. To make you feel numb in the penis. A cut (incision) will be made to remove or cut the foreskin. Stitches may be used to close the cut area. A bandage will be put over the penis. The IV tube will be removed. The procedure may vary among doctors and hospitals. What happens after the procedure? You will be monitored until you leave the hospital or clinic. This includes checking your: Blood pressure. Heart rate. Breathing rate. Blood oxygen level. Do not get out of bed until your doctor says it is okay. Do not drive or use machines until your doctor says that it is safe. Summary Circumcision is a surgery to remove or cut the foreskin of the penis. Your doctor will tell you what you may eat and drink before your procedure. Ask your doctor if you will be going home right after surgery. If so, plan to have an adult care for you for the time you are told. Stitches may be used to close the cut area. This information is not intended to replace advice given to you  by your health care provider. Make sure you discuss any questions you have with your health care provider. Document Revised: 12/07/2021 Document Reviewed: 12/07/2021 Elsevier Patient Education  2024 [employer].

## 2024-11-05 ENCOUNTER — Telehealth: Payer: Self-pay | Admitting: Cardiovascular Disease

## 2024-11-05 ENCOUNTER — Other Ambulatory Visit: Payer: Self-pay

## 2024-11-05 DIAGNOSIS — I4891 Unspecified atrial fibrillation: Secondary | ICD-10-CM

## 2024-11-05 DIAGNOSIS — Z79899 Other long term (current) drug therapy: Secondary | ICD-10-CM

## 2024-11-05 NOTE — Telephone Encounter (Deleted)
 Prescription refill request for Eliquis  received. Indication:afib Last office visit:5/25 Scr: Age:  Weight:

## 2024-11-05 NOTE — Telephone Encounter (Signed)
*  STAT* If patient is at the pharmacy, call can be transferred to refill team.   1. Which medications need to be refilled? (please list name of each medication and dose if known) apixaban  (ELIQUIS ) 5 MG TABS tablet    2. Would you like to learn more about the convenience, safety, & potential cost savings by using the [facility]? No    3. Are you open to using the [facility] (Type [facility]. No    4. Which pharmacy/location (including street and city if local pharmacy) is medication to be sent to?[facility] [id number] - [place], [place] - [address]    5. Do they need a 30 day or 90 day supply? 90 day   Pt is almost out of medication.

## 2024-11-06 MED ORDER — APIXABAN 5 MG PO TABS: 5.0000 mg | ORAL_TABLET | Freq: Two times a day (BID) | ORAL | 1 refills | Status: AC

## 2024-11-06 NOTE — Telephone Encounter (Signed)
 Prescription refill request for Eliquis  received. Indication:afib Last office visit:5/25 Scr:1.3  2025 Age: 83 Weight:120.2  kg  Prescription refilled

## 2024-11-06 NOTE — Telephone Encounter (Signed)
 Patient is returning call.

## 2024-11-06 NOTE — Telephone Encounter (Signed)
 In the time of me waiting for labs, someone went ahead and got labs and sent in the refill.  Pt has been made aware.

## 2024-11-06 NOTE — Telephone Encounter (Signed)
 Pt requesting refill for Eliquis .  Last BMET is 05/2023.  I have searched [facility] and [facility], cannot find a current one.  Pt will need a BMET before refill can be sent.  I have left pt a message to call back.

## 2024-11-06 NOTE — Telephone Encounter (Signed)
 Pt returned my call.  He had lab work done 08/17/24.  I had already called [facility] to request, hadn't received.  Pt will call and have them fax me the recent BMET from 08/17/24.  Pt aware when we get lab result, will send refill for Eliquis .

## 2024-11-20 DIAGNOSIS — H2513 Age-related nuclear cataract, bilateral: Secondary | ICD-10-CM | POA: Diagnosis not present

## 2024-11-20 DIAGNOSIS — H401431 Capsular glaucoma with pseudoexfoliation of lens, bilateral, mild stage: Secondary | ICD-10-CM | POA: Diagnosis not present

## 2024-11-21 ENCOUNTER — Encounter: Payer: Self-pay | Admitting: Urology

## 2024-11-27 ENCOUNTER — Other Ambulatory Visit: Payer: Self-pay

## 2025-01-08 ENCOUNTER — Telehealth (HOSPITAL_BASED_OUTPATIENT_CLINIC_OR_DEPARTMENT_OTHER): Payer: Self-pay

## 2025-01-08 ENCOUNTER — Other Ambulatory Visit: Payer: Self-pay | Admitting: Urology

## 2025-01-08 DIAGNOSIS — N478 Other disorders of prepuce: Secondary | ICD-10-CM

## 2025-01-08 NOTE — Telephone Encounter (Signed)
"  ° °  Pre-operative Risk Assessment    Patient Name: Tyler Sparks  DOB: June 14, 1941 MRN: [id number]   Date of last office visit: 05/10/24 with  Verlin  Date of next office visit: NA  Request for Surgical Clearance    Procedure:  Circumcision   Date of Surgery:  Clearance 02/21/25                                  Surgeon:  Dr. Sherrilee Surgeon's Group or Practice Name:  [facility] [place] Phone number:  [phone] Fax number:  [phone]   Type of Clearance Requested:   - Medical  - Pharmacy:  Hold Apixaban  (Eliquis ) not indicated   Type of Anesthesia:  General    Additional requests/questions:    [name]   01/08/2025, 3:28 PM   "

## 2025-01-14 DIAGNOSIS — N1831 Chronic kidney disease, stage 3a: Secondary | ICD-10-CM | POA: Insufficient documentation

## 2025-01-14 DIAGNOSIS — I6529 Occlusion and stenosis of unspecified carotid artery: Secondary | ICD-10-CM | POA: Insufficient documentation

## 2025-01-14 DIAGNOSIS — I2510 Atherosclerotic heart disease of native coronary artery without angina pectoris: Secondary | ICD-10-CM | POA: Insufficient documentation

## 2025-01-14 DIAGNOSIS — I872 Venous insufficiency (chronic) (peripheral): Secondary | ICD-10-CM | POA: Insufficient documentation

## 2025-01-14 DIAGNOSIS — I700 Atherosclerosis of aorta: Secondary | ICD-10-CM | POA: Insufficient documentation

## 2025-01-14 NOTE — Telephone Encounter (Signed)
 Patient with diagnosis of A Fib on Eliquis  for anticoagulation.    Procedure: Circumcision  Date of procedure: 02/21/25   CHA2DS2-VASc Score = 6  This indicates a 9.7% annual risk of stroke. The patient's score is based upon: CHF History: 0 HTN History: 1 Diabetes History: 0 Stroke History: 2 Vascular Disease History: 1 Age Score: 2 Gender Score: 0     CrCl 74 ml/min Platelet count 179k  Patient has not had an Afib/aflutter ablation in the last 3 months, DCCV within the last 4 weeks or a watchman implanted in the last 45 days   Per office protocol, patient can hold Eliquis  for 2 days prior to procedure.     **This guidance is not considered finalized until pre-operative APP has relayed final recommendations.**

## 2025-01-15 NOTE — Telephone Encounter (Signed)
" ° °  Name: Tyler Sparks  DOB: [birth year]  MRN: [id number]  Primary Cardiologist: Lonni Cash, MD   Preoperative team, please contact this patient and set up a phone call appointment for further preoperative risk assessment. Please obtain consent and complete medication review. Thank you for your help.  I confirm that guidance regarding antiplatelet and oral anticoagulation therapy has been completed and, if necessary, noted below. - Per office protocol, patient can hold Eliquis  for 2 days prior to procedure.   I also confirmed the patient resides in the state of [place] . As per [facility] Medical [employer] telemedicine laws, the patient must reside in the state in which the provider is licensed.   Clary Meeker E Randall Rampersad, PA-C 01/15/2025, 9:14 PM [facility]    "

## 2025-01-16 ENCOUNTER — Telehealth: Payer: Self-pay

## 2025-01-16 NOTE — Telephone Encounter (Signed)
"  °  Patient Consent for Virtual Visit         Tyler Sparks has provided verbal consent on 01/16/2025 for a virtual visit (video or telephone).  CONSENT FOR VIRTUAL VISIT FOR:  Tyler Sparks  By participating in this virtual visit I agree to the following:  I hereby voluntarily request, consent and authorize [facility] and its employed or contracted physicians, physician assistants, nurse practitioners or other licensed health care professionals (the Practitioner), to provide me with telemedicine health care services (the Services) as deemed necessary by the treating Practitioner. I acknowledge and consent to receive the Services by the Practitioner via telemedicine. I understand that the telemedicine visit will involve communicating with the Practitioner through live audiovisual communication technology and the disclosure of certain medical information by electronic transmission. I acknowledge that I have been given the opportunity to request an in-person assessment or other available alternative prior to the telemedicine visit and am voluntarily participating in the telemedicine visit.  I understand that I have the right to withhold or withdraw my consent to the use of telemedicine in the course of my care at any time, without affecting my right to future care or treatment, and that the Practitioner or I may terminate the telemedicine visit at any time. I understand that I have the right to inspect all information obtained and/or recorded in the course of the telemedicine visit and may receive copies of available information for a reasonable fee.  I understand that some of the potential risks of receiving the Services via telemedicine include:  Delay or interruption in medical evaluation due to technological equipment failure or disruption; Information transmitted may not be sufficient (e.g. poor resolution of images) to allow for appropriate medical decision making by the Practitioner;  and/or  In rare instances, security protocols could fail, causing a breach of personal health information.  Furthermore, I acknowledge that it is my responsibility to provide information about my medical history, conditions and care that is complete and accurate to the best of my ability. I acknowledge that Practitioner's advice, recommendations, and/or decision may be based on factors not within their control, such as incomplete or inaccurate data provided by me or distortions of diagnostic images or specimens that may result from electronic transmissions. I understand that the practice of medicine is not an exact science and that Practitioner makes no warranties or guarantees regarding treatment outcomes. I acknowledge that a copy of this consent can be made available to me via my patient portal [facility] MyChart), or I can request a printed copy by calling the office of  [facility].    I understand that my insurance will be billed for this visit.   I have read or had this consent read to me. I understand the contents of this consent, which adequately explains the benefits and risks of the Services being provided via telemedicine.  I have been provided ample opportunity to ask questions regarding this consent and the Services and have had my questions answered to my satisfaction. I give my informed consent for the services to be provided through the use of telemedicine in my medical care    "

## 2025-01-16 NOTE — Telephone Encounter (Signed)
 Appointment is scheduled for 02/12/25 at 9am. Med req and consent are complete. Call patient at [phone].

## 2025-02-12 ENCOUNTER — Ambulatory Visit

## 2025-05-03 ENCOUNTER — Ambulatory Visit: Admitting: Urology
